# Patient Record
Sex: Female | Born: 1955 | Race: Black or African American | Hispanic: No | Marital: Married | Smoking: Never smoker
Health system: Southern US, Community
[De-identification: ages and names within clinical notes are randomized; demographics above are authoritative.]

## PROBLEM LIST (undated history)

## (undated) DIAGNOSIS — E669 Obesity, unspecified: Secondary | ICD-10-CM

## (undated) DIAGNOSIS — E785 Hyperlipidemia, unspecified: Secondary | ICD-10-CM

## (undated) DIAGNOSIS — G709 Myoneural disorder, unspecified: Secondary | ICD-10-CM

## (undated) DIAGNOSIS — I10 Essential (primary) hypertension: Secondary | ICD-10-CM

## (undated) DIAGNOSIS — E119 Type 2 diabetes mellitus without complications: Secondary | ICD-10-CM

## (undated) HISTORY — DX: Myoneural disorder, unspecified: G70.9

## (undated) HISTORY — DX: Obesity, unspecified: E66.9

## (undated) HISTORY — PX: CHOLECYSTECTOMY: SHX55

---

## 2000-02-04 DIAGNOSIS — E1159 Type 2 diabetes mellitus with other circulatory complications: Secondary | ICD-10-CM | POA: Insufficient documentation

## 2000-02-04 DIAGNOSIS — I152 Hypertension secondary to endocrine disorders: Secondary | ICD-10-CM | POA: Insufficient documentation

## 2005-12-03 DIAGNOSIS — D649 Anemia, unspecified: Secondary | ICD-10-CM | POA: Insufficient documentation

## 2005-12-25 DIAGNOSIS — G4700 Insomnia, unspecified: Secondary | ICD-10-CM | POA: Insufficient documentation

## 2006-01-01 ENCOUNTER — Ambulatory Visit: Payer: Self-pay | Admitting: Family Medicine

## 2006-01-08 ENCOUNTER — Ambulatory Visit: Payer: Self-pay | Admitting: Family Medicine

## 2006-07-22 ENCOUNTER — Ambulatory Visit: Payer: Self-pay | Admitting: Family Medicine

## 2007-08-03 ENCOUNTER — Ambulatory Visit: Payer: Self-pay | Admitting: Family Medicine

## 2008-07-17 ENCOUNTER — Emergency Department: Payer: Self-pay | Admitting: Internal Medicine

## 2010-03-08 DIAGNOSIS — E7800 Pure hypercholesterolemia, unspecified: Secondary | ICD-10-CM | POA: Insufficient documentation

## 2010-03-28 ENCOUNTER — Ambulatory Visit: Payer: Self-pay

## 2010-03-28 LAB — HM MAMMOGRAPHY

## 2010-04-02 ENCOUNTER — Ambulatory Visit: Payer: Self-pay | Admitting: Gastroenterology

## 2010-04-02 LAB — HM COLONOSCOPY

## 2010-04-29 ENCOUNTER — Emergency Department: Payer: Self-pay | Admitting: Emergency Medicine

## 2010-08-17 ENCOUNTER — Ambulatory Visit: Payer: Self-pay

## 2010-08-30 ENCOUNTER — Ambulatory Visit: Payer: Self-pay | Admitting: Anesthesiology

## 2010-08-31 ENCOUNTER — Ambulatory Visit: Payer: Self-pay | Admitting: Unknown Physician Specialty

## 2012-03-17 ENCOUNTER — Emergency Department: Payer: Self-pay | Admitting: Emergency Medicine

## 2012-04-10 ENCOUNTER — Ambulatory Visit: Payer: Self-pay | Admitting: Orthopedic Surgery

## 2012-08-25 HISTORY — PX: REPLACEMENT TOTAL KNEE: SUR1224

## 2012-09-10 ENCOUNTER — Ambulatory Visit: Payer: Self-pay | Admitting: Orthopedic Surgery

## 2012-09-10 LAB — CBC
HCT: 37.9 % (ref 35.0–47.0)
HGB: 12.3 g/dL (ref 12.0–16.0)
MCH: 27.5 pg (ref 26.0–34.0)
MCHC: 32.6 g/dL (ref 32.0–36.0)
MCV: 84 fL (ref 80–100)
Platelet: 250 10*3/uL (ref 150–440)
RBC: 4.49 10*6/uL (ref 3.80–5.20)
RDW: 14.5 % (ref 11.5–14.5)
WBC: 5.2 10*3/uL (ref 3.6–11.0)

## 2012-09-10 LAB — MRSA PCR SCREENING

## 2012-09-10 LAB — BASIC METABOLIC PANEL
Anion Gap: 9 (ref 7–16)
BUN: 14 mg/dL (ref 7–18)
Calcium, Total: 9.6 mg/dL (ref 8.5–10.1)
Chloride: 103 mmol/L (ref 98–107)
Co2: 28 mmol/L (ref 21–32)
Creatinine: 0.89 mg/dL (ref 0.60–1.30)
EGFR (African American): >60
EGFR (Non-African Amer.): >60
Glucose: 94 mg/dL (ref 65–99)
Osmolality: 280 (ref 275–301)
Potassium: 3.8 mmol/L (ref 3.5–5.1)
Sodium: 140 mmol/L (ref 136–145)

## 2012-09-10 LAB — PROTIME-INR
INR: 1
Prothrombin Time: 13.1 secs (ref 11.5–14.7)

## 2012-09-10 LAB — APTT: Activated PTT: 28.4 secs (ref 23.6–35.9)

## 2012-09-10 LAB — SEDIMENTATION RATE: Erythrocyte Sed Rate: 24 mm/hr (ref 0–30)

## 2012-09-22 ENCOUNTER — Inpatient Hospital Stay: Payer: Self-pay | Admitting: Orthopedic Surgery

## 2012-09-23 LAB — HEMOGLOBIN: HGB: 10.4 g/dL — ABNORMAL LOW (ref 12.0–16.0)

## 2012-09-23 LAB — BASIC METABOLIC PANEL
Anion Gap: 9 (ref 7–16)
BUN: 14 mg/dL (ref 7–18)
Calcium, Total: 8.8 mg/dL (ref 8.5–10.1)
Chloride: 102 mmol/L (ref 98–107)
Co2: 27 mmol/L (ref 21–32)
Creatinine: 0.92 mg/dL (ref 0.60–1.30)
EGFR (African American): >60
EGFR (Non-African Amer.): >60
Glucose: 135 mg/dL — ABNORMAL HIGH (ref 65–99)
Osmolality: 278 (ref 275–301)
Potassium: 4.1 mmol/L (ref 3.5–5.1)
Sodium: 138 mmol/L (ref 136–145)

## 2012-09-23 LAB — PLATELET COUNT: Platelet: 218 10*3/uL (ref 150–440)

## 2012-09-24 LAB — HEMOGLOBIN: HGB: 11 g/dL — ABNORMAL LOW (ref 12.0–16.0)

## 2012-09-25 LAB — PATHOLOGY REPORT

## 2012-10-15 ENCOUNTER — Ambulatory Visit: Payer: Self-pay | Admitting: Orthopedic Surgery

## 2013-04-08 LAB — HM PAP SMEAR: HM Pap smear: NEGATIVE

## 2015-03-14 NOTE — Op Note (Signed)
PATIENT NAME:  Julia Robertson, Julia Robertson MR#:  [id number] DATE OF BIRTH:  Apr 11, 1956  DATE OF PROCEDURE:  10/15/2012  PREOPERATIVE DIAGNOSIS: Right knee arthrofibrosis post total knee.   POSTOPERATIVE DIAGNOSIS: Right knee arthrofibrosis post total knee.   PROCEDURE: Manipulation right knee.   ANESTHESIA: General.  SURGEON: Laurene Footman, MD  DESCRIPTION OF PROCEDURE: Patient was brought to the Operating Room and after adequate anesthesia was obtained, appropriate patient identification and timeout procedures were completed. Initial examination revealed range of motion of 10 to 65 degrees. First the leg was placed out into extension and extension was obtained to about 3 degrees. Gentle flexion was carried out and there was palpable popping of adhesions and flexion was brought back to 105 degrees. The knee was stable to exam. Following manipulation the patient was woken up and sent to recovery room in stable condition. There is no blood loss. No complications. No specimen.  ____________________________ Laurene Footman, MD mjm:cms D: 10/16/2012 00:14:30 ET T: 10/16/2012 09:53:36 ET JOB#: [id number]  cc: Laurene Footman, MD, <Dictator>  Laurene Footman MD ELECTRONICALLY SIGNED 10/16/2012 12:26

## 2015-03-14 NOTE — Discharge Summary (Signed)
PATIENT NAME:  Julia Robertson, Julia Robertson MR#:  [id number] DATE OF BIRTH:  Dec 27, 1955  DATE OF ADMISSION:  09/22/2012 DATE OF DISCHARGE:  09/25/2012   ADMITTING DIAGNOSIS: Right knee severe osteoarthritis.   DISCHARGE DIAGNOSIS: Right knee severe osteoarthritis.   PROCEDURE: Right total knee replacement.   SURGEON: Laurene Footman, MD    ASSISTANT: Rachelle Hora, PA-C    ANESTHESIA: General.   COMPLICATIONS: None.   SPECIMEN: Cut ends of bone.   TOURNIQUET TIME: 76 minutes at 300 mmHg.   ESTIMATED BLOOD LOSS: 100 mL.  IMPLANTS: Medacta GMK primary size 2 10 mm standard tibial insert with a right size 3 standard femoral component, a size 2 right fixed cemented tibial tray, and a size 2 GMK primary patellar component.   CONDITION: To recovery room stable.   HISTORY: The patient is a 59 year old female who has had a greater than three year history of significant right knee pain. X-rays of the patient's right knee as well as CT with severe degenerative changes within the right knee. Despite cortisone injections, Synvisc injections, as well as pain medications, the patient has been unable to have relief with her knee pain. Her knee pain limits her activities of daily living and her ability to work and be productive. The patient has pain with rest as well as with activity. Pain averages around 8 out of 10. She has agreed and consented to a right total knee replacement with Dr. Rudene Christians.    PHYSICAL EXAMINATION: GENERAL: Well developed, well nourished female in no apparent distress. Normal affect. Slow gait with mild antalgic component right lower extremity. HEENT: Head is normocephalic, atraumatic. Pupils equal, round, and reactive to light. HEART: Regular rate and rhythm. LUNGS: Clear to auscultation. No wheezing, rales, rhonchi, RIGHT LOWER EXTREMITY: Examination of the right knee shows the patient has 10 to 100 degrees of motion with significant amount of crepitus. The patient has a slight varus  abnormality. The patient is tender over the medial and lateral joint lines. She has no laxity within the right knee. She has no posterior joint effusion. She has no swelling, warmth, or erythema. The patient is neurovascularly intact in the right lower extremity.   HOSPITAL COURSE: The patient was admitted to the hospital on 09/22/2012. She had surgery that same day and was brought to the orthopedic floor from the PAC-U unit in stable condition. The patient on postop day one was monitored via vital signs and lab work. The patient's lab work remained stable. She progressed very well with physical therapy throughout her stay. On postop day three, the patient had a bowel movement and vital signs and lab work were stable and she was ready for discharge to a rehab facility to continue to progress with physical therapy.   CONDITION AT DISCHARGE: Stable.   DISCHARGE INSTRUCTIONS:  1. The patient should schedule a follow-up appointment with [facility] in two weeks.  2. Activity should consist of weightbearing as tolerated.  3. She should wear TED hose, thigh high, bilaterally for the next six weeks. 4. She should use incentive spirometry every one hour and cough and deep breathe every two hours.  5. She should continue using the [facility] Care unit maintaining temperature between 40 and 50 degrees. 6. Physical therapy should be consulted and the patient should be evaluated and treated for difficulty walking.  7. OT should evaluate and treat the patient for muscle weakness and assistance with activities of daily living.  8. She can resume a regular diet.  DISCHARGE MEDICATIONS:  1. Tylenol 500 to 1000 mg oral q.4 hours p.r.n. pain or temp greater than 100.4.  2. Oxycodone 5 to 10 mg oral q.4 hours p.r.n. pain.  3. Zolpidem 5 mg oral at bedtime p.r.n. insomnia.  4. Dulcolax 10 mg rectal daily p.r.n. constipation.  5. Milk of Magnesia 30 mL oral b.i.d. p.r.n. constipation.  6. Enema soapsuds  daily.  7. Alum-max hydroxide with simethicone 400/400/40 mg/5 mL suspension 30 mL oral q.6 hours p.r.n. indigestion or heartburn.  8. Senokot 1 tablet oral b.i.d.  9. HCTZ/triamterene 25/37.5 mg tablet 1 tablet oral q.a.m.  10. Xarelto 10 mg q.a.m. x9 days.   ____________________________ Duanne Guess, PA-C tcg:drc D: 09/25/2012 08:10:00 ET T: 09/25/2012 09:13:03 ET JOB#: [id number]  cc: Duanne Guess, PA-C, <Dictator> Duanne Guess PA ELECTRONICALLY SIGNED 10/01/2012 12:20

## 2015-03-14 NOTE — Op Note (Signed)
PATIENT NAME:  Julia Robertson, Julia Robertson MR#:  [id number] DATE OF BIRTH:  Sep 30, 1956  DATE OF PROCEDURE:  09/22/2012  PREOPERATIVE DIAGNOSIS: Severe right knee osteoarthritis.   POSTOPERATIVE DIAGNOSIS: Severe right knee osteoarthritis.  PROCEDURE: Right total knee replacement.   SURGEON: Laurene Footman, MD   ASSISTANT: Rachelle Hora, PA-C   ANESTHESIA: General.   DESCRIPTION OF PROCEDURE: The patient was brought to the operating room and after spinal anesthesia was unsuccessful general anesthesia was applied. The right leg was prepped and draped in the usual sterile fashion with a tourniquet applied to the upper thigh and Alvarado legholder also being utilized to maintain knee flexion during the procedure. After prepping and draping, appropriate patient identification and time-out procedures were completed, the tourniquet was raised to 300 mmHg. A midline skin incision was made with the knee in flexion followed by a medial parapatellar arthrotomy. Inspection of the knee revealed extensive tricompartmental osteoarthritis with no normal appearing articular cartilage. The ACL was excised along with the anterior horns of the meniscus. The proximal tibia was exposed for the Medacta cutting block. When this was applied, it appeared to be the appropriate position. Pins were placed to hold it in place and the proximal tibia cut was carried out. After removing this bone, the femur was exposed with cartilage removed to allow for placement of the cutting block, the alignment guide for the Medacta custom block set. This was applied, drill holes made, and the distal cut matching the preop template. Cutting block was applied #3 for the femur. Anterior, posterior, and chamfer cuts carried out. The residual meniscus was removed at this point. The tibia size 2 plate was applied. This appeared appropriate. It was placed in the appropriate rotation based on one of the initial pins through the tibial cutting block and the  proximal drill hole made followed by the notch cut with the trials placed at this time. 10 mm standard tibial implant with a 3 standard femur gave excellent stability in flexion, extension, mid flexion and these were subsequently chosen as the final components. Drill holes were made for the femoral trial at this time as well as the notch cut in the trochlea. The patella was cut using the patellar cutting guide and after drill holes were made sized to a size 2. These components were then opened. The knee was thoroughly irrigated with pulsatile lavage and a combination of 0.25% Sensorcaine with epinephrine 30 mL 10 mg of morphine and Toradol along with saline was infiltrated into the posterior capsule and anterior capsule to aid in postop analgesia. At this point the bony surfaces having been thoroughly irrigated and dried, the tibial component was cemented in place first with excess cement removed followed by placement of the polyethylene component. The femoral component was then impacted into place and the knee held in extension while the patellar button was clamped into place with all components being cemented. After excess cement was removed and the knee was again thoroughly irrigated, the patella tracked well with no touch technique and there was excellent stability with full extension with elevating the foot. The tourniquet was let down and hemostasis checked with electrocautery. The arthrotomy was closed using a heavy quill suture followed by 2-0 quill subcutaneously followed by skin staples. Xeroform, 4 x 4, ABD, Webril, and Ace wrap were applied along with a knee immobilizer. The patient tolerated the procedure well.   COMPLICATIONS: None.   SPECIMEN: Cut ends of bone.   TOURNIQUET TIME: 76 minutes at 300 mmHg.  ESTIMATED BLOOD LOSS: 100 mL.  IMPLANTS: Medacta GMK primary size 2 10 mm standard tibial insert with a right size 3 standard femoral component, a size 2 right fixed cemented tibial tray,  and a size 2 GMK primary patellar component.   CONDITION: To recovery room stable.   ____________________________ Laurene Footman, MD mjm:drc D: 09/22/2012 20:46:52 ET T: 09/23/2012 09:13:59 ET JOB#: [id number]  cc: Laurene Footman, MD, <Dictator> Laurene Footman MD ELECTRONICALLY SIGNED 09/23/2012 12:37

## 2015-09-22 ENCOUNTER — Other Ambulatory Visit: Payer: Self-pay | Admitting: Family Medicine

## 2015-09-22 DIAGNOSIS — I10 Essential (primary) hypertension: Secondary | ICD-10-CM

## 2015-09-22 DIAGNOSIS — R739 Hyperglycemia, unspecified: Secondary | ICD-10-CM | POA: Insufficient documentation

## 2015-09-22 NOTE — Telephone Encounter (Signed)
Last OV 03/2013  Thanks,   -Laura  

## 2015-10-10 ENCOUNTER — Ambulatory Visit (INDEPENDENT_AMBULATORY_CARE_PROVIDER_SITE_OTHER): Payer: BLUE CROSS/BLUE SHIELD | Admitting: Family Medicine

## 2015-10-10 ENCOUNTER — Encounter: Payer: Self-pay | Admitting: Family Medicine

## 2015-10-10 VITALS — BP 132/108 | HR 84 | Temp 98.6°F | Resp 16 | Ht 64.0 in | Wt 280.0 lb

## 2015-10-10 DIAGNOSIS — Z1211 Encounter for screening for malignant neoplasm of colon: Secondary | ICD-10-CM | POA: Diagnosis not present

## 2015-10-10 DIAGNOSIS — I10 Essential (primary) hypertension: Secondary | ICD-10-CM | POA: Diagnosis not present

## 2015-10-10 DIAGNOSIS — M25569 Pain in unspecified knee: Secondary | ICD-10-CM | POA: Insufficient documentation

## 2015-10-10 DIAGNOSIS — D649 Anemia, unspecified: Secondary | ICD-10-CM | POA: Diagnosis not present

## 2015-10-10 DIAGNOSIS — G4700 Insomnia, unspecified: Secondary | ICD-10-CM | POA: Diagnosis not present

## 2015-10-10 DIAGNOSIS — E7800 Pure hypercholesterolemia, unspecified: Secondary | ICD-10-CM

## 2015-10-10 DIAGNOSIS — M25561 Pain in right knee: Secondary | ICD-10-CM

## 2015-10-10 DIAGNOSIS — Z1231 Encounter for screening mammogram for malignant neoplasm of breast: Secondary | ICD-10-CM

## 2015-10-10 DIAGNOSIS — R7309 Other abnormal glucose: Secondary | ICD-10-CM

## 2015-10-10 DIAGNOSIS — R739 Hyperglycemia, unspecified: Secondary | ICD-10-CM

## 2015-10-10 DIAGNOSIS — E559 Vitamin D deficiency, unspecified: Secondary | ICD-10-CM | POA: Insufficient documentation

## 2015-10-10 MED ORDER — TRAZODONE HCL 100 MG PO TABS: 100.0000 mg | ORAL_TABLET | Freq: Every day | ORAL | Status: DC

## 2015-10-10 MED ORDER — TRIAMTERENE-HCTZ 37.5-25 MG PO TABS: 1.0000 | ORAL_TABLET | Freq: Every day | ORAL | Status: DC

## 2015-10-10 MED ORDER — TRAMADOL HCL 50 MG PO TABS: 50.0000 mg | ORAL_TABLET | Freq: Four times a day (QID) | ORAL | Status: DC | PRN

## 2015-10-10 NOTE — Progress Notes (Signed)
Subjective:     Patient ID: Julia Robertson, female   DOB: 1956/08/01, 59 y.o.   MRN: [id number]  Chief Complaint  Patient presents with  . Hypertension  . Insomnia  . Joint Pain    Hypertension This is a chronic problem. The current episode started more than 1 year ago. The problem is unchanged. Pertinent negatives include no anxiety, blurred vision, chest pain, headaches, orthopnea, palpitations, peripheral edema, PND or shortness of breath. (Ankle swelling, pt thinks due to standing on her feet all day.) There are no associated agents to hypertension. Risk factors for coronary artery disease include post-menopausal state, obesity and sedentary lifestyle. Past treatments include diuretics. The current treatment provides significant (Highest BP before meds was 240s/130s) improvement. There are no compliance problems (no side effects).  There is no history of angina, kidney disease, CAD/MI, CVA, heart failure, PVD or retinopathy.  Pt was taking triamterene-HCTZ daily as prescribed, which ran out last week. 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 Gets her BP checked every other week, and her BPs are usually 140s/80s.  Insomnia Chronic problem over years. Was taking trazodone, which helped her sleep. She sleeps around 11pm and wakes up around 3am, unable to fall back asleep. Denies drinking coffee, but does drink an occasional soda. Reports mood is great and enjoys life.  Would like this medication refilled.     Joint Pain  Left knee pain, chronic, seen by orthopedics for surgery. S/p total knee replacement R knee in 2013, no complaints on R knee. Bilateral hip pain especially with walking. Pt was taking tramadol, which she also ran out.   Health Maintenance Colonoscopy 2011 - 3 polyps. Recommended colonoscopy in 5 years - due 2016.  Per pt, due for mammogram, Pap smear Received flu shot this season. Up to date on Tdap from from work.   Review of Systems   Constitutional: Negative for fever, activity change, appetite change, fatigue and unexpected weight change.  HENT: Negative.   Eyes: Negative for blurred vision.  Respiratory: Negative for cough and shortness of breath.   Cardiovascular: Negative for chest pain, palpitations, orthopnea and PND.  Gastrointestinal: Negative.   Endocrine: Negative.   Genitourinary: Negative.   Musculoskeletal: Positive for joint swelling (left knee) and arthralgias.  Skin: Negative.   Neurological: Negative for dizziness, light-headedness and headaches.  Hematological: Negative.   Psychiatric/Behavioral: Negative.    Patient Active Problem List   Diagnosis Date Noted  . Avitaminosis D 10/10/2015  . Encounter for screening colonoscopy 10/10/2015  . Insomnia 10/10/2015  . Knee pain 10/10/2015  . Elevated blood sugar 09/22/2015  . Hypercholesterolemia 03/08/2010  . Morbid obesity ([place]) 02/02/2010  . Cannot sleep 12/25/2005  . Absolute anemia 12/03/2005  . BP (high blood pressure) 02/04/2000   Previous Medications   BIOTIN PO    Take by mouth.   MULTIPLE VITAMIN (MULTIVITAMIN) CAPSULE    Take 1 capsule by mouth daily.   No Known Allergies Past Surgical History  Procedure Laterality Date  . 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 Rudene Christians at [facility]  . Cholecystectomy     No family history on file. Social History   Social History  . Marital Status: Married    Spouse Name: N/A  . Number of Children: N/A  . Years of Education: N/A   Occupational History  . 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 Smoking  status: Never Smoker   . Smokeless tobacco: Never Used  . Alcohol Use: No  . Drug Use: No  . Sexual Activity: Not on file   Other Topics Concern  . Not on file   Social History Narrative  Never smoker, no alcohol or illicit drug use. Has positive support system with her family. She goes on yearly cruises with her family, last in the  [place] recently. She has a positive outlook on life, and has positive body image.       Objective:   Physical Exam  Constitutional: She is oriented to person, place, and time. She appears well-developed and well-nourished. No distress.  HENT:  Head: Normocephalic and atraumatic.  Right Ear: External ear normal.  Left Ear: External ear normal.  Nose: Nose normal.  Mouth/Throat: Oropharynx is clear and moist. No oropharyngeal exudate.  Eyes: EOM are normal. Right eye exhibits no discharge. Left eye exhibits no discharge.  Neck: Normal range of motion.  Cardiovascular: Normal rate, regular rhythm and normal heart sounds.  Exam reveals no gallop and no friction rub.   No murmur heard. Pulmonary/Chest: Effort normal and breath sounds normal. No respiratory distress. She has no wheezes.  Abdominal: Soft. Bowel sounds are normal. She exhibits no distension. There is no tenderness.  Musculoskeletal: Normal range of motion. She exhibits no edema.  Lymphadenopathy:    She has no cervical adenopathy.  Neurological: She is alert and oriented to person, place, and time. She displays normal reflexes. No cranial nerve deficit. She exhibits normal muscle tone. Coordination normal.  Skin: Skin is warm and dry. She is not diaphoretic.  Psychiatric: She has a normal mood and affect. Her behavior is normal. Judgment and thought content normal.    BP 132/108 mmHg  Pulse 84  Temp(Src) 98.6 F (37 C) (Oral)  Resp 16  Ht 5\' 4"  (1.626 m)  Wt 280 lb (127.007 kg)  BMI 48.04 kg/m2     Assessment:     Julia Robertson is a 59yo F with HTN, HLD, insomnia who presents for refill of hypertension medications. In the office today, blood pressure 132/108. Is not up to date on health maintenance labs and screening. Will treat as below.       Plan:     1. Essential hypertension Condition is stable. Please continue current medication and  plan of care as noted.  Will check labs.   - Comprehensive metabolic  panel - TSH - triamterene-hydrochlorothiazide (MAXZIDE-25) 37.5-25 MG tablet; Take 1 tablet by mouth daily.  Dispense: 30 tablet; Refill: 5  2. H/o Anemia, unspecified anemia type - CBC  3. H/o Hypercholesterolemia Will check labs.  - Lipid panel, fasting  4. H/o Elevated blood sugar - Hemoglobin A1C  5. Encounter for screening colonoscopy - last colonoscopy 2011, polyps found, recommended repeat colonoscopy 2016 - Ambulatory referral to Gastroenterology  6. Insomnia Condition is stable. Please continue current medication and  plan of care as noted.   - traZODone (DESYREL) 100 MG tablet; Take 1 tablet (100 mg total) by mouth at bedtime.  Dispense: 30 tablet; Refill: 11  7. Right knee pain Will refill pain medication.   - traMADol (ULTRAM) 50 MG tablet; Take 1 tablet (50 mg total) by mouth every 6 (six) hours as needed.  Dispense: 60 tablet; Refill: 3  8. Encounter for screening mammogram for breast cancer - MM Digital Screening; Future  9. Other health maintenance - Schedule physical, Pap smear  Margarita Rana, MD

## 2015-10-24 ENCOUNTER — Telehealth: Payer: Self-pay | Admitting: Gastroenterology

## 2015-10-24 NOTE — Telephone Encounter (Signed)
Gastroenterology Pre-Procedure Review  Request Date: 12-05-2015 Requesting Physician: Dr.   PATIENT REVIEW QUESTIONS: The patient responded to the following health history questions as indicated:    1. Are you having any GI issues? no 2. Do you have a personal history of Polyps? yes ( ) 3. Do you have a family history of Colon Cancer or Polyps? no 4. Diabetes Mellitus? no 5. Joint replacements in the past 12 months?no 6. Major health problems in the past 3 months?no 7. Any artificial heart valves, MVP, or defibrillator?no    MEDICATIONS & ALLERGIES:    Patient reports the following regarding taking any anticoagulation/antiplatelet therapy:   Plavix, Coumadin, Eliquis, Xarelto, Lovenox, Pradaxa, Brilinta, or Effient? no Aspirin? no  Patient confirms/reports the following medications:  Current Outpatient Prescriptions  Medication Sig Dispense Refill   BIOTIN PO Take by mouth.     Multiple Vitamin (MULTIVITAMIN) capsule Take 1 capsule by mouth daily.     traMADol (ULTRAM) 50 MG tablet Take 1 tablet (50 mg total) by mouth every 6 (six) hours as needed. 60 tablet 3   traZODone (DESYREL) 100 MG tablet Take 1 tablet (100 mg total) by mouth at bedtime. 30 tablet 11   triamterene-hydrochlorothiazide (MAXZIDE-25) 37.5-25 MG tablet Take 1 tablet by mouth daily. 30 tablet 5   No current facility-administered medications for this visit.    Patient confirms/reports the following allergies:  No Known Allergies  No orders of the defined types were placed in this encounter.    AUTHORIZATION INFORMATION Primary Insurance: 1D#: Group #:  Secondary Insurance: 1D#: Group #:  SCHEDULE INFORMATION: Date: 12-05-2015 Time: Location:[facility]

## 2015-10-25 ENCOUNTER — Other Ambulatory Visit: Payer: Self-pay

## 2015-11-10 ENCOUNTER — Ambulatory Visit
Admission: RE | Admit: 2015-11-10 | Discharge: 2015-11-10 | Disposition: A | Discharge status: Home / Self Care | Payer: BLUE CROSS/BLUE SHIELD | Source: Ambulatory Visit | Attending: Family Medicine | Admitting: Family Medicine

## 2015-11-10 DIAGNOSIS — Z1231 Encounter for screening mammogram for malignant neoplasm of breast: Secondary | ICD-10-CM | POA: Insufficient documentation

## 2015-11-11 LAB — LIPID PANEL
Chol/HDL Ratio: 4.8 ratio units — ABNORMAL HIGH (ref 0.0–4.4)
Cholesterol, Total: 205 mg/dL — ABNORMAL HIGH (ref 100–199)
HDL: 43 mg/dL (ref >39)
LDL Calculated: 134 mg/dL — ABNORMAL HIGH (ref 0–99)
Triglycerides: 139 mg/dL (ref 0–149)
VLDL Cholesterol Cal: 28 mg/dL (ref 5–40)

## 2015-11-11 LAB — CBC
Hematocrit: 39.2 % (ref 34.0–46.6)
Hemoglobin: 12.8 g/dL (ref 11.1–15.9)
MCH: 26.9 pg (ref 26.6–33.0)
MCHC: 32.7 g/dL (ref 31.5–35.7)
MCV: 82 fL (ref 79–97)
Platelets: 256 10*3/uL (ref 150–379)
RBC: 4.76 x10E6/uL (ref 3.77–5.28)
RDW: 13.4 % (ref 12.3–15.4)
WBC: 4.9 10*3/uL (ref 3.4–10.8)

## 2015-11-11 LAB — HEMOGLOBIN A1C
Est. average glucose Bld gHb Est-mCnc: 171 mg/dL
Hgb A1c MFr Bld: 7.6 % — ABNORMAL HIGH (ref 4.8–5.6)

## 2015-11-11 LAB — COMPREHENSIVE METABOLIC PANEL
ALT: 52 IU/L — ABNORMAL HIGH (ref 0–32)
AST: 43 IU/L — ABNORMAL HIGH (ref 0–40)
Albumin/Globulin Ratio: 1 — ABNORMAL LOW (ref 1.1–2.5)
Albumin: 4.1 g/dL (ref 3.5–5.5)
Alkaline Phosphatase: 78 IU/L (ref 39–117)
BUN/Creatinine Ratio: 11 (ref 9–23)
BUN: 9 mg/dL (ref 6–24)
Bilirubin Total: 0.5 mg/dL (ref 0.0–1.2)
CO2: 27 mmol/L (ref 18–29)
Calcium: 9.8 mg/dL (ref 8.7–10.2)
Chloride: 94 mmol/L — ABNORMAL LOW (ref 96–106)
Creatinine, Ser: 0.79 mg/dL (ref 0.57–1.00)
GFR calc Af Amer: 96 mL/min/{1.73_m2} (ref >59)
GFR calc non Af Amer: 83 mL/min/{1.73_m2} (ref >59)
Globulin, Total: 4 g/dL (ref 1.5–4.5)
Glucose: 140 mg/dL — ABNORMAL HIGH (ref 65–99)
Potassium: 3.4 mmol/L — ABNORMAL LOW (ref 3.5–5.2)
Sodium: 137 mmol/L (ref 134–144)
Total Protein: 8.1 g/dL (ref 6.0–8.5)

## 2015-11-11 LAB — TSH: TSH: 1.68 u[IU]/mL (ref 0.450–4.500)

## 2015-11-13 ENCOUNTER — Other Ambulatory Visit: Payer: Self-pay

## 2015-11-13 DIAGNOSIS — E119 Type 2 diabetes mellitus without complications: Secondary | ICD-10-CM | POA: Insufficient documentation

## 2015-11-13 DIAGNOSIS — R748 Abnormal levels of other serum enzymes: Secondary | ICD-10-CM | POA: Insufficient documentation

## 2015-11-13 DIAGNOSIS — E876 Hypokalemia: Secondary | ICD-10-CM

## 2015-11-13 MED ORDER — METFORMIN HCL 500 MG PO TABS
500.0000 mg | ORAL_TABLET | Freq: Two times a day (BID) | ORAL | Status: DC
Start: 2015-11-13 — End: 2016-04-22

## 2015-11-13 NOTE — Telephone Encounter (Signed)
-----  Message from Margarita Rana, MD sent at 11/11/2015  8:07 AM EST ----- Labs show now has diabetes. Would recommend eat healthy,increase exercise,work on weight loss and start metformin 500 mg bid. Recheck HgbA1c in 3 months.    Also with low potassium and increased liver enzymes. Need to recheck met c in one week to make sure really is low.  Eat more potassium rich foods like spinach, sweet potatoes, avocados and bananas.  Also, liver enzymes up as noted. Has this been issue before? Can be weight related.  May go down on it's own, but if not, does need work up . Thanks.

## 2015-11-13 NOTE — Telephone Encounter (Signed)
Pt advised; RX sent to [facility]  I also printed out a lab sheet for next week, she reports not having trouble with Liver Enzymes before.   Thanks,   -Mickel Baas

## 2015-11-24 ENCOUNTER — Encounter: Payer: Self-pay | Admitting: *Deleted

## 2015-11-28 ENCOUNTER — Ambulatory Visit
Admission: RE | Admit: 2015-11-28 | Discharge: 2015-11-28 | Disposition: A | Discharge status: Home / Self Care | Payer: BLUE CROSS/BLUE SHIELD | Source: Ambulatory Visit | Attending: Gastroenterology | Admitting: Gastroenterology

## 2015-11-28 ENCOUNTER — Ambulatory Visit: Payer: BLUE CROSS/BLUE SHIELD | Admitting: Certified Registered Nurse Anesthetist

## 2015-11-28 ENCOUNTER — Encounter
Admission: RE | Disposition: A | Discharge status: Home / Self Care | Payer: Self-pay | Source: Ambulatory Visit | Attending: Gastroenterology

## 2015-11-28 ENCOUNTER — Encounter: Payer: Self-pay | Admitting: *Deleted

## 2015-11-28 DIAGNOSIS — Z860100 Personal history of colon polyps, unspecified: Secondary | ICD-10-CM | POA: Insufficient documentation

## 2015-11-28 DIAGNOSIS — E119 Type 2 diabetes mellitus without complications: Secondary | ICD-10-CM | POA: Insufficient documentation

## 2015-11-28 DIAGNOSIS — D124 Benign neoplasm of descending colon: Secondary | ICD-10-CM | POA: Diagnosis not present

## 2015-11-28 DIAGNOSIS — Z96651 Presence of right artificial knee joint: Secondary | ICD-10-CM | POA: Insufficient documentation

## 2015-11-28 DIAGNOSIS — Z1211 Encounter for screening for malignant neoplasm of colon: Secondary | ICD-10-CM | POA: Insufficient documentation

## 2015-11-28 DIAGNOSIS — Z7984 Long term (current) use of oral hypoglycemic drugs: Secondary | ICD-10-CM | POA: Insufficient documentation

## 2015-11-28 DIAGNOSIS — I10 Essential (primary) hypertension: Secondary | ICD-10-CM | POA: Insufficient documentation

## 2015-11-28 DIAGNOSIS — K641 Second degree hemorrhoids: Secondary | ICD-10-CM | POA: Insufficient documentation

## 2015-11-28 DIAGNOSIS — Z79899 Other long term (current) drug therapy: Secondary | ICD-10-CM | POA: Insufficient documentation

## 2015-11-28 DIAGNOSIS — D122 Benign neoplasm of ascending colon: Secondary | ICD-10-CM | POA: Insufficient documentation

## 2015-11-28 HISTORY — PX: COLONOSCOPY WITH PROPOFOL: SHX5780

## 2015-11-28 HISTORY — DX: Essential (primary) hypertension: I10

## 2015-11-28 HISTORY — DX: Type 2 diabetes mellitus without complications: E11.9

## 2015-11-28 LAB — GLUCOSE, CAPILLARY: Glucose-Capillary: 146 mg/dL — ABNORMAL HIGH (ref 65–99)

## 2015-11-28 SURGERY — COLONOSCOPY WITH PROPOFOL
Anesthesia: General

## 2015-11-28 MED ORDER — PROPOFOL 500 MG/50ML IV EMUL
INTRAVENOUS | Status: DC | PRN
  Administered 2015-11-28: 140 ug/kg/min via INTRAVENOUS

## 2015-11-28 MED ORDER — LIDOCAINE HCL (CARDIAC) 20 MG/ML IV SOLN
INTRAVENOUS | Status: DC | PRN
  Administered 2015-11-28: 60 mg via INTRAVENOUS

## 2015-11-28 MED ORDER — PROPOFOL 10 MG/ML IV BOLUS
INTRAVENOUS | Status: DC | PRN
  Administered 2015-11-28: 60 mg via INTRAVENOUS

## 2015-11-28 MED ORDER — SODIUM CHLORIDE 0.9 % IV SOLN
INTRAVENOUS | Status: DC
  Administered 2015-11-28: 07:00:00 via INTRAVENOUS

## 2015-11-28 NOTE — Anesthesia Preprocedure Evaluation (Signed)
Anesthesia Evaluation  Patient identified by MRN, date of birth, ID band Patient awake    Reviewed: Allergy & Precautions, H&P , NPO status , Patient's Chart, lab work & pertinent test results  History of Anesthesia Complications Negative for: history of anesthetic complications  Airway Mallampati: III  TM Distance: >3 FB Neck ROM: full    Dental  (+) Poor Dentition, Chipped   Pulmonary neg pulmonary ROS, neg shortness of breath,    Pulmonary exam normal breath sounds clear to auscultation       Cardiovascular Exercise Tolerance: Good hypertension, (-) angina(-) Past MI and (-) DOE Normal cardiovascular exam Rhythm:regular Rate:Normal     Neuro/Psych negative neurological ROS  negative psych ROS   GI/Hepatic negative GI ROS, Neg liver ROS,   Endo/Other  diabetes, Type 2, Oral Hypoglycemic Agents  Renal/GU negative Renal ROS  negative genitourinary   Musculoskeletal   Abdominal   Peds  Hematology negative hematology ROS (+)   Anesthesia Other Findings Past Medical History:   Hypertension                                                 Diabetes mellitus without complication ([place])                Past Surgical History:   REPLACEMENT TOTAL KNEE                          Right 08/2012        Comment:Dr. Rudene Christians at [place]                                              BMI    Body Mass Index   48.03 kg/m 2      Reproductive/Obstetrics negative OB ROS                             Anesthesia Physical Anesthesia Plan  ASA: III  Anesthesia Plan: General   Post-op Pain Management:    Induction:   Airway Management Planned:   Additional Equipment:   Intra-op Plan:   Post-operative Plan:   Informed Consent: I have reviewed the patients History and Physical, chart, labs and discussed the procedure including the risks, benefits and alternatives for the proposed anesthesia  with the patient or authorized representative who has indicated his/her understanding and acceptance.   Dental Advisory Given  Plan Discussed with: Anesthesiologist, CRNA and Surgeon  Anesthesia Plan Comments:         Anesthesia Quick Evaluation

## 2015-11-28 NOTE — Op Note (Signed)
[facility] Patient Name: Julia Robertson Procedure Date: 11/28/2015 8:01 AM MRN: [id number] Account #: [id number] Date of Birth: [birth year] Admit Type: Outpatient Age: 60 Room: [facility] ENDO ROOM 4 Gender: Female Note Status: Finalized Procedure:         Colonoscopy Indications:       High risk colon cancer surveillance: Personal history of                     colonic polyps Providers:         Lucilla Lame, MD Referring MD:      Jerrell Belfast, MD (Referring MD) Medicines:         Propofol per Anesthesia Complications:     No immediate complications. Procedure:         Pre-Anesthesia Assessment:                    - Prior to the procedure, a History and Physical was                     performed, and patient medications and allergies were                     reviewed. The patient's tolerance of previous anesthesia                     was also reviewed. The risks and benefits of the procedure                     and the sedation options and risks were discussed with the                     patient. All questions were answered, and informed consent                     was obtained. Prior Anticoagulants: The patient has taken                     no previous anticoagulant or antiplatelet agents. ASA                     Grade Assessment: II - A patient with mild systemic                     disease. After reviewing the risks and benefits, the                     patient was deemed in satisfactory condition to undergo                     the procedure.                    After obtaining informed consent, the colonoscope was                     passed under direct vision. Throughout the procedure, the                     patient's blood pressure, pulse, and oxygen saturations                     were monitored continuously. The Colonoscope was  introduced through the anus and advanced to the the cecum,                     identified by  appendiceal orifice and ileocecal valve. The                     colonoscopy was performed without difficulty. The patient                     tolerated the procedure well. The quality of the bowel                     preparation was excellent. Findings:      The perianal and digital rectal examinations were normal.      A 6 mm polyp was found in the ascending colon. The polyp was sessile.       The polyp was removed with a cold snare. Resection and retrieval were       complete.      A 3 mm polyp was found in the descending colon. The polyp was sessile.       The polyp was removed with a cold biopsy forceps. Resection and       retrieval were complete.      Non-bleeding internal hemorrhoids were found during retroflexion. The       hemorrhoids were Grade II (internal hemorrhoids that prolapse but reduce       spontaneously). Impression:        - One 6 mm polyp in the ascending colon. Resected and                     retrieved.                    - One 3 mm polyp in the descending colon. Resected and                     retrieved.                    - Non-bleeding internal hemorrhoids. Recommendation:    - Await pathology results.                    - Repeat colonoscopy in 5 years for surveillance. Procedure Code(s): --- Professional ---                    [phone], Colonoscopy, flexible; with removal of tumor(s),                     polyp(s), or other lesion(s) by snare technique                    45380, 73, Colonoscopy, flexible; with biopsy, single or                     multiple Diagnosis Code(s): --- Professional ---                    Z86.010, Personal history of colonic polyps                    D12.2, Benign neoplasm of ascending colon                    D12.4, Benign neoplasm of descending colon CPT copyright 2014 American Medical Association. All rights  reserved. The codes documented in this report are preliminary and upon coder review may  be revised to meet current  compliance requirements. Lucilla Lame, MD 11/28/2015 8:20:30 AM This report has been signed electronically. Number of Addenda: 0 Note Initiated On: 11/28/2015 8:01 AM Scope Withdrawal Time: 0 hours 8 minutes 5 seconds  Total Procedure Duration: 0 hours 10 minutes 58 seconds       [facility]

## 2015-11-28 NOTE — Anesthesia Postprocedure Evaluation (Signed)
Anesthesia Post Note  Patient: Julia Robertson  Procedure(s) Performed: Procedure(s) (LRB): COLONOSCOPY WITH PROPOFOL (N/A)  Patient location during evaluation: Endoscopy Anesthesia Type: General Level of consciousness: awake and alert Pain management: pain level controlled Vital Signs Assessment: post-procedure vital signs reviewed and stable Respiratory status: spontaneous breathing, nonlabored ventilation, respiratory function stable and patient connected to nasal cannula oxygen Cardiovascular status: blood pressure returned to baseline and stable Postop Assessment: no signs of nausea or vomiting Anesthetic complications: no    Last Vitals:  Filed Vitals:   11/28/15 0840 11/28/15 0850  BP: 126/79 133/75  Pulse: 82 80  Temp:    Resp: 15 16    Last Pain: There were no vitals filed for this visit.               Precious Haws Kathleen Likins

## 2015-11-28 NOTE — Transfer of Care (Signed)
Immediate Anesthesia Transfer of Care Note  Patient: Julia Robertson  Procedure(s) Performed: Procedure(s): COLONOSCOPY WITH PROPOFOL (N/A)  Patient Location: PACU  Anesthesia Type:General  Level of Consciousness: awake and alert   Airway & Oxygen Therapy: Patient Spontanous Breathing and Patient connected to face mask oxygen  Post-op Assessment: Report given to RN and Post -op Vital signs reviewed and stable  Post vital signs: Reviewed and stable  Last Vitals:  Filed Vitals:   11/28/15 0708 11/28/15 0820  BP: 134/86 113/64  Pulse: 86 89  Temp: 35.9 C   Resp: 18 12    Complications: No apparent anesthesia complications

## 2015-11-28 NOTE — H&P (Signed)
  [facility]  [address] [place], [place] [address] Phone: [phone] Fax : [phone]  Primary Care Physician:  Margarita Rana, MD Primary Gastroenterologist:  Dr. Allen Norris  Pre-Procedure History & Physical: HPI:  Julia Robertson is a 60 y.o. female is here for an colonoscopy.   Past Medical History  Diagnosis Date  . Hypertension   . Diabetes mellitus without complication [facility])     Past Surgical History  Procedure Laterality Date  . 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 Rudene Christians at [facility]  . Cholecystectomy      Prior to Admission medications   Medication Sig Start Date End Date Taking? Authorizing Provider  Multiple Vitamin (MULTIVITAMIN) capsule Take 1 capsule by mouth daily.   Yes Historical Provider, MD  BIOTIN PO Take by mouth.    Historical Provider, MD  metFORMIN (GLUCOPHAGE) 500 MG tablet Take 1 tablet (500 mg total) by mouth 2 (two) times daily with a meal. 11/13/15   Margarita Rana, MD  traMADol (ULTRAM) 50 MG tablet Take 1 tablet (50 mg total) by mouth every 6 (six) hours as needed. 10/10/15   Margarita Rana, MD  traZODone (DESYREL) 100 MG tablet Take 1 tablet (100 mg total) by mouth at bedtime. 10/10/15   Margarita Rana, MD  triamterene-hydrochlorothiazide (MAXZIDE-25) 37.5-25 MG tablet Take 1 tablet by mouth daily. 10/10/15   Margarita Rana, MD    Allergies as of 10/25/2015  . (No Known Allergies)    Family History  Problem Relation Age of Onset  . Breast cancer Neg Hx     Social History   Social History  . Marital Status: Married    Spouse Name: N/A  . Number of Children: N/A  . Years of Education: N/A   Occupational History  . 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 Smoking status: Never Smoker   . Smokeless tobacco: Never Used  . Alcohol Use: No  . Drug Use: No  . Sexual Activity: Not on file   Other Topics Concern  . Not on file   Social History Narrative     Review of Systems: See HPI, otherwise negative ROS  Physical Exam: BP 134/86 mmHg  Pulse 86  Temp(Src) 96.7 F (35.9 C) (Tympanic)  Resp 18  Ht 5\' 4"  (1.626 m)  Wt 280 lb (127.007 kg)  BMI 48.04 kg/m2  SpO2 98% General:   Alert,  pleasant and cooperative in NAD Head:  Normocephalic and atraumatic. Neck:  Supple; no masses or thyromegaly. Lungs:  Clear throughout to auscultation.    Heart:  Regular rate and rhythm. Abdomen:  Soft, nontender and nondistended. Normal bowel sounds, without guarding, and without rebound.   Neurologic:  Alert and  oriented x4;  grossly normal neurologically.  Impression/Plan: [name] is here for an colonoscopy to be performed for history of polyps  Risks, benefits, limitations, and alternatives regarding  colonoscopy have been reviewed with the patient.  Questions have been answered.  All parties agreeable.   Ollen Bowl, MD  11/28/2015, 7:57 AM

## 2015-11-29 ENCOUNTER — Encounter: Payer: Self-pay | Admitting: Gastroenterology

## 2015-11-29 LAB — SURGICAL PATHOLOGY

## 2015-11-30 ENCOUNTER — Encounter: Payer: Self-pay | Admitting: Gastroenterology

## 2015-12-26 ENCOUNTER — Encounter: Payer: Self-pay | Admitting: Family Medicine

## 2015-12-26 ENCOUNTER — Ambulatory Visit (INDEPENDENT_AMBULATORY_CARE_PROVIDER_SITE_OTHER): Payer: BLUE CROSS/BLUE SHIELD | Admitting: Family Medicine

## 2015-12-26 VITALS — BP 122/72 | HR 82 | Temp 97.2°F | Resp 16 | Ht 64.0 in | Wt 262.0 lb

## 2015-12-26 DIAGNOSIS — Z0000 Encounter for general adult medical examination without abnormal findings: Secondary | ICD-10-CM

## 2015-12-26 DIAGNOSIS — R748 Abnormal levels of other serum enzymes: Secondary | ICD-10-CM

## 2015-12-26 LAB — POCT URINALYSIS DIPSTICK
Bilirubin, UA: NEGATIVE
Blood, UA: NEGATIVE
Glucose, UA: NEGATIVE
Ketones, UA: NEGATIVE
Leukocytes, UA: NEGATIVE
Nitrite, UA: NEGATIVE
Protein, UA: NEGATIVE
Spec Grav, UA: 1.015
Urobilinogen, UA: 0.2
pH, UA: 6.5

## 2015-12-26 NOTE — Progress Notes (Signed)
Patient ID: Julia Robertson, female   DOB: 10-Oct-1956, 61 y.o.   MRN: [id number]       Patient: Julia Robertson, Female    DOB: 1955-11-26, 60 y.o.   MRN: [id number] Visit Date: 12/26/2015  Today's Provider: Margarita Rana, MD   Chief Complaint  Patient presents with  . Annual Exam   Subjective:    Annual physical exam [name] is a 60 y.o. female who presents today for health maintenance and complete physical. She feels well. She reports exercising 5 days a week. She reports she is sleeping well. 03/29/13 CPE 03/29/13 Pap-neg; HPV-neg 11/10/15 Mammo-BI-RADS 1 11/28/15 Colon-polyp, int hemorrhoids recheck 5 yrs 10/23/11 EKG  Lab Results  Component Value Date   WBC 4.9 11/10/2015   HGB 11.0* 09/24/2012   HCT 39.2 11/10/2015   PLT 256 11/10/2015   GLUCOSE 140* 11/10/2015   CHOL 205* 11/10/2015   TRIG 139 11/10/2015   HDL 43 11/10/2015   LDLCALC 134* 11/10/2015   ALT 52* 11/10/2015   AST 43* 11/10/2015   NA 137 11/10/2015   K 3.4* 11/10/2015   CL 94* 11/10/2015   CREATININE 0.79 11/10/2015   BUN 9 11/10/2015   CO2 27 11/10/2015   TSH 1.680 11/10/2015   INR 1.0 09/10/2012   HGBA1C 7.6* 11/10/2015    -----------------------------------------------------------------   Review of Systems  Constitutional: Negative.   HENT: Negative.   Eyes: Negative.   Respiratory: Negative.   Cardiovascular: Negative.   Gastrointestinal: Negative.   Endocrine: Negative.   Genitourinary: Negative.   Musculoskeletal: Negative.   Skin: Negative.   Allergic/Immunologic: Negative.   Neurological: Negative.   Hematological: Negative.   Psychiatric/Behavioral: Negative.     Social History      She  reports that she has never smoked. She has never used smokeless tobacco. She reports that she does not drink alcohol or use illicit drugs.       Social History   Social History  . Marital Status: Married    Spouse Name: N/A  . Number of Children: N/A  . Years of  Education: N/A   Occupational History  . 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 Smoking status: Never Smoker   . Smokeless tobacco: Never Used  . Alcohol Use: No  . Drug Use: No  . Sexual Activity: Not Asked   Other Topics Concern  . None   Social History Narrative    Past Medical History  Diagnosis Date  . Hypertension   . Diabetes mellitus without complication [facility])      Patient Active Problem List   Diagnosis Date Noted  . Benign neoplasm of descending colon   . Personal history of colonic polyps   . Diabetes mellitus type 2, uncomplicated ([place]) 0000000  . Elevated liver enzymes 11/13/2015  . Hypokalemia 11/13/2015  . Avitaminosis D 10/10/2015  . Encounter for screening colonoscopy 10/10/2015  . Insomnia 10/10/2015  . Knee pain 10/10/2015  . Encounter for screening mammogram for breast cancer 10/10/2015  . Elevated blood sugar 09/22/2015  . Hypercholesterolemia 03/08/2010  . Morbid obesity ([place]) 02/02/2010  . Cannot sleep 12/25/2005  . Absolute anemia 12/03/2005  . BP (high blood pressure) 02/04/2000    Past Surgical History  Procedure Laterality Date  . 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 Rudene Christians at [facility]  . Cholecystectomy    . Colonoscopy with propofol N/A 11/28/2015    Procedure:  COLONOSCOPY WITH PROPOFOL;  Surgeon: Lucilla Lame, MD;  Location: [facility] ENDOSCOPY;  Service: Endoscopy;  Laterality: N/A;    Family History        Family Status  Relation Status Death Age  . Mother Deceased   . Father Deceased   . Sister Alive   . Brother Deceased   . Brother Deceased   . Brother Alive   . Brother Alive   . Brother Alive         Her family history includes Cancer in her father and mother; Diabetes in her brother; Hypertension in her brother, brother, brother, brother, and sister; Kidney disease in her brother and brother. There is no history of Breast cancer.    No Known Allergies  Previous  Medications   BIOTIN PO    Take by mouth.   METFORMIN (GLUCOPHAGE) 500 MG TABLET    Take 1 tablet (500 mg total) by mouth 2 (two) times daily with a meal.   MULTIPLE VITAMIN (MULTIVITAMIN) CAPSULE    Take 1 capsule by mouth daily.   TRAMADOL (ULTRAM) 50 MG TABLET    Take 1 tablet (50 mg total) by mouth every 6 (six) hours as needed.   TRAZODONE (DESYREL) 100 MG TABLET    Take 1 tablet (100 mg total) by mouth at bedtime.   TRIAMTERENE-HYDROCHLOROTHIAZIDE (MAXZIDE-25) 37.5-25 MG TABLET    Take 1 tablet by mouth daily.    Patient Care Team: Margarita Rana, MD as PCP - General (Family Medicine)     Objective:   Vitals: BP 122/72 mmHg  Pulse 82  Temp(Src) 97.2 F (36.2 C) (Oral)  Resp 16  Ht 5\' 4"  (1.626 m)  Wt 262 lb (118.842 kg)  BMI 44.95 kg/m2   Physical Exam  Constitutional: She is oriented to person, place, and time. She appears well-developed and well-nourished.  HENT:  Head: Normocephalic and atraumatic.  Right Ear: Tympanic membrane, external ear and ear canal normal.  Left Ear: Tympanic membrane, external ear and ear canal normal.  Nose: Nose normal.  Mouth/Throat: Uvula is midline, oropharynx is clear and moist and mucous membranes are normal.  Eyes: Conjunctivae, EOM and lids are normal. Pupils are equal, round, and reactive to light.  Neck: Trachea normal and normal range of motion. Neck supple. Carotid bruit is not present. No thyroid mass and no thyromegaly present.  Cardiovascular: Normal rate, regular rhythm and normal heart sounds.   Pulmonary/Chest: Effort normal and breath sounds normal.  Abdominal: Soft. Normal appearance and bowel sounds are normal. There is no hepatosplenomegaly. There is no tenderness.  Musculoskeletal: Normal range of motion.  Lymphadenopathy:    She has no cervical adenopathy.    She has no axillary adenopathy.  Neurological: She is alert and oriented to person, place, and time. She has normal strength. No cranial nerve deficit.  Skin:  Skin is warm, dry and intact.  Psychiatric: She has a normal mood and affect. Her speech is normal and behavior is normal. Judgment and thought content normal. Cognition and memory are normal.     Assessment & Plan:     Routine Health Maintenance and Physical Exam  Exercise Activities and Dietary recommendations Goals    None      Immunization History  Administered Date(s) Administered  . DTaP 11/25/2008  . Pneumococcal Polysaccharide-23 11/26/1999  . Td 11/25/1993       1. Annual physical exam Stable. Patient advised to continue eating healthy and exercise daily. - POCT urinalysis dipstick  2. Elevated liver enzymes  F/U pending lab report. - Comprehensive metabolic panel     Patient seen and examined by Dr. Jerrell Belfast, and note scribed by Philbert Riser. Dimas, CMA.   I have reviewed the document for accuracy and completeness and I agree with above. Jerrell Belfast, MD   Margarita Rana, MD      --------------------------------------------------------------------

## 2016-04-22 ENCOUNTER — Other Ambulatory Visit: Payer: Self-pay | Admitting: Family Medicine

## 2016-04-22 DIAGNOSIS — E119 Type 2 diabetes mellitus without complications: Secondary | ICD-10-CM

## 2016-04-23 ENCOUNTER — Ambulatory Visit: Payer: Self-pay | Admitting: Family Medicine

## 2016-05-09 ENCOUNTER — Telehealth: Payer: Self-pay

## 2016-05-09 NOTE — Telephone Encounter (Signed)
Called pt to inquire about [employer] we received. Mail box was full. Renaldo Fiddler, CMA

## 2016-05-17 ENCOUNTER — Ambulatory Visit: Payer: Self-pay | Admitting: Family Medicine

## 2016-05-23 ENCOUNTER — Ambulatory Visit (INDEPENDENT_AMBULATORY_CARE_PROVIDER_SITE_OTHER): Payer: BLUE CROSS/BLUE SHIELD | Admitting: Family Medicine

## 2016-05-23 ENCOUNTER — Encounter: Payer: Self-pay | Admitting: Family Medicine

## 2016-05-23 VITALS — BP 118/74 | HR 64 | Temp 98.5°F | Resp 16 | Wt 252.0 lb

## 2016-05-23 DIAGNOSIS — G4700 Insomnia, unspecified: Secondary | ICD-10-CM | POA: Diagnosis not present

## 2016-05-23 DIAGNOSIS — I10 Essential (primary) hypertension: Secondary | ICD-10-CM | POA: Diagnosis not present

## 2016-05-23 DIAGNOSIS — R202 Paresthesia of skin: Secondary | ICD-10-CM

## 2016-05-23 DIAGNOSIS — M25561 Pain in right knee: Secondary | ICD-10-CM

## 2016-05-23 DIAGNOSIS — E7800 Pure hypercholesterolemia, unspecified: Secondary | ICD-10-CM | POA: Diagnosis not present

## 2016-05-23 DIAGNOSIS — E119 Type 2 diabetes mellitus without complications: Secondary | ICD-10-CM | POA: Diagnosis not present

## 2016-05-23 LAB — POCT GLYCOSYLATED HEMOGLOBIN (HGB A1C): Hemoglobin A1C: 6.7

## 2016-05-23 MED ORDER — TRAMADOL HCL 50 MG PO TABS: 50.0000 mg | ORAL_TABLET | Freq: Four times a day (QID) | ORAL | Status: DC | PRN

## 2016-05-23 MED ORDER — TRAZODONE HCL 100 MG PO TABS: 100.0000 mg | ORAL_TABLET | Freq: Every day | ORAL | Status: DC

## 2016-05-23 MED ORDER — METFORMIN HCL 1000 MG PO TABS: 1000.0000 mg | ORAL_TABLET | Freq: Two times a day (BID) | ORAL | Status: DC

## 2016-05-23 MED ORDER — TRIAMTERENE-HCTZ 37.5-25 MG PO TABS: 1.0000 | ORAL_TABLET | Freq: Every day | ORAL | Status: DC

## 2016-05-23 NOTE — Progress Notes (Signed)
Patient: Julia Robertson Female    DOB: 23-Jul-1956   60 y.o.   MRN: [id number] Visit Date: 05/23/2016  Today's Provider: Margarita Rana, MD   Chief Complaint  Patient presents with  . Hypertension  . Hyperlipidemia  . Diabetes   Subjective:    HPI  Diabetes Mellitus Type II, Follow-up:   Lab Results  Component Value Date   HGBA1C 6.7 05/23/2016   HGBA1C 7.6* 11/10/2015   Last seen for diabetes 6 months ago.  Management since then includes started Metformin 500mg  twice a day. She reports excellent compliance with treatment. She is not having side effects.  Current symptoms include paresthesia of the feet and have been unchanged. Home blood sugar records: Not being checked.   Episodes of hypoglycemia? yes    Most Recent Eye Exam: Over a year. Weight trend: decreasing steadily Prior visit with dietician: no Current diet: in general, a "healthy" diet   Current exercise: walking  ------------------------------------------------------------------------   Hypertension, follow-up:  BP Readings from Last 3 Encounters:  05/23/16 118/74  12/26/15 122/72  11/28/15 133/75    She was last seen for hypertension 6 months ago.  Management since that visit includes None .She reports excellent compliance with treatment. She is not having side effects.  She is exercising. She is adherent to low salt diet.   She is experiencing none.  Patient denies chest pain, irregular heart beat, lower extremity edema and palpitations.   Cardiovascular risk factors include diabetes mellitus, dyslipidemia, hypertension and obesity (BMI >= 30 kg/m2).   ------------------------------------------------------------------------    Lipid/Cholesterol, Follow-up:   Last seen for this 6 months ago.  Management since that visit includes None.  Last Lipid Panel:    Component Value Date/Time   CHOL 205* 11/10/2015 1124   TRIG 139 11/10/2015 1124   HDL 43 11/10/2015 1124   CHOLHDL  4.8* 11/10/2015 1124   LDLCALC 134* 11/10/2015 1124    She is not having side effects.   Wt Readings from Last 3 Encounters:  05/23/16 252 lb (114.306 kg)  12/26/15 262 lb (118.842 kg)  11/28/15 280 lb (127.007 kg)   ------------------------------------------------------------------------    No Known Allergies No outpatient prescriptions have been marked as taking for the 05/23/16 encounter (Office Visit) with Margarita Rana, MD.    Review of Systems  Constitutional: Negative.   Respiratory: Negative.   Cardiovascular: Negative.   Gastrointestinal: Negative.   Musculoskeletal: Positive for arthralgias (Bilateral shoulder pain and right hip pain.). Negative for myalgias, back pain, joint swelling, gait problem, neck pain and neck stiffness.  Neurological: Negative for dizziness, light-headedness and headaches.    Social History  Substance Use Topics  . Smoking status: Never Smoker   . Smokeless tobacco: Never Used  . Alcohol Use: No   Objective:   BP 118/74 mmHg  Pulse 64  Temp(Src) 98.5 F (36.9 C) (Oral)  Resp 16  Wt 252 lb (114.306 kg)  Physical Exam  Constitutional: She is oriented to person, place, and time. She appears well-developed and well-nourished.  Neurological: She is alert and oriented to person, place, and time.  Skin: Skin is warm and dry.  Psychiatric: She has a normal mood and affect. Her behavior is normal. Judgment and thought content normal.   Results for orders placed or performed in visit on 05/23/16  POCT glycosylated hemoglobin (Hb A1C)  Result Value Ref Range   Hemoglobin A1C 6.7       Assessment & Plan:  1. Essential hypertension Stable, continue current medications.  Refills provided.   - triamterene-hydrochlorothiazide (MAXZIDE-25) 37.5-25 MG tablet; Take 1 tablet by mouth daily.  Dispense: 30 tablet; Refill: 5  2. Type 2 diabetes mellitus without complication, without long-term current use of insulin (HCC) A1C improved at  6.7%; but not completely to goal yet.  Will refer to [facility] and also increase Metformin to 1,000 mg twice a day. Will recheck labs, secondary to elevated liver enzymes and paresthesia in her feet.  Recheck in [place] in four months.   - POCT glycosylated hemoglobin (Hb A1C) - Referral to Nutrition and Diabetes Services - Comprehensive metabolic panel - metFORMIN (GLUCOPHAGE) 1000 MG tablet; Take 1 tablet (1,000 mg total) by mouth 2 (two) times daily with a meal.  Dispense: 60 tablet; Refill: 5  3. Hypercholesterolemia Stable; pt will work on lifestyle changes before starting a Statin.  She has already lost some weight.   4. Paresthesia Will check labs as below.   - Vitamin B12 - TSH  5. Right knee pain Knee pain stable; but now with bilateral shoulder pain and right hip pain.  Pt is not ready for orthopedics yet.  She states her insurance does not require a referral so she will call her orthopedic when ready.  Refills on Tramadol provided.   - traMADol (ULTRAM) 50 MG tablet; Take 1 tablet (50 mg total) by mouth every 6 (six) hours as needed.  Dispense: 60 tablet; Refill: 5  6. Insomnia Stable; continue current medication.  Refills provided.   - traZODone (DESYREL) 100 MG tablet; Take 1 tablet (100 mg total) by mouth at bedtime.  Dispense: 30 tablet; Refill: 5  Patient was seen and examined by Jerrell Belfast, MD, and note scribed by Ashley Royalty, CMA.   I have reviewed the document for accuracy and completeness and I agree with above. - Jerrell Belfast, MD     Margarita Rana, MD  [facility]

## 2016-05-27 ENCOUNTER — Other Ambulatory Visit: Payer: Self-pay | Admitting: Physician Assistant

## 2016-09-19 ENCOUNTER — Ambulatory Visit: Payer: BLUE CROSS/BLUE SHIELD | Admitting: Physician Assistant

## 2017-01-14 ENCOUNTER — Ambulatory Visit
Admission: RE | Admit: 2017-01-14 | Discharge: 2017-01-14 | Disposition: A | Discharge status: Home / Self Care | Payer: BLUE CROSS/BLUE SHIELD | Source: Ambulatory Visit | Attending: Physician Assistant | Admitting: Physician Assistant

## 2017-01-14 ENCOUNTER — Ambulatory Visit (INDEPENDENT_AMBULATORY_CARE_PROVIDER_SITE_OTHER): Payer: BLUE CROSS/BLUE SHIELD | Admitting: Physician Assistant

## 2017-01-14 ENCOUNTER — Encounter: Payer: Self-pay | Admitting: Physician Assistant

## 2017-01-14 VITALS — BP 128/74 | HR 96 | Temp 101.1°F | Resp 20 | Ht 64.5 in | Wt 254.0 lb

## 2017-01-14 DIAGNOSIS — R509 Fever, unspecified: Secondary | ICD-10-CM | POA: Insufficient documentation

## 2017-01-14 DIAGNOSIS — R6889 Other general symptoms and signs: Secondary | ICD-10-CM | POA: Insufficient documentation

## 2017-01-14 DIAGNOSIS — R05 Cough: Secondary | ICD-10-CM | POA: Diagnosis not present

## 2017-01-14 DIAGNOSIS — R059 Cough, unspecified: Secondary | ICD-10-CM

## 2017-01-14 LAB — POCT INFLUENZA A/B
Influenza A, POC: NEGATIVE
Influenza B, POC: NEGATIVE

## 2017-01-14 MED ORDER — OSELTAMIVIR PHOSPHATE 75 MG PO CAPS: 75.0000 mg | ORAL_CAPSULE | Freq: Two times a day (BID) | ORAL | 0 refills | Status: AC

## 2017-01-14 MED ORDER — BENZONATATE 100 MG PO CAPS: 100.0000 mg | ORAL_CAPSULE | Freq: Three times a day (TID) | ORAL | Status: DC | PRN

## 2017-01-14 NOTE — Patient Instructions (Signed)

## 2017-01-14 NOTE — Progress Notes (Signed)
Patient: Julia Robertson Female    DOB: 05/16/1956   62 y.o.   MRN: [id number] Visit Date: 01/14/2017  Today's Provider: Trinna Post, PA-C   Chief Complaint  Patient presents with  . Cough   Subjective:    Cough  This is a new problem. The current episode started in the past 7 days. The problem has been gradually worsening. The cough is productive of sputum. Associated symptoms include chills, a fever, headaches, postnasal drip and a sore throat. She has tried OTC cough suppressant and rest for the symptoms. The treatment provided no relief. There is no history of asthma or environmental allergies.   Patient is a 61 y/o woman with history of Type II DM and HTN who presents with cough and above symptoms. She also works in a nursing home.     No Known Allergies   Current Outpatient Prescriptions:  .  BIOTIN PO, Take by mouth., Disp: , Rfl:  .  metFORMIN (GLUCOPHAGE) 1000 MG tablet, Take 1 tablet (1,000 mg total) by mouth 2 (two) times daily with a meal., Disp: 60 tablet, Rfl: 5 .  Multiple Vitamin (MULTIVITAMIN) capsule, Take 1 capsule by mouth daily., Disp: , Rfl:  .  traMADol (ULTRAM) 50 MG tablet, Take 1 tablet (50 mg total) by mouth every 6 (six) hours as needed., Disp: 60 tablet, Rfl: 5 .  traZODone (DESYREL) 100 MG tablet, Take 1 tablet (100 mg total) by mouth at bedtime., Disp: 30 tablet, Rfl: 5 .  triamterene-hydrochlorothiazide (MAXZIDE-25) 37.5-25 MG tablet, Take 1 tablet by mouth daily., Disp: 30 tablet, Rfl: 5 .  benzonatate (TESSALON PERLES) 100 MG capsule, Take 1 capsule (100 mg total) by mouth 3 (three) times daily as needed for cough., Disp: 21 capsule, Rfl: 00  Review of Systems  Constitutional: Positive for chills and fever.  HENT: Positive for postnasal drip and sore throat.   Respiratory: Positive for cough.   Allergic/Immunologic: Negative for environmental allergies.  Neurological: Positive for headaches.    Social History  Substance Use  Topics  . Smoking status: Never Smoker  . Smokeless tobacco: Never Used  . Alcohol use No   Objective:   BP 128/74 (BP Location: Left Arm, Patient Position: Sitting, Cuff Size: Large)   Pulse 96   Temp (!) 101.1 F (38.4 C)   Resp 20   Ht 5' 4.5" (1.638 m)   Wt 254 lb (115.2 kg)   SpO2 96%   BMI 42.93 kg/m   Physical Exam  Constitutional: She appears well-developed and well-nourished. She appears ill.  HENT:  Right Ear: Tympanic membrane normal.  Left Ear: Tympanic membrane normal.  Mouth/Throat: Posterior oropharyngeal edema and posterior oropharyngeal erythema present. No oropharyngeal exudate.  Neck: Neck supple.  Cardiovascular: Normal rate and regular rhythm.   Pulmonary/Chest: Effort normal and breath sounds normal. No respiratory distress. She has no wheezes. She has no rales.  Lymphadenopathy:    She has no cervical adenopathy.  Neurological: She is alert.  Skin: Skin is warm and dry.  Psychiatric: She has a normal mood and affect.  Vitals reviewed.       Assessment & Plan:     1. Flu-like symptoms  Rapid flu in office negative. Patient febrile in office today, so will get CXR 2/2 length of symptoms and productive cough. CXR was clear, will send in Tamiflu.  - DG Chest 2 View; Future - POCT Influenza A/B  2. Cough  - benzonatate (TESSALON PERLES) 100  MG capsule; Take 1 capsule (100 mg total) by mouth 3 (three) times daily as needed for cough.  Dispense: 21 capsule; Refill: 00   Patient Instructions  Influenza, Adult Influenza, more commonly known as "the flu," is a viral infection that primarily affects the respiratory tract. The respiratory tract includes organs that help you breathe, such as the lungs, nose, and throat. The flu causes many common cold symptoms, as well as a high fever and body aches. The flu spreads easily from person to person (is contagious). Getting a flu shot (influenza vaccination) every year is the best way to prevent  influenza. What are the causes? Influenza is caused by a virus. You can catch the virus by:  Breathing in droplets from an infected person's cough or sneeze.  Touching something that was recently contaminated with the virus and then touching your mouth, nose, or eyes. What increases the risk? The following factors may make you more likely to get the flu:  Not cleaning your hands frequently with soap and water or alcohol-based hand sanitizer.  Having close contact with many people during cold and flu season.  Touching your mouth, eyes, or nose without washing or sanitizing your hands first.  Not drinking enough fluids or not eating a healthy diet.  Not getting enough sleep or exercise.  Being under a high amount of stress.  Not getting a yearly (annual) flu shot. You may be at a higher risk of complications from the flu, such as a severe lung infection (pneumonia), if you:  Are over the age of 64.  Are pregnant.  Have a weakened disease-fighting system (immune system). You may have a weakened immune system if you:  Have HIV or AIDS.  Are undergoing chemotherapy.  Aretaking medicines that reduce the activity of (suppress) the immune system.  Have a long-term (chronic) illness, such as heart disease, kidney disease, diabetes, or lung disease.  Have a liver disorder.  Are obese.  Have anemia. What are the signs or symptoms? Symptoms of this condition typically last 4-10 days and may include:  Fever.  Chills.  Headache, body aches, or muscle aches.  Sore throat.  Cough.  Runny or congested nose.  Chest discomfort and cough.  Poor appetite.  Weakness or tiredness (fatigue).  Dizziness.  Nausea or vomiting. How is this diagnosed? This condition may be diagnosed based on your medical history and a physical exam. Your health care provider may do a nose or throat swab test to confirm the diagnosis. How is this treated? If influenza is detected early, you  can be treated with antiviral medicine that can reduce the length of your illness and the severity of your symptoms. This medicine may be given by mouth (orally) or through an IV tube that is inserted in one of your veins. The goal of treatment is to relieve symptoms by taking care of yourself at home. This may include taking over-the-counter medicines, drinking plenty of fluids, and adding humidity to the air in your home. In some cases, influenza goes away on its own. Severe influenza or complications from influenza may be treated in a hospital. Follow these instructions at home:  Take over-the-counter and prescription medicines only as told by your health care provider.  Use a cool mist humidifier to add humidity to the air in your home. This can make breathing easier.  Rest as needed.  Drink enough fluid to keep your urine clear or pale yellow.  Cover your mouth and nose when you cough or  sneeze.  Wash your hands with soap and water often, especially after you cough or sneeze. If soap and water are not available, use hand sanitizer.  Stay home from work or school as told by your health care provider. Unless you are visiting your health care provider, try to avoid leaving home until your fever has been gone for 24 hours without the use of medicine.  Keep all follow-up visits as told by your health care provider. This is important. How is this prevented?  Getting an annual flu shot is the best way to avoid getting the flu. You may get the flu shot in late summer, fall, or winter. Ask your health care provider when you should get your flu shot.  Wash your hands often or use hand sanitizer often.  Avoid contact with people who are sick during cold and flu season.  Eat a healthy diet, drink plenty of fluids, get enough sleep, and exercise regularly. Contact a health care provider if:  You develop new symptoms.  You have:  Chest pain.  Diarrhea.  A fever.  Your cough gets  worse.  You produce more mucus.  You feel nauseous or you vomit. Get help right away if:  You develop shortness of breath or difficulty breathing.  Your skin or nails turn a bluish color.  You have severe pain or stiffness in your neck.  You develop a sudden headache or sudden pain in your face or ear.  You cannot stop vomiting. This information is not intended to replace advice given to you by your health care provider. Make sure you discuss any questions you have with your health care provider. Document Released: 11/08/2000 Document Revised: 04/18/2016 Document Reviewed: 09/05/2015 Elsevier Interactive Patient Education  2017 [employer].   Return if symptoms worsen or fail to improve.  The entirety of the information documented in the History of Present Illness, Review of Systems and Physical Exam were personally obtained by me. Portions of this information were initially documented by Ernst Bowler, CMA and reviewed by me for thoroughness and accuracy.         Trinna Post, PA-C  [facility]

## 2017-01-15 ENCOUNTER — Telehealth: Payer: Self-pay

## 2017-01-15 NOTE — Telephone Encounter (Signed)
Patient has not been seen since 04/2016 with Dr. Venia Minks. Has multiple chronic medical conditions. From chart review, has been out of her medications around 3-4 months and needs updating on A1C and labwork. Will not refill this as I have not assessed her for these issues, she needs another appointment to assess this.

## 2017-01-15 NOTE — Telephone Encounter (Signed)
Pt advised.  Apt made for 01/21/2017 at 3:30   Thanks,   -Mickel Baas

## 2017-01-15 NOTE — Telephone Encounter (Signed)
Please review. Thanks!  

## 2017-01-15 NOTE — Telephone Encounter (Signed)
-----   Message from Trinna Post, [place] sent at 01/14/2017  5:01 PM EST ----- CXR normal. Sent in Tamiflu. Talked with patient about checking pharmacy.

## 2017-01-15 NOTE — Telephone Encounter (Signed)
Patient advised and while on the phone states she was supposed to have all her medicine refilled too.   Thanks ED

## 2017-01-21 ENCOUNTER — Encounter: Payer: Self-pay | Admitting: Physician Assistant

## 2017-01-21 ENCOUNTER — Ambulatory Visit (INDEPENDENT_AMBULATORY_CARE_PROVIDER_SITE_OTHER): Payer: BLUE CROSS/BLUE SHIELD | Admitting: Physician Assistant

## 2017-01-21 VITALS — BP 126/78 | HR 72 | Temp 98.1°F | Resp 16 | Wt 256.0 lb

## 2017-01-21 DIAGNOSIS — E119 Type 2 diabetes mellitus without complications: Secondary | ICD-10-CM

## 2017-01-21 DIAGNOSIS — I10 Essential (primary) hypertension: Secondary | ICD-10-CM | POA: Diagnosis not present

## 2017-01-21 DIAGNOSIS — R059 Cough, unspecified: Secondary | ICD-10-CM

## 2017-01-21 DIAGNOSIS — G8929 Other chronic pain: Secondary | ICD-10-CM | POA: Diagnosis not present

## 2017-01-21 DIAGNOSIS — R05 Cough: Secondary | ICD-10-CM | POA: Diagnosis not present

## 2017-01-21 DIAGNOSIS — E7800 Pure hypercholesterolemia, unspecified: Secondary | ICD-10-CM

## 2017-01-21 DIAGNOSIS — G4700 Insomnia, unspecified: Secondary | ICD-10-CM

## 2017-01-21 DIAGNOSIS — M25561 Pain in right knee: Secondary | ICD-10-CM

## 2017-01-21 LAB — POCT UA - MICROALBUMIN: Microalbumin Ur, POC: 50 mg/L

## 2017-01-21 LAB — POCT GLYCOSYLATED HEMOGLOBIN (HGB A1C): Hemoglobin A1C: 6.9

## 2017-01-21 MED ORDER — ATORVASTATIN CALCIUM 10 MG PO TABS: 10.0000 mg | ORAL_TABLET | Freq: Every day | ORAL | 0 refills | Status: DC

## 2017-01-21 MED ORDER — TRAMADOL HCL 50 MG PO TABS: 50.0000 mg | ORAL_TABLET | Freq: Four times a day (QID) | ORAL | 5 refills | Status: DC | PRN

## 2017-01-21 MED ORDER — TRIAMTERENE-HCTZ 37.5-25 MG PO TABS: 1.0000 | ORAL_TABLET | Freq: Every day | ORAL | 5 refills | Status: DC

## 2017-01-21 MED ORDER — GLIPIZIDE ER 5 MG PO TB24: 5.0000 mg | ORAL_TABLET | Freq: Every day | ORAL | 0 refills | Status: DC

## 2017-01-21 MED ORDER — METFORMIN HCL 1000 MG PO TABS: 1000.0000 mg | ORAL_TABLET | Freq: Two times a day (BID) | ORAL | 5 refills | Status: DC

## 2017-01-21 MED ORDER — BENZONATATE 100 MG PO CAPS: 100.0000 mg | ORAL_CAPSULE | Freq: Three times a day (TID) | ORAL | Status: AC | PRN

## 2017-01-21 MED ORDER — TRAZODONE HCL 100 MG PO TABS: 100.0000 mg | ORAL_TABLET | Freq: Every day | ORAL | 5 refills | Status: DC

## 2017-01-21 NOTE — Progress Notes (Signed)
Patient: Julia Robertson Female    DOB: 1955/12/07   61 y.o.   MRN: [id number] Visit Date: 01/22/2017  Today's Provider: Trinna Post, PA-C   Chief Complaint  Patient presents with  . Hypertension  . Diabetes  . Hyperlipidemia   Subjective:    HPI   Diabetes Mellitus Type II, Follow-up:   Lab Results  Component Value Date   HGBA1C 6.9 01/21/2017   HGBA1C 6.7 05/23/2016   HGBA1C 7.6 (H) 11/10/2015   Last seen for diabetes 8 months ago.  Management since then includes Metformin 1000 mg BID. She reports excellent compliance with treatment. She is not having side effects.  Current symptoms include none and have been stable. Home blood sugar records: trend: Pt reports she has not checked her Blood Sugar recently.   Episodes of hypoglycemia? no   Most Recent Eye Exam: UTD, 10/2016 Weight trend: stable Current diet: in general, a "healthy" diet   Current exercise: Caring for grandchildren  ------------------------------------------------------------------------   Hypertension, follow-up:  BP Readings from Last 3 Encounters:  01/21/17 126/78  01/14/17 128/74  05/23/16 118/74    She was last seen for hypertension 8 months ago.  Management since that visit includes None.She reports excellent compliance with treatment. She is not having side effects.  She is exercising. She is adherent to low salt diet.   Outside blood pressures are 120's/70-80's. She is experiencing none.  Patient denies chest pain, fatigue, near-syncope, palpitations and syncope.   Cardiovascular risk factors include diabetes mellitus, dyslipidemia and hypertension.  ------------------------------------------------------------------------    Lipid/Cholesterol, Follow-up:   Last seen for this 8 months ago.  Management since that visit includes None.  Last Lipid Panel:    Component Value Date/Time   CHOL 205 (H) 11/10/2015 1124   TRIG 139 11/10/2015 1124   HDL 43 11/10/2015  1124   CHOLHDL 4.8 (H) 11/10/2015 1124   LDLCALC 134 (H) 11/10/2015 1124    She reports excellent compliance with treatment. She is not having side effects.   Wt Readings from Last 3 Encounters:  01/21/17 256 lb (116.1 kg)  01/14/17 254 lb (115.2 kg)  05/23/16 252 lb (114.3 kg)   Also needs refills on tramadol and trazodone for arthritic pain and insomnia. Takes both as needed and does well, no side effects. ------------------------------------------------------------------------  No Known Allergies   Current Outpatient Prescriptions:  .  BIOTIN PO, Take by mouth., Disp: , Rfl:  .  metFORMIN (GLUCOPHAGE) 1000 MG tablet, Take 1 tablet (1,000 mg total) by mouth 2 (two) times daily with a meal., Disp: 60 tablet, Rfl: 5 .  Multiple Vitamin (MULTIVITAMIN) capsule, Take 1 capsule by mouth daily., Disp: , Rfl:  .  traMADol (ULTRAM) 50 MG tablet, Take 1 tablet (50 mg total) by mouth every 6 (six) hours as needed., Disp: 60 tablet, Rfl: 5 .  traZODone (DESYREL) 100 MG tablet, Take 1 tablet (100 mg total) by mouth at bedtime., Disp: 30 tablet, Rfl: 5 .  triamterene-hydrochlorothiazide (MAXZIDE-25) 37.5-25 MG tablet, Take 1 tablet by mouth daily., Disp: 30 tablet, Rfl: 5 .  benzonatate (TESSALON PERLES) 100 MG capsule, Take 1 capsule (100 mg total) by mouth 3 (three) times daily as needed for cough., Disp: 21 capsule, Rfl: 00  Review of Systems  Constitutional: Negative.   Respiratory: Positive for cough and wheezing. Negative for apnea, choking, chest tightness, shortness of breath and stridor.   Cardiovascular: Negative.   Gastrointestinal: Negative.   Endocrine: Negative for cold  intolerance, heat intolerance, polydipsia, polyphagia and polyuria.  Musculoskeletal: Negative.   Neurological: Negative for dizziness, light-headedness and headaches.    Social History  Substance Use Topics  . Smoking status: Never Smoker  . Smokeless tobacco: Never Used  . Alcohol use No   Objective:    BP 126/78 (BP Location: Left Arm, Patient Position: Sitting, Cuff Size: Large)   Pulse 72   Temp 98.1 F (36.7 C) (Oral)   Resp 16   Wt 256 lb (116.1 kg)   BMI 43.26 kg/m   Physical Exam  Constitutional: She appears well-developed and well-nourished.  Cardiovascular: Normal rate, regular rhythm, normal heart sounds and intact distal pulses.   Pulmonary/Chest: Effort normal and breath sounds normal.  Abdominal: Soft. Bowel sounds are normal.  Neurological: She is alert.  Skin: Skin is warm and dry.  Psychiatric: She has a normal mood and affect. Her behavior is normal.       Diabetic Foot Exam - Simple   Simple Foot Form Diabetic Foot exam was performed with the following findings:  Yes 01/21/2017 11:40 AM  Visual Inspection Sensation Testing Intact to touch and monofilament testing bilaterally:  Yes Pulse Check Posterior Tibialis and Dorsalis pulse intact bilaterally:  Yes Comments         Assessment & Plan:     1. Hypercholesterolemia  Will check liver functions and lipid panel before initiating statin.  - Lipid panel  2. Type 2 diabetes mellitus without complication, without long-term current use of insulin (HCC)  A1C up today 6.9% from 6.7%. Still technically at goal. Encouraged Diet and exercise which patient says she will work on. Urine microalbumin slightly up today as well. Will not add diabetes medication today 2/2 potential hypoglycemia.   - Comprehensive metabolic panel - Lipid panel - POCT UA - Microalbumin - POCT glycosylated hemoglobin (Hb A1C) - metFORMIN (GLUCOPHAGE) 1000 MG tablet; Take 1 tablet (1,000 mg total) by mouth 2 (two) times daily with a meal.  Dispense: 60 tablet; Refill: 5  3. Essential hypertension  Normotensive today, labs as below.  - Comprehensive metabolic panel - Lipid panel - triamterene-hydrochlorothiazide (MAXZIDE-25) 37.5-25 MG tablet; Take 1 tablet by mouth daily.  Dispense: 30 tablet; Refill: 5  4. Cough  Patient  with viral illness last week, refilled today. - benzonatate (TESSALON PERLES) 100 MG capsule; Take 1 capsule (100 mg total) by mouth 3 (three) times daily as needed for cough.  Dispense: 21 capsule; Refill: 00  5. Chronic pain of right knee  Dx pulled for refill.   - traMADol (ULTRAM) 50 MG tablet; Take 1 tablet (50 mg total) by mouth every 6 (six) hours as needed.  Dispense: 60 tablet; Refill: 5  6. Insomnia, unspecified type  Dx pulled for refill.   - traZODone (DESYREL) 100 MG tablet; Take 1 tablet (100 mg total) by mouth at bedtime.  Dispense: 30 tablet; Refill: 5  The entirety of the information documented in the History of Present Illness, Review of Systems and Physical Exam were personally obtained by me. Portions of this information were initially documented by Ashley Royalty, CMA and reviewed by me for thoroughness and accuracy.   Return in about 6 weeks (around 03/04/2017) for diabetes, cholesterol meds.        Trinna Post, PA-C  [facility]

## 2017-01-21 NOTE — Patient Instructions (Signed)
Diabetes Mellitus and Exercise Exercising regularly is important for your overall health, especially when you have diabetes (diabetes mellitus). Exercising is not only about losing weight. It has many health benefits, such as increasing muscle strength and bone density and reducing body fat and stress. This leads to improved fitness, flexibility, and endurance, all of which result in better overall health. Exercise has additional benefits for people with diabetes, including:  Reducing appetite.  Helping to lower and control blood glucose.  Lowering blood pressure.  Helping to control amounts of fatty substances (lipids) in the blood, such as cholesterol and triglycerides.  Helping the body to respond better to insulin (improving insulin sensitivity).  Reducing how much insulin the body needs.  Decreasing the risk for heart disease by:  Lowering cholesterol and triglyceride levels.  Increasing the levels of good cholesterol.  Lowering blood glucose levels. What is my activity plan? Your health care provider or certified diabetes educator can help you make a plan for the type and frequency of exercise (activity plan) that works for you. Make sure that you:  Do at least 150 minutes of moderate-intensity or vigorous-intensity exercise each week. This could be brisk walking, biking, or water aerobics.  Do stretching and strength exercises, such as yoga or weightlifting, at least 2 times a week.  Spread out your activity over at least 3 days of the week.  Get some form of physical activity every day.  Do not go more than 2 days in a row without some kind of physical activity.  Avoid being inactive for more than 90 minutes at a time. Take frequent breaks to walk or stretch.  Choose a type of exercise or activity that you enjoy, and set realistic goals.  Start slowly, and gradually increase the intensity of your exercise over time. What do I need to know about managing my  diabetes?  Check your blood glucose before and after exercising.  If your blood glucose is higher than 240 mg/dL (13.3 mmol/L) before you exercise, check your urine for ketones. If you have ketones in your urine, do not exercise until your blood glucose returns to normal.  Know the symptoms of low blood glucose (hypoglycemia) and how to treat it. Your risk for hypoglycemia increases during and after exercise. Common symptoms of hypoglycemia can include:  Hunger.  Anxiety.  Sweating and feeling clammy.  Confusion.  Dizziness or feeling light-headed.  Increased heart rate or palpitations.  Blurry vision.  Tingling or numbness around the mouth, lips, or tongue.  Tremors or shakes.  Irritability.  Keep a rapid-acting carbohydrate snack available before, during, and after exercise to help prevent or treat hypoglycemia.  Avoid injecting insulin into areas of the body that are going to be exercised. For example, avoid injecting insulin into:  The arms, when playing tennis.  The legs, when jogging.  Keep records of your exercise habits. Doing this can help you and your health care provider adjust your diabetes management plan as needed. Write down:  Food that you eat before and after you exercise.  Blood glucose levels before and after you exercise.  The type and amount of exercise you have done.  When your insulin is expected to peak, if you use insulin. Avoid exercising at times when your insulin is peaking.  When you start a new exercise or activity, work with your health care provider to make sure the activity is safe for you, and to adjust your insulin, medicines, or food intake as needed.  Drink plenty   of water while you exercise to prevent dehydration or heat stroke. Drink enough fluid to keep your urine clear or pale yellow. This information is not intended to replace advice given to you by your health care provider. Make sure you discuss any questions you have with  your health care provider. Document Released: 02/01/2004 Document Revised: 05/31/2016 Document Reviewed: 04/22/2016 Elsevier Interactive Patient Education  2017 [employer]  

## 2017-03-04 ENCOUNTER — Ambulatory Visit: Payer: BLUE CROSS/BLUE SHIELD | Admitting: Physician Assistant

## 2017-04-07 ENCOUNTER — Other Ambulatory Visit: Payer: Self-pay | Admitting: Physician Assistant

## 2017-04-07 DIAGNOSIS — M25561 Pain in right knee: Principal | ICD-10-CM

## 2017-04-07 DIAGNOSIS — G8929 Other chronic pain: Secondary | ICD-10-CM

## 2017-04-07 NOTE — Telephone Encounter (Signed)
[place] [facility] faxed a request for the following medication. Thanks CC   traMADol (ULTRAM) 50 MG tablet  Take one tablet by mouth every 6 hours as needed.

## 2017-04-07 NOTE — Telephone Encounter (Signed)
Patient seen by Fabio Bering since Dr. Jerilynn Mages left

## 2017-04-08 NOTE — Telephone Encounter (Signed)
Last ov 01/21/17 Last filled 01/21/17

## 2017-04-10 MED ORDER — TRAMADOL HCL 50 MG PO TABS: 50.0000 mg | ORAL_TABLET | Freq: Four times a day (QID) | ORAL | 0 refills | Status: DC | PRN

## 2017-04-10 NOTE — Telephone Encounter (Signed)
Patient advised as below.  

## 2017-04-10 NOTE — Telephone Encounter (Signed)
No refills. Needs to get her labs and come into office for visit, she cancelled her last one.

## 2017-04-16 ENCOUNTER — Other Ambulatory Visit: Payer: Self-pay | Admitting: Family Medicine

## 2017-04-16 DIAGNOSIS — G8929 Other chronic pain: Secondary | ICD-10-CM

## 2017-04-16 DIAGNOSIS — M25561 Pain in right knee: Principal | ICD-10-CM

## 2017-04-16 NOTE — Telephone Encounter (Signed)
[place] pharmacy faxed a request on the following medication. Thanks CC  traMADol (ULTRAM) 50 MG tablet  Take one tablet by mouth every 6 hours as needed.

## 2017-04-22 IMAGING — MG MM DIGITAL SCREENING BILAT W/ CAD
7 series · 7 of 7 positions shown · non-contrast
Comparison: Previous exam(s).

CLINICAL DATA: Screening.

EXAM:
DIGITAL SCREENING BILATERAL MAMMOGRAM WITH CAD

[R CV]
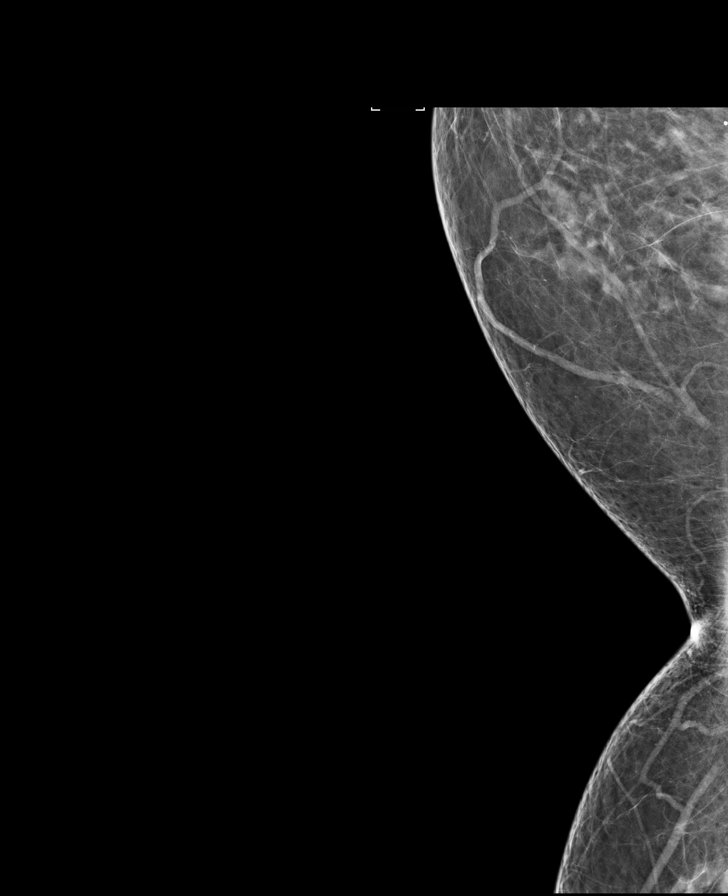

[L MLO]
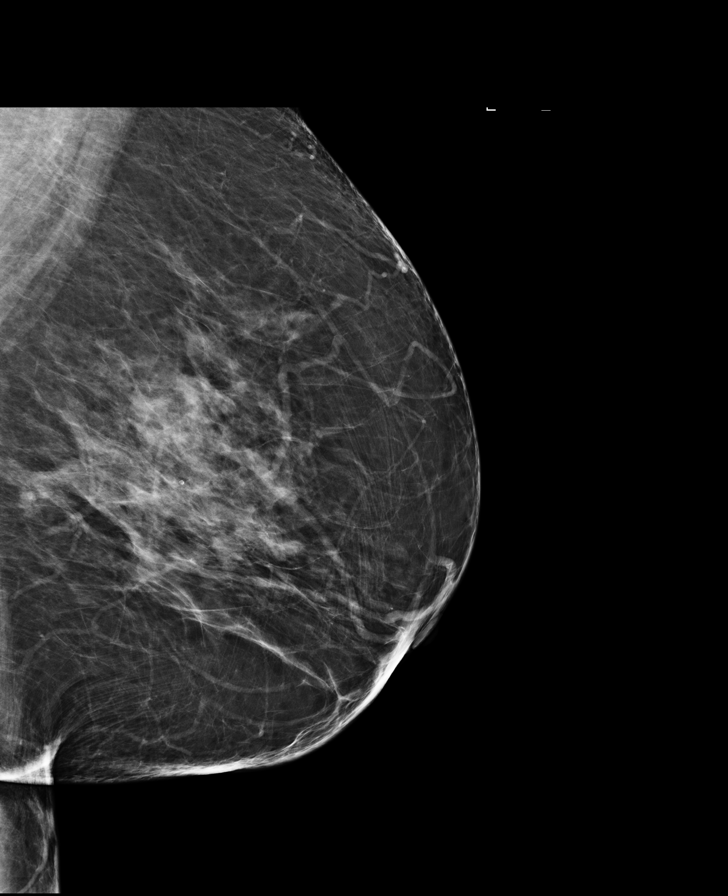

[R MLO (1 of 2)]
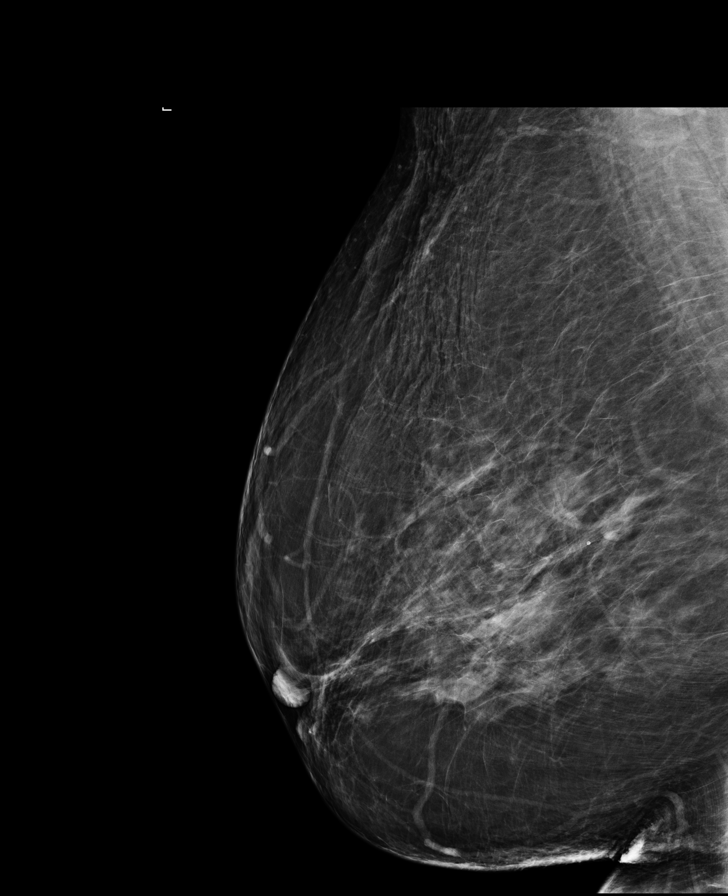

[L CV]
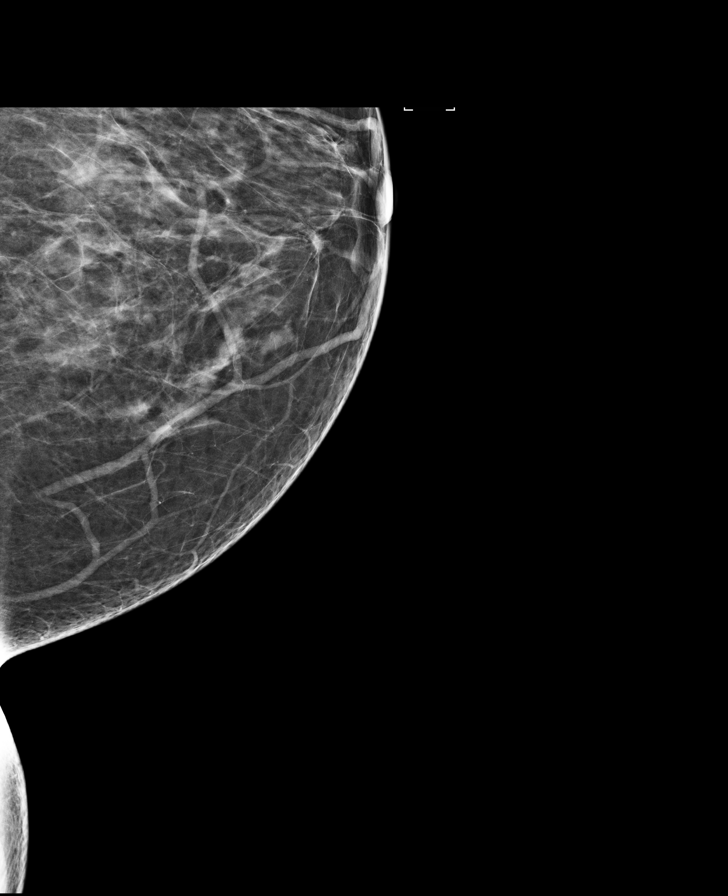

[L CC]
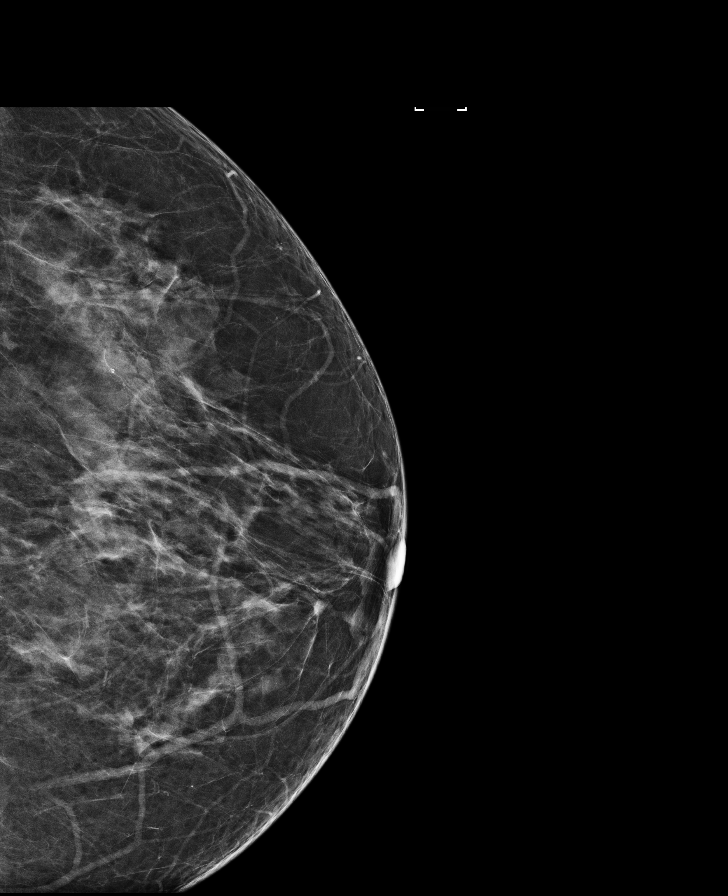

[R MLO (2 of 2)]
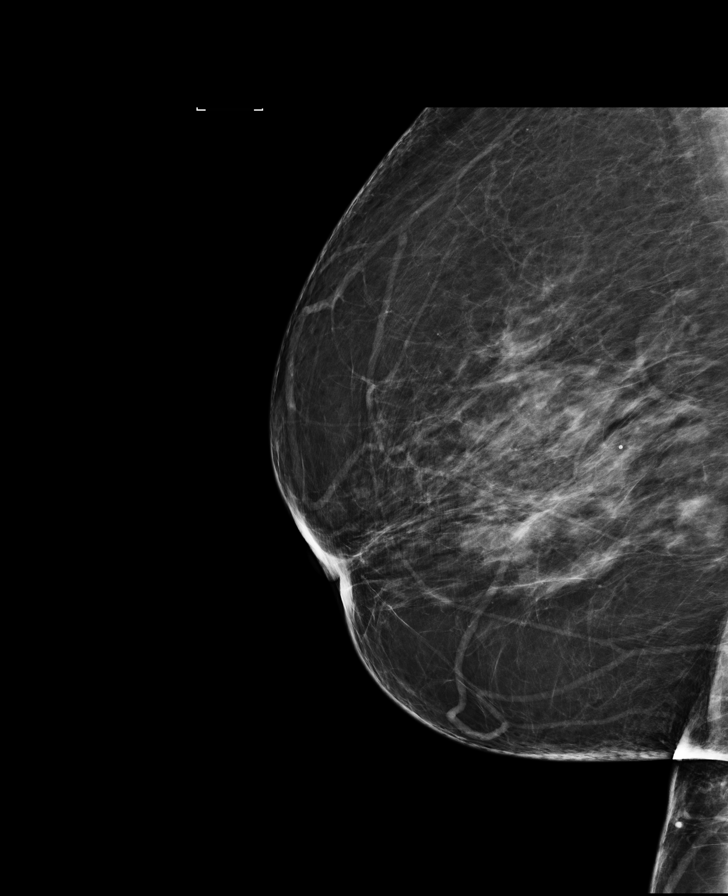

[R CC]
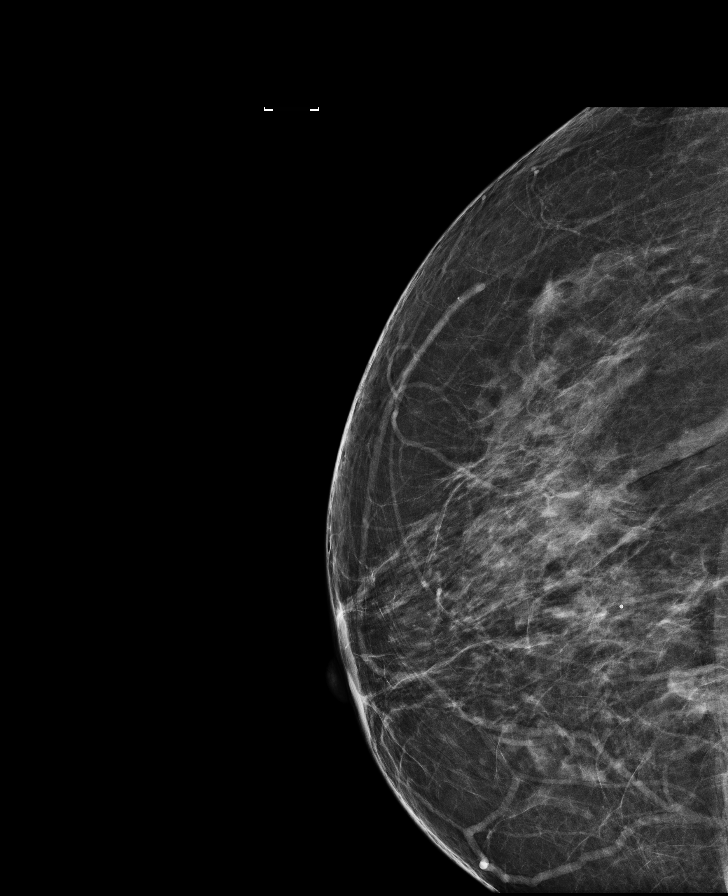

[7 of 7 positions shown; findings below may reference images not displayed]

ACR Breast Density Category c: The breast tissue is heterogeneously
dense, which may obscure small masses.
FINDINGS: There are no findings suspicious for malignancy. Images were
processed with CAD.
IMPRESSION: No mammographic evidence of malignancy. A result letter of this
screening mammogram will be mailed directly to the patient.

RECOMMENDATION:
Screening mammogram in one year. (Code:[id number])

BI-RADS CATEGORY  1: Negative.

## 2017-09-29 ENCOUNTER — Other Ambulatory Visit: Payer: Self-pay | Admitting: Physician Assistant

## 2017-09-29 DIAGNOSIS — I10 Essential (primary) hypertension: Secondary | ICD-10-CM

## 2017-09-30 NOTE — Telephone Encounter (Signed)
Needs OV for HTN/DM before more refills.

## 2017-11-23 ENCOUNTER — Other Ambulatory Visit: Payer: Self-pay | Admitting: Physician Assistant

## 2017-11-23 DIAGNOSIS — I10 Essential (primary) hypertension: Secondary | ICD-10-CM

## 2017-11-28 ENCOUNTER — Telehealth: Payer: Self-pay

## 2017-11-28 NOTE — Telephone Encounter (Signed)
No

## 2017-11-28 NOTE — Telephone Encounter (Signed)
Pt informed

## 2017-11-28 NOTE — Telephone Encounter (Signed)
Patient made an appointment to see you and follow up on 12/05/17, she does not have enough Maxzide to last. Can she get a refill until then at [facility], RMA

## 2017-12-05 ENCOUNTER — Ambulatory Visit: Payer: BLUE CROSS/BLUE SHIELD | Admitting: Physician Assistant

## 2017-12-05 ENCOUNTER — Encounter: Payer: Self-pay | Admitting: Physician Assistant

## 2017-12-05 VITALS — BP 150/86 | HR 84 | Temp 97.9°F | Resp 16 | Ht 65.0 in | Wt 266.0 lb

## 2017-12-05 DIAGNOSIS — E7800 Pure hypercholesterolemia, unspecified: Secondary | ICD-10-CM

## 2017-12-05 DIAGNOSIS — R809 Proteinuria, unspecified: Secondary | ICD-10-CM

## 2017-12-05 DIAGNOSIS — E1129 Type 2 diabetes mellitus with other diabetic kidney complication: Secondary | ICD-10-CM

## 2017-12-05 DIAGNOSIS — E1159 Type 2 diabetes mellitus with other circulatory complications: Secondary | ICD-10-CM

## 2017-12-05 DIAGNOSIS — E119 Type 2 diabetes mellitus without complications: Secondary | ICD-10-CM | POA: Diagnosis not present

## 2017-12-05 DIAGNOSIS — I10 Essential (primary) hypertension: Secondary | ICD-10-CM | POA: Diagnosis not present

## 2017-12-05 DIAGNOSIS — I152 Hypertension secondary to endocrine disorders: Secondary | ICD-10-CM

## 2017-12-05 LAB — POCT UA - MICROALBUMIN: Microalbumin Ur, POC: 50 mg/L

## 2017-12-05 LAB — POCT GLYCOSYLATED HEMOGLOBIN (HGB A1C): Hemoglobin A1C: 8.5

## 2017-12-05 NOTE — Progress Notes (Signed)
Patient: Julia Robertson Female    DOB: [birth year]   62 y.o.   MRN: [id number] Visit Date: 12/05/2017  Today's Provider: Trinna Post, PA-C   Chief Complaint  Patient presents with  . Hypertension  . Hyperlipidemia  . Diabetes   Subjective:    HPI   Julia Robertson is a 62 y/o woman presenting for follow up of HTN, DM, HLD. She was last seen 12/2016. She was supposed to come back in 6 weeks and did not. She has been out of her BP medications due to lack of follow up.   Diabetes Mellitus Type II, Follow-up:   Lab Results  Component Value Date   HGBA1C 6.9 01/21/2017   HGBA1C 6.7 05/23/2016   HGBA1C 7.6 (H) 11/10/2015   Last seen for diabetes 11 months ago.  Management since then includes none. She reports excellent compliance with treatment. She is not having side effects.  Current symptoms include none and have been stable. Home blood sugar records: Pt is not checking blood sugars at home.  Episodes of hypoglycemia? no   Current Insulin Regimen: None Most Recent Eye Exam: Pt needs referral for eye exam. Weight trend: stable Prior visit with dietician: no Current diet: Says she eats whatever she wants. Current exercise: none  ------------------------------------------------------------------------   Hypertension, follow-up:  BP Readings from Last 3 Encounters:  12/05/17 (!) 150/86  01/21/17 126/78  01/14/17 128/74    She was last seen for hypertension 11 months ago.  Management since that visit includes.She reports poor compliance with treatment. She did not follow up as specified and her Rx ran out. She is not having side effects.  She is not exercising. She is adherent to low salt diet.   Outside blood pressures are Pt reports blood pressure is running a little high. She is experiencing none.  Patient denies chest pain, exertional chest pressure/discomfort, irregular heart beat, lower extremity edema and paroxysmal nocturnal dyspnea.     Cardiovascular risk factors include diabetes mellitus, dyslipidemia, hypertension and obesity (BMI >= 30 kg/m2).  Use of agents associated with hypertension: none.   ------------------------------------------------------------------------    Lipid/Cholesterol, Follow-up:   Last seen for this 11 months ago.  Management since that visit includes None.  Last Lipid Panel:    Component Value Date/Time   CHOL 205 (H) 11/10/2015 1124   TRIG 139 11/10/2015 1124   HDL 43 11/10/2015 1124   CHOLHDL 4.8 (H) 11/10/2015 1124   LDLCALC 134 (H) 11/10/2015 1124    She reports excellent compliance with treatment. She is not having side effects.   Wt Readings from Last 3 Encounters:  12/05/17 266 lb (120.7 kg)  01/21/17 256 lb (116.1 kg)  01/14/17 254 lb (115.2 kg)    ------------------------------------------------------------------------      No Known Allergies   Current Outpatient Medications:  .  BIOTIN PO, Take by mouth., Disp: , Rfl:  .  metFORMIN (GLUCOPHAGE) 1000 MG tablet, Take 1 tablet (1,000 mg total) by mouth 2 (two) times daily with a meal., Disp: 60 tablet, Rfl: 5 .  Multiple Vitamin (MULTIVITAMIN) capsule, Take 1 capsule by mouth daily., Disp: , Rfl:  .  traMADol (ULTRAM) 50 MG tablet, Take 1 tablet (50 mg total) by mouth every 6 (six) hours as needed., Disp: 60 tablet, Rfl: 0 .  traZODone (DESYREL) 100 MG tablet, Take 1 tablet (100 mg total) by mouth at bedtime., Disp: 30 tablet, Rfl: 5 .  triamterene-hydrochlorothiazide (MAXZIDE-25) 37.5-25 MG tablet, TAKE  1 TABLET BY MOUTH ONCE DAILY, Disp: 30 tablet, Rfl: 0  Review of Systems  Constitutional: Positive for appetite change (Pt reports not having much of an appetite recently). Negative for activity change, chills, diaphoresis, fatigue, fever and unexpected weight change.  Respiratory: Negative.   Cardiovascular: Negative.   Gastrointestinal: Positive for diarrhea (Chronic). Negative for abdominal distention,  abdominal pain, anal bleeding, blood in stool, constipation, nausea, rectal pain and vomiting.  Musculoskeletal: Positive for arthralgias (History of arthritis.). Negative for back pain, gait problem, joint swelling, myalgias, neck pain and neck stiffness.  Neurological: Positive for headaches. Negative for dizziness and light-headedness.    Social History   Tobacco Use  . Smoking status: Never Smoker  . Smokeless tobacco: Never Used  Substance Use Topics  . Alcohol use: No   Objective:   BP (!) 150/86 (BP Location: Right Arm, Patient Position: Sitting, Cuff Size: Large)   Pulse 84   Temp 97.9 F (36.6 C) (Oral)   Resp 16   Ht 5\' 5"  (1.651 m)   Wt 266 lb (120.7 kg)   BMI 44.26 kg/m  Vitals:   12/05/17 0905  BP: (!) 150/86  Pulse: 84  Resp: 16  Temp: 97.9 F (36.6 C)  TempSrc: Oral  Weight: 266 lb (120.7 kg)  Height: 5\' 5"  (1.651 m)     Physical Exam  Constitutional: She is oriented to person, place, and time. She appears well-developed and well-nourished.  Cardiovascular: Normal rate and regular rhythm.  Pulmonary/Chest: Effort normal and breath sounds normal.  Abdominal: Soft. Bowel sounds are normal. She exhibits no distension.  Protuberant abdomen due to obesity, difficult to palpate any organomegaly.   Neurological: She is alert and oriented to person, place, and time.  Skin: Skin is warm and dry.  Psychiatric: She has a normal mood and affect. Her behavior is normal.    Diabetic Foot Exam - Simple   Simple Foot Form Diabetic Foot exam was performed with the following findings:  Yes 12/05/2017  9:31 AM  Visual Inspection See comments:  Yes Sensation Testing Intact to touch and monofilament testing bilaterally:  Yes Pulse Check Posterior Tibialis and Dorsalis pulse intact bilaterally:  Yes Comments Right great toe with onychomycosis. Fungal infection on sole of right foot. No visible ulcerations or lesions on feet bilaterally.          Assessment &  Plan:     1. Type 2 diabetes mellitus with microalbuminuria, without long-term current use of insulin (HCC)   Her diabetes has worsened, increasing from 6.9% A1c to 8.5% today. She continues to exhibit microalbuminuria and she is not on ACEi. Her cholesterol is elevated and she requires a statin. I will need her labwork before I can initiate medications, but I plan to start Januvia and Lisinopril. She will need to return in three months so we can initiate statin. Have been explicit that medications are no substitute for lifestyle changes including a diabetic diet and increasing exercise to 30 min daily. She should not continue to "eat what she wants." Have referred for eye exam as well.  Labwork has returned, and she can tolerate Lisinopril and Januvia. Will also need statin at future visit.  - Ambulatory referral to Ophthalmology - Comprehensive Metabolic Panel (CMET) - Lipid Profile - POCT HgB A1C - POCT UA - Microalbumin - sitaGLIPtin (JANUVIA) 100 MG tablet; Take 1 tablet (100 mg total) by mouth daily.  Dispense: 30 tablet; Refill: 2  2. Hypercholesterolemia  - Comprehensive Metabolic Panel (CMET) -  Lipid Profile  3. Hypertension associated with diabetes ([place])  - Comprehensive Metabolic Panel (CMET) - Lipid Profile - lisinopril (PRINIVIL,ZESTRIL) 10 MG tablet; Take 1 tablet (10 mg total) by mouth daily.  Dispense: 90 tablet; Refill: 3   The entirety of the information documented in the History of Present Illness, Review of Systems and Physical Exam were personally obtained by me. Portions of this information were initially documented by Ashley Royalty, CMA and reviewed by me for thoroughness and accuracy.   Return in about 3 months (around 03/05/2018) for DM, HTN, HLD.        Trinna Post, PA-C  [facility]

## 2017-12-05 NOTE — Patient Instructions (Signed)

## 2017-12-06 LAB — COMPREHENSIVE METABOLIC PANEL
ALT: 46 IU/L — ABNORMAL HIGH (ref 0–32)
AST: 51 IU/L — ABNORMAL HIGH (ref 0–40)
Albumin/Globulin Ratio: 1 — ABNORMAL LOW (ref 1.2–2.2)
Albumin: 3.9 g/dL (ref 3.5–5.5)
Alkaline Phosphatase: 71 IU/L (ref 39–117)
BUN/Creatinine Ratio: 11 (ref 9–23)
BUN: 8 mg/dL (ref 6–24)
Bilirubin Total: 0.4 mg/dL (ref 0.0–1.2)
CO2: 27 mmol/L (ref 20–29)
Calcium: 9.2 mg/dL (ref 8.7–10.2)
Chloride: 100 mmol/L (ref 96–106)
Creatinine, Ser: 0.76 mg/dL (ref 0.57–1.00)
GFR calc Af Amer: 99 mL/min/{1.73_m2} (ref >59)
GFR calc non Af Amer: 86 mL/min/{1.73_m2} (ref >59)
Globulin, Total: 3.8 g/dL (ref 1.5–4.5)
Glucose: 162 mg/dL — ABNORMAL HIGH (ref 65–99)
Potassium: 3.7 mmol/L (ref 3.5–5.2)
Sodium: 141 mmol/L (ref 134–144)
Total Protein: 7.7 g/dL (ref 6.0–8.5)

## 2017-12-06 LAB — LIPID PANEL
Chol/HDL Ratio: 4.8 ratio — ABNORMAL HIGH (ref 0.0–4.4)
Cholesterol, Total: 210 mg/dL — ABNORMAL HIGH (ref 100–199)
HDL: 44 mg/dL (ref >39)
LDL Calculated: 138 mg/dL — ABNORMAL HIGH (ref 0–99)
Triglycerides: 140 mg/dL (ref 0–149)
VLDL Cholesterol Cal: 28 mg/dL (ref 5–40)

## 2017-12-09 ENCOUNTER — Telehealth: Payer: Self-pay

## 2017-12-09 DIAGNOSIS — M25561 Pain in right knee: Principal | ICD-10-CM

## 2017-12-09 DIAGNOSIS — G8929 Other chronic pain: Secondary | ICD-10-CM

## 2017-12-09 MED ORDER — SITAGLIPTIN PHOSPHATE 100 MG PO TABS: 100.0000 mg | ORAL_TABLET | Freq: Every day | ORAL | 2 refills | Status: DC

## 2017-12-09 MED ORDER — LISINOPRIL 10 MG PO TABS: 10.0000 mg | ORAL_TABLET | Freq: Every day | ORAL | 3 refills | Status: DC

## 2017-12-09 MED ORDER — TRAMADOL HCL 50 MG PO TABS: 50.0000 mg | ORAL_TABLET | Freq: Three times a day (TID) | ORAL | 0 refills | Status: DC | PRN

## 2017-12-09 NOTE — Telephone Encounter (Signed)
-----   Message from Trinna Post, [place] sent at 12/09/2017  9:11 AM EST ----- Renal function can handle Lisinopril for blood pressure and also Januvia for diabetes. I will send in for 10 mg Lisinopril daily and 100 mg Januvia daily to be taken along with her metformin. Her cholesterol remains elevated and she will need statin as well. Additionally, her liver enzymes are elevated and will need some further labwork, imaging at follow up.

## 2017-12-09 NOTE — Telephone Encounter (Signed)
Pt advised.  She agreed to started the medications including a statin.  She would also like to have a refill  tramadol for her arthritis. Pharmacy: [address]   Thanks,   -Mickel Baas

## 2017-12-09 NOTE — Telephone Encounter (Signed)
I was going to start only the Lisinopril and the Januvia and have her start a statin at the follow up visit, as with three new medications it may be difficult to distinguish side effects. We did not discuss her arthritis, but I will send in 15 tablets of tramadol until her next visit since I know this is a chronic problem. Also, her next visit is with Tawanna Sat, who is listed as her provider but who has never seen her. It is fine if this is whom she wishes to see, but there should be only one provider managing her chronic illnesses. At that appointment, Tawanna Sat may reassess and decide on appropriateness of her treatments and continue prescriptions.

## 2017-12-09 NOTE — Telephone Encounter (Signed)
Tried calling; pt's voicemail box is full.   Thanks,   -Dajae Kizer  

## 2017-12-10 NOTE — Telephone Encounter (Signed)
LMTCB  12/10/2017  Thanks,   -Mickel Baas

## 2018-01-06 DIAGNOSIS — H25013 Cortical age-related cataract, bilateral: Secondary | ICD-10-CM | POA: Diagnosis not present

## 2018-01-06 LAB — HM DIABETES EYE EXAM

## 2018-01-09 ENCOUNTER — Encounter: Payer: Self-pay | Admitting: Physician Assistant

## 2018-03-05 ENCOUNTER — Ambulatory Visit: Payer: BLUE CROSS/BLUE SHIELD | Admitting: Physician Assistant

## 2018-06-03 ENCOUNTER — Ambulatory Visit
Admission: RE | Admit: 2018-06-03 | Discharge: 2018-06-03 | Disposition: A | Discharge status: Home / Self Care | Payer: BLUE CROSS/BLUE SHIELD | Source: Ambulatory Visit | Attending: Anesthesiology | Admitting: Anesthesiology

## 2018-06-03 ENCOUNTER — Encounter: Payer: Self-pay | Admitting: Physician Assistant

## 2018-06-03 ENCOUNTER — Ambulatory Visit: Payer: BLUE CROSS/BLUE SHIELD | Admitting: Physician Assistant

## 2018-06-03 ENCOUNTER — Ambulatory Visit
Admission: RE | Admit: 2018-06-03 | Discharge: 2018-06-03 | Disposition: A | Discharge status: Home / Self Care | Payer: BLUE CROSS/BLUE SHIELD | Source: Ambulatory Visit | Attending: Physician Assistant | Admitting: Physician Assistant

## 2018-06-03 VITALS — BP 136/70 | HR 84 | Temp 98.4°F | Resp 16 | Wt 264.0 lb

## 2018-06-03 DIAGNOSIS — E119 Type 2 diabetes mellitus without complications: Secondary | ICD-10-CM

## 2018-06-03 DIAGNOSIS — M79641 Pain in right hand: Secondary | ICD-10-CM | POA: Diagnosis not present

## 2018-06-03 DIAGNOSIS — M25571 Pain in right ankle and joints of right foot: Secondary | ICD-10-CM | POA: Diagnosis not present

## 2018-06-03 DIAGNOSIS — M19071 Primary osteoarthritis, right ankle and foot: Secondary | ICD-10-CM | POA: Diagnosis not present

## 2018-06-03 DIAGNOSIS — M79671 Pain in right foot: Secondary | ICD-10-CM | POA: Diagnosis not present

## 2018-06-03 MED ORDER — SEMAGLUTIDE(0.25 OR 0.5MG/DOS) 2 MG/1.5ML ~~LOC~~ SOPN: 0.5000 mg | PEN_INJECTOR | SUBCUTANEOUS | 3 refills | Status: AC

## 2018-06-03 MED ORDER — SEMAGLUTIDE(0.25 OR 0.5MG/DOS) 2 MG/1.5ML ~~LOC~~ SOPN: 0.2500 mg | PEN_INJECTOR | SUBCUTANEOUS | 0 refills | Status: DC

## 2018-06-03 NOTE — Patient Instructions (Signed)

## 2018-06-03 NOTE — Progress Notes (Signed)
Patient: Julia Robertson Female    DOB: 11-10-1956   62 y.o.   MRN: [id number] Visit Date: 06/04/2018  Today's Provider: Trinna Post, PA-C   Chief Complaint  Patient presents with  . Diabetes  . Hypertension  . Hyperlipidemia  . Ankle Pain    Right ankle   Subjective:   Not taking Januvia and hasn't in three months. Says it made her hair fall out. She is taking Metformin. A1c today is 8.1% down from 8.6% last time. She is taking Lisinopril for HTN.   Also reports right ankle pain and intermittent swelling. Tender to touch on medial malleolus. No injury. Denies history of gout.  Also reports right wrist and hand pain. Says she hit it at work and a large lump popped up that was nor present before. No loss of motion, strength, or feeling.  Diabetes  She presents for her follow-up diabetic visit. She has type 2 diabetes mellitus. Her disease course has been worsening. There are no hypoglycemic associated symptoms. Pertinent negatives for diabetes include no blurred vision, no chest pain, no fatigue, no foot paresthesias, no foot ulcerations, no polydipsia, no polyphagia, no polyuria, no visual change, no weakness and no weight loss. There are no hypoglycemic complications. Current diabetic treatment includes oral agent (dual therapy). She is compliant with treatment all of the time. She is following a diabetic diet. She rarely participates in exercise. Her home blood glucose trend is increasing steadily. An ACE inhibitor/angiotensin II receptor blocker is being taken. She does not see a podiatrist.Eye exam is current.  Hypertension  This is a chronic problem. The problem is unchanged. The problem is controlled. Associated symptoms include malaise/fatigue and peripheral edema. Pertinent negatives include no blurred vision, chest pain or shortness of breath. There are no associated agents to hypertension. Past treatments include ACE inhibitors. There are no compliance problems.     Hyperlipidemia  This is a chronic problem. Recent lipid tests were reviewed and are normal. Pertinent negatives include no chest pain, focal sensory loss, focal weakness, leg pain, myalgias or shortness of breath. There are no compliance problems.   Ankle Pain   There was no injury mechanism. The pain is present in the right ankle. The quality of the pain is described as stabbing. The pain has been worsening since onset. Pertinent negatives include no inability to bear weight, loss of motion, loss of sensation, muscle weakness, numbness or tingling. She reports no foreign bodies present. The symptoms are aggravated by palpation and movement. She has tried acetaminophen for the symptoms. The treatment provided mild relief.       No Known Allergies   Current Outpatient Medications:  .  BIOTIN PO, Take by mouth., Disp: , Rfl:  .  lisinopril (PRINIVIL,ZESTRIL) 10 MG tablet, Take 1 tablet (10 mg total) by mouth daily., Disp: 90 tablet, Rfl: 3 .  metFORMIN (GLUCOPHAGE) 1000 MG tablet, Take 1 tablet (1,000 mg total) by mouth 2 (two) times daily with a meal., Disp: 60 tablet, Rfl: 5 .  Multiple Vitamin (MULTIVITAMIN) capsule, Take 1 capsule by mouth daily., Disp: , Rfl:  .  Semaglutide (OZEMPIC) 0.25 or 0.5 MG/DOSE SOPN, Inject 0.5 mg into the skin once a week., Disp: 1.5 mL, Rfl: 3 .  traMADol (ULTRAM) 50 MG tablet, Take 1 tablet (50 mg total) by mouth every 8 (eight) hours as needed for severe pain., Disp: 15 tablet, Rfl: 0 .  traZODone (DESYREL) 100 MG tablet, Take 1 tablet (100  mg total) by mouth at bedtime., Disp: 30 tablet, Rfl: 5  Review of Systems  Constitutional: Positive for malaise/fatigue. Negative for fatigue and weight loss.  Eyes: Negative for blurred vision.  Respiratory: Negative for shortness of breath.   Cardiovascular: Negative for chest pain.  Endocrine: Negative for polydipsia, polyphagia and polyuria.  Musculoskeletal: Negative for myalgias.  Neurological: Negative for  tingling, focal weakness, weakness and numbness.    Social History   Tobacco Use  . Smoking status: Never Smoker  . Smokeless tobacco: Never Used  Substance Use Topics  . Alcohol use: No   Objective:   BP 136/70 (BP Location: Right Arm, Patient Position: Sitting, Cuff Size: Large)   Pulse 84   Temp 98.4 F (36.9 C) (Oral)   Resp 16   Wt 264 lb (119.7 kg)   BMI 43.93 kg/m  Vitals:   06/03/18 0903  BP: 136/70  Pulse: 84  Resp: 16  Temp: 98.4 F (36.9 C)  TempSrc: Oral  Weight: 264 lb (119.7 kg)     Physical Exam  Constitutional: She is oriented to person, place, and time. She appears well-developed and well-nourished.  Cardiovascular: Normal rate and regular rhythm.  Pulmonary/Chest: Effort normal and breath sounds normal.  Musculoskeletal: Normal range of motion. She exhibits tenderness and deformity. She exhibits no edema.       Right ankle: She exhibits no deformity. Tenderness. Medial malleolus tenderness found. No lateral malleolus tenderness found.  On the dorsum of her right hand there are hard, discrete nodule   Neurological: She is alert and oriented to person, place, and time.  Skin: Skin is warm and dry.  Psychiatric: She has a normal mood and affect. Her behavior is normal.        Assessment & Plan:     1. Type 2 diabetes mellitus without complication, without long-term current use of insulin (HCC)  A1c today is 8.1% down from last A1c. Patient has self discontinued Januvia and is now only taking metformin. Diabetes is poorly controlled. Will start Ozempic today. She denies history of thyroid cancer, MEN, sensitivity to the medication. I have instructed her about the use of this medication and done the first dose with her in office. She has been instructed to inject 0.25 mg subq once weekly and I have given her a sample pen. The prescription I have written will start out at the increased dose of 0.5 mg subQ weekly. Counseled on side effects. She will come  back in roughly 1 month to review compliance with this medication and to perform CPE + PAP.   - POCT HgB A1C - DG Foot Complete Right; Future - DG Ankle Complete Right; Future - Semaglutide (OZEMPIC) 0.25 or 0.5 MG/DOSE SOPN; Inject 0.5 mg into the skin once a week.  Dispense: 1.5 mL; Refill: 3  2. Acute right ankle pain   3. Right hand pain  - DG Hand Complete Right; Future - DG Wrist Complete Right; Future  I have spent 40 minutes with this patient, >50% of which was spent on counseling, education, and coordination of care.  Return in about 1 month (around 07/04/2018) for DM/CPE.  The entirety of the information documented in the History of Present Illness, Review of Systems and Physical Exam were personally obtained by me. Portions of this information were initially documented by Ashley Royalty, CMA and reviewed by me for thoroughness and accuracy.        Trinna Post, PA-C  [facility]

## 2018-06-04 ENCOUNTER — Telehealth: Payer: Self-pay

## 2018-06-04 LAB — POCT GLYCOSYLATED HEMOGLOBIN (HGB A1C): Hemoglobin A1C: 8.1 % — AB (ref 4.0–5.6)

## 2018-06-04 NOTE — Telephone Encounter (Signed)
-----   Message from Trinna Post, [place] sent at 06/04/2018  8:41 AM EDT ----- Foot and ankle xrays are normal. Unsure of what is causing her foot swelling, have her take a picture when it happens and not wear any tight shoes.

## 2018-06-04 NOTE — Telephone Encounter (Signed)
-----   Message from Trinna Post, [place] sent at 06/04/2018  8:42 AM EDT ----- Hand and wrist xray show no abnormality. Suspect the spot she was referring to is a ganglion cyst. I can refer her to orthopedics if she wishes it removed or we can discuss further in clinic when she comes back.

## 2018-06-04 NOTE — Telephone Encounter (Signed)
Pt advised.  She would like to wait on the orthopedic for now.   Thanks,   -Mickel Baas

## 2018-06-05 ENCOUNTER — Other Ambulatory Visit: Payer: Self-pay | Admitting: Physician Assistant

## 2018-06-05 DIAGNOSIS — I10 Essential (primary) hypertension: Secondary | ICD-10-CM

## 2018-06-05 DIAGNOSIS — E119 Type 2 diabetes mellitus without complications: Secondary | ICD-10-CM

## 2018-06-05 DIAGNOSIS — M25561 Pain in right knee: Secondary | ICD-10-CM

## 2018-06-05 DIAGNOSIS — E1159 Type 2 diabetes mellitus with other circulatory complications: Secondary | ICD-10-CM

## 2018-06-05 DIAGNOSIS — G8929 Other chronic pain: Secondary | ICD-10-CM

## 2018-06-05 DIAGNOSIS — I152 Hypertension secondary to endocrine disorders: Secondary | ICD-10-CM

## 2018-06-05 DIAGNOSIS — G4700 Insomnia, unspecified: Secondary | ICD-10-CM

## 2018-06-05 MED ORDER — METFORMIN HCL 1000 MG PO TABS: 1000.0000 mg | ORAL_TABLET | Freq: Two times a day (BID) | ORAL | 5 refills | Status: DC

## 2018-06-05 MED ORDER — TRAZODONE HCL 100 MG PO TABS: 100.0000 mg | ORAL_TABLET | Freq: Every day | ORAL | 5 refills | Status: DC

## 2018-06-05 MED ORDER — TRAMADOL HCL 50 MG PO TABS: 50.0000 mg | ORAL_TABLET | Freq: Three times a day (TID) | ORAL | 0 refills | Status: DC | PRN

## 2018-06-05 MED ORDER — LISINOPRIL 10 MG PO TABS: 10.0000 mg | ORAL_TABLET | Freq: Every day | ORAL | 3 refills | Status: DC

## 2018-06-05 NOTE — Telephone Encounter (Signed)
Pt was in the office on Wednesday and ask for refills on her medications.  She states as of today the pharmacy has not received anything  She uses [facility]  BC# [phone]  Thanks Con Memos

## 2018-07-24 ENCOUNTER — Encounter: Payer: BLUE CROSS/BLUE SHIELD | Admitting: Physician Assistant

## 2018-09-16 ENCOUNTER — Telehealth: Payer: Self-pay | Admitting: Physician Assistant

## 2018-09-30 ENCOUNTER — Other Ambulatory Visit (HOSPITAL_COMMUNITY)
Admission: RE | Admit: 2018-09-30 | Discharge: 2018-09-30 | Disposition: A | Discharge status: Home / Self Care | Payer: BLUE CROSS/BLUE SHIELD | Source: Ambulatory Visit | Attending: Physician Assistant | Admitting: Physician Assistant

## 2018-09-30 ENCOUNTER — Ambulatory Visit (INDEPENDENT_AMBULATORY_CARE_PROVIDER_SITE_OTHER): Payer: BLUE CROSS/BLUE SHIELD | Admitting: Physician Assistant

## 2018-09-30 ENCOUNTER — Encounter: Payer: Self-pay | Admitting: Physician Assistant

## 2018-09-30 VITALS — BP 158/88 | HR 80 | Temp 98.2°F | Resp 16 | Ht 64.5 in | Wt 255.4 lb

## 2018-09-30 DIAGNOSIS — G6289 Other specified polyneuropathies: Secondary | ICD-10-CM | POA: Diagnosis not present

## 2018-09-30 DIAGNOSIS — Z114 Encounter for screening for human immunodeficiency virus [HIV]: Secondary | ICD-10-CM

## 2018-09-30 DIAGNOSIS — E1159 Type 2 diabetes mellitus with other circulatory complications: Secondary | ICD-10-CM

## 2018-09-30 DIAGNOSIS — E7800 Pure hypercholesterolemia, unspecified: Secondary | ICD-10-CM | POA: Diagnosis not present

## 2018-09-30 DIAGNOSIS — I152 Hypertension secondary to endocrine disorders: Secondary | ICD-10-CM

## 2018-09-30 DIAGNOSIS — E119 Type 2 diabetes mellitus without complications: Secondary | ICD-10-CM | POA: Diagnosis not present

## 2018-09-30 DIAGNOSIS — Z0000 Encounter for general adult medical examination without abnormal findings: Secondary | ICD-10-CM | POA: Diagnosis not present

## 2018-09-30 DIAGNOSIS — I10 Essential (primary) hypertension: Secondary | ICD-10-CM

## 2018-09-30 DIAGNOSIS — Z124 Encounter for screening for malignant neoplasm of cervix: Secondary | ICD-10-CM | POA: Diagnosis not present

## 2018-09-30 DIAGNOSIS — Z1239 Encounter for other screening for malignant neoplasm of breast: Secondary | ICD-10-CM | POA: Diagnosis not present

## 2018-09-30 DIAGNOSIS — Z23 Encounter for immunization: Secondary | ICD-10-CM | POA: Diagnosis not present

## 2018-09-30 DIAGNOSIS — Z1159 Encounter for screening for other viral diseases: Secondary | ICD-10-CM

## 2018-09-30 MED ORDER — LISINOPRIL 20 MG PO TABS: 20.0000 mg | ORAL_TABLET | Freq: Every day | ORAL | 0 refills | Status: DC

## 2018-09-30 MED ORDER — GABAPENTIN 300 MG PO CAPS: 300.0000 mg | ORAL_CAPSULE | Freq: Every day | ORAL | 0 refills | Status: DC

## 2018-09-30 NOTE — Patient Instructions (Signed)
Please start ozempic 0.25 mg once weekly and continue for one month. Then start ozempic 0.5 mg weekly for one month onward.

## 2018-09-30 NOTE — Progress Notes (Signed)
Patient: Julia Robertson, Female    DO: [birth year], 62 y.o.   MRM: [id number] Visit Date: 09/30/2018  Today's Provider: Trinna Post, PA-C   Chief Complaint  Patient presents with  . Annual Exam   Subjective:    Annual physical exam Julia Robertson is a 62 y.o. female who presents today for health maintenance and complete physical. She feels well. She reports exercising none. She reports she is sleeping well.  Mammo: due PAP: due Tetanus: due Colonoscopy: 2017 tubular adenoma and hyperplastic polyp, repeat 5 years.  HTN: Currently taking Lisinopril 10 mg daily. Says she is taking it every day. Denies chest pain and headache.  BP Readings from Last 3 Encounters:  09/30/18 (!) 158/88  06/03/18 136/70  12/05/17 (!) 150/86   DM II: Reports her fasting sugars: 154 - 200. Reports she is taking metformin 1000 mg BID. She was taking ozempic 0.25 mg subQ x 1 month without issue but says she went on vacation, did not take it with her and then "did not get back into the routine." She has not been taking it for > 1 month. Reports tingling, burning and numbness in her feet.   HLD: Cholesterol elevated. Currently not on statin.      Component Value Date/Time   CHOL 206 (H) 09/30/2018 0912   TRIG 168 (H) 09/30/2018 0912   HDL 41 09/30/2018 0912   CHOLHDL 4.8 (H) 12/05/2017 0936   LDLCALC 131 (H) 09/30/2018 0912      Patient C/O tingling, burning and numbness on feet. -----------------------------------------------------------------   Review of Systems  Constitutional: Negative.   HENT: Negative.   Eyes: Negative.   Respiratory: Negative.   Cardiovascular: Negative.   Gastrointestinal: Negative.   Endocrine: Negative.   Genitourinary: Negative.   Musculoskeletal: Positive for arthralgias.  Skin: Negative.   Allergic/Immunologic: Negative.   Hematological: Negative.   Psychiatric/Behavioral: Negative.     Social History      She  reports that she  has never smoked. She has never used smokeless tobacco. She reports that she does not drink alcohol or use drugs.       Social History   Socioeconomic History  . Marital status: Married    Spouse name: Not on file  . Number of children: Not on file  . Years of education: Not on file  . Highest education level: Not on file  Occupational History  . 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 Financial resource strain: Not on file  . Food insecurity:    Worry: Not on file    Inability: Not on file  . Transportation needs:    Medical: Not on file    Non-medical: Not on file  Tobacco Use  . Smoking status: Never Smoker  . Smokeless tobacco: Never Used  Substance and Sexual Activity  . Alcohol use: No  . Drug use: No  . Sexual activity: Not on file  Lifestyle  . Physical activity:    Days per week: Not on file    Minutes per session: Not on file  . Stress: Not on file  Relationships  . Social connections:    Talks on phone: Not on file    Gets together: Not on file    Attends religious service: Not on file    Active member of club or organization: Not on file    Attends meetings of clubs or  organizations: Not on file    Relationship status: Not on file  Other Topics Concern  . Not on file  Social History Narrative  . Not on file    Past Medical History:  Diagnosis Date  . Diabetes mellitus without complication ([place])   . Hypertension      Patient Active Problem List   Diagnosis Date Noted  . Benign neoplasm of descending colon   . Personal history of colonic polyps   . Diabetes mellitus type 2, uncomplicated ([place]) 35/36/1443  . Elevated liver enzymes 11/13/2015  . Hypokalemia 11/13/2015  . Avitaminosis D 10/10/2015  . Insomnia 10/10/2015  . Knee pain 10/10/2015  . Hypercholesterolemia 03/08/2010  . Morbid obesity ([place]) 02/02/2010  . Cannot sleep 12/25/2005  . Absolute anemia 12/03/2005  . Hypertension  associated with diabetes ([place]) 02/04/2000    Past Surgical History:  Procedure Laterality Date  . CHOLECYSTECTOMY    . COLONOSCOPY WITH PROPOFOL N/A 11/28/2015   Procedure: COLONOSCOPY WITH PROPOFOL;  Surgeon: Lucilla Lame, MD;  Location: [facility] ENDOSCOPY;  Service: Endoscopy;  Laterality: N/A;  . 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 Rudene [facility] at [facility]    Family History        Family Status  Relation Name Status  . Mother  Deceased  . Father  Deceased  . Sister ##Sister1 Alive  . Brother ##Brother1 Deceased  . Brother ##Brother2 Deceased  . Brother ##Brother3 Alive  . Brother ##Brother4 Alive  . Brother ##Brother5 Alive        Her family history includes Cancer in her father and mother; Diabetes in her brother; Hypertension in her brother, brother, brother, brother, and sister; Kidney disease in her brother and brother. There is no history of Breast cancer.      No Known Allergies   Current Outpatient Medications:  .  BIOTIN PO, Take by mouth., Disp: , Rfl:  .  lisinopril (PRINIVIL,ZESTRIL) 10 MG tablet, Take 1 tablet (10 mg total) by mouth daily., Disp: 90 tablet, Rfl: 3 .  metFORMIN (GLUCOPHAGE) 1000 MG tablet, Take 1 tablet (1,000 mg total) by mouth 2 (two) times daily with a meal., Disp: 60 tablet, Rfl: 5 .  Multiple Vitamin (MULTIVITAMIN) capsule, Take 1 capsule by mouth daily., Disp: , Rfl:  .  traMADol (ULTRAM) 50 MG tablet, Take 1 tablet (50 mg total) by mouth every 8 (eight) hours as needed for severe pain., Disp: 15 tablet, Rfl: 0 .  traZODone (DESYREL) 100 MG tablet, Take 1 tablet (100 mg total) by mouth at bedtime., Disp: 30 tablet, Rfl: 5   Patient Care Team: Mar Daring, PA-C as PCP - General (Family Medicine)      Objective:   Vitals: BP (!) 158/88 (BP Location: Left Arm, Patient Position: Sitting, Cuff Size: Large)   Pulse 80   Temp 98.2 F (36.8 C) (Oral)   Resp 16   Ht 5' 4.5" (1.638 m)   Wt 255 lb 6.4 oz (115.8 kg)   SpO2 99%   BMI 43.16 kg/m     Vitals:   09/30/18 0832  BP: (!) 158/88  Pulse: 80  Resp: 16  Temp: 98.2 F (36.8 C)  TempSrc: Oral  SpO2: 99%  Weight: 255 lb 6.4 oz (115.8 kg)  Height: 5' 4.5" (1.638 m)     Physical Exam  Constitutional: She is oriented to person, place, and time. She appears well-developed and well-nourished.  Cardiovascular: Normal rate and regular rhythm.  Pulmonary/Chest: Effort normal and breath sounds normal. Right  breast exhibits no inverted nipple, no mass, no nipple discharge, no skin change and no tenderness. Left breast exhibits no inverted nipple, no mass, no nipple discharge, no skin change and no tenderness. No breast swelling, tenderness, discharge or bleeding. Breasts are symmetrical.  Abdominal: Soft. Bowel sounds are normal.  Genitourinary: Vagina normal and uterus normal. No labial fusion. There is no rash, tenderness, lesion or injury on the right labia. There is no rash, tenderness, lesion or injury on the left labia. Cervix exhibits no motion tenderness, no discharge and no friability. Right adnexum displays no mass, no tenderness and no fullness. Left adnexum displays no mass, no tenderness and no fullness.  Neurological: She is alert and oriented to person, place, and time.  Skin: Skin is warm and dry.  Psychiatric: She has a normal mood and affect. Her behavior is normal.     Depression Screen PHQ 2/9 Scores 09/30/2018 06/04/2018  PHQ - 2 Score 0 0  PHQ- 9 Score 0 5      Assessment & Plan:     Routine Health Maintenance and Physical Exam  Exercise Activities and Dietary recommendations Goals   None     Immunization History  Administered Date(s) Administered  . DTaP 11/25/2008  . Pneumococcal Polysaccharide-23 11/26/1999  . Td 11/25/1993    Health Maintenance  Topic Date Due  . Hepatitis C Screening  [birth year]  . HIV Screening  10/29/1973  . TETANUS/TDAP  11/26/2003  . MAMMOGRAM  11/09/2017  . PAP SMEAR  04/08/2018  . INFLUENZA VACCINE   06/25/2018  . FOOT EXAM  12/05/2018  . HEMOGLOBIN A1C  12/05/2018  . OPHTHALMOLOGY EXAM  01/06/2019  . COLONOSCOPY  11/27/2020  . PNEUMOCOCCAL POLYSACCHARIDE VACCINE AGE 39-64 HIGH RISK  Completed     Discussed health benefits of physical activity, and encouraged her to engage in regular exercise appropriate for her age and condition.    1. Annual physical exam  - CBC with Differential/Platelet - TSH  2. Cervical cancer screening  - Cytology - PAP  3. Screening for breast cancer  Counseled about self scheduling. Card provided.  - MM 3D SCREEN BREAST BILATERAL; Future  4. Screening for HIV (human immunodeficiency virus)  - HIV Antibody (routine testing w rflx)  5. Need for hepatitis C screening test  - Hepatitis C antibody  6. Type 2 diabetes mellitus without complication, without long-term current use of insulin (HCC)  A1c is up to 10.8% from 8.1% last check. Patient is extremely noncompliant with follow up and medications. Frequently cancels appointments and fails to follow up anywhere from several months to one year. Will stop taking medications without informing provider. Have informed her that ozempic can remain unrefrigerated and she should take it with her on all trips. Restart Ozempic at 0.25 mg weekly and then increase to 0.5 mg weekly.   - Comprehensive metabolic panel - Hemoglobin A1c - Lipid Panel With LDL/HDL Ratio - Amb ref to Medical Nutrition Therapy-MNT  7. Hypertension associated with diabetes ([place])  Uncontrolled, increase Lisinopril from 10 mg QD to 20 mg QD.   - lisinopril (PRINIVIL,ZESTRIL) 20 MG tablet; Take 1 tablet (20 mg total) by mouth daily.  Dispense: 90 tablet; Refill: 0  8. Hypercholesterolemia  Uncontrolled, persistently elevated transaminases. Refer to GI prior to starting statin.   9. Other polyneuropathy  Likely secondary to her uncontrolled diabetes. Counseled this will only get worse if sugars remain uncontrolled.    gabapentin (NEURONTIN) 300 MG capsule; Take 1 capsule (300 mg total)  by mouth at bedtime.  Dispense: 90 capsule; Refill: 0  10. Need for influenza vaccination  - Flu Vaccine QUAD 36+ mos IM  Return in about 3 months (around 12/31/2018) for DM HTN HLD .  The entirety of the information documented in the History of Present Illness, Review of Systems and Physical Exam were personally obtained by me. Portions of this information were initially documented by Lynford Humphrey, CMA and reviewed by me for thoroughness and accuracy.   --------------------------------------------------------------------    Trinna Post, PA-C  [facility]

## 2018-10-01 LAB — CBC WITH DIFFERENTIAL/PLATELET
Basophils Absolute: 0 10*3/uL (ref 0.0–0.2)
Basos: 1 %
EOS (ABSOLUTE): 0.1 10*3/uL (ref 0.0–0.4)
Eos: 2 %
Hematocrit: 40.8 % (ref 34.0–46.6)
Hemoglobin: 13.2 g/dL (ref 11.1–15.9)
Immature Grans (Abs): 0 10*3/uL (ref 0.0–0.1)
Immature Granulocytes: 0 %
Lymphocytes Absolute: 1.9 10*3/uL (ref 0.7–3.1)
Lymphs: 38 %
MCH: 26.9 pg (ref 26.6–33.0)
MCHC: 32.4 g/dL (ref 31.5–35.7)
MCV: 83 fL (ref 79–97)
Monocytes Absolute: 0.5 10*3/uL (ref 0.1–0.9)
Monocytes: 10 %
Neutrophils Absolute: 2.4 10*3/uL (ref 1.4–7.0)
Neutrophils: 49 %
Platelets: 237 10*3/uL (ref 150–450)
RBC: 4.9 x10E6/uL (ref 3.77–5.28)
RDW: 12.6 % (ref 12.3–15.4)
WBC: 4.9 10*3/uL (ref 3.4–10.8)

## 2018-10-01 LAB — COMPREHENSIVE METABOLIC PANEL
ALT: 56 IU/L — ABNORMAL HIGH (ref 0–32)
AST: 69 IU/L — ABNORMAL HIGH (ref 0–40)
Albumin/Globulin Ratio: 1.1 — ABNORMAL LOW (ref 1.2–2.2)
Albumin: 4.1 g/dL (ref 3.5–5.5)
Alkaline Phosphatase: 79 IU/L (ref 39–117)
BUN/Creatinine Ratio: 17 (ref 9–23)
BUN: 12 mg/dL (ref 6–24)
Bilirubin Total: 0.5 mg/dL (ref 0.0–1.2)
CO2: 22 mmol/L (ref 20–29)
Calcium: 9.4 mg/dL (ref 8.7–10.2)
Chloride: 98 mmol/L (ref 96–106)
Creatinine, Ser: 0.71 mg/dL (ref 0.57–1.00)
GFR calc Af Amer: 108 mL/min/{1.73_m2} (ref >59)
GFR calc non Af Amer: 94 mL/min/{1.73_m2} (ref >59)
Globulin, Total: 3.6 g/dL (ref 1.5–4.5)
Glucose: 289 mg/dL — ABNORMAL HIGH (ref 65–99)
Potassium: 3.7 mmol/L (ref 3.5–5.2)
Sodium: 136 mmol/L (ref 134–144)
Total Protein: 7.7 g/dL (ref 6.0–8.5)

## 2018-10-01 LAB — TSH: TSH: 1.16 u[IU]/mL (ref 0.450–4.500)

## 2018-10-01 LAB — LIPID PANEL WITH LDL/HDL RATIO
Cholesterol, Total: 206 mg/dL — ABNORMAL HIGH (ref 100–199)
HDL: 41 mg/dL (ref >39)
LDL Calculated: 131 mg/dL — ABNORMAL HIGH (ref 0–99)
LDl/HDL Ratio: 3.2 ratio (ref 0.0–3.2)
Triglycerides: 168 mg/dL — ABNORMAL HIGH (ref 0–149)
VLDL Cholesterol Cal: 34 mg/dL (ref 5–40)

## 2018-10-01 LAB — HEPATITIS C ANTIBODY: Hep C Virus Ab: <0.1 s/co ratio (ref 0.0–0.9)

## 2018-10-01 LAB — HEMOGLOBIN A1C
Est. average glucose Bld gHb Est-mCnc: 263 mg/dL
Hgb A1c MFr Bld: 10.8 % — ABNORMAL HIGH (ref 4.8–5.6)

## 2018-10-01 LAB — HIV ANTIBODY (ROUTINE TESTING W REFLEX): HIV Screen 4th Generation wRfx: NONREACTIVE

## 2018-10-02 ENCOUNTER — Telehealth: Payer: Self-pay

## 2018-10-02 DIAGNOSIS — R748 Abnormal levels of other serum enzymes: Secondary | ICD-10-CM

## 2018-10-02 LAB — CYTOLOGY - PAP
Diagnosis: NEGATIVE
HPV: NOT DETECTED

## 2018-10-02 NOTE — Telephone Encounter (Signed)
-----   Message from Trinna Post, [place] sent at 10/01/2018  3:50 PM EST ----- CBC normal. CMET shows elevated sugar and elevated liver enzymes. TSH normal, Hep C negative. A1C is 10.8% and is much worse than last time. She needs to take her Ozempic 0.25 mg once weekly and then increase to 0.5 mg once weekly after one month. Cholesterol not controlled. Needs to be on a cholesterol medication, but she has persistently elevated liver enzymes that need to be worked up. Recommend seeing GI.

## 2018-10-02 NOTE — Telephone Encounter (Signed)
Patient advised as below. Patient verbalizes understanding and is in agreement with treatment plan.  

## 2018-10-02 NOTE — Telephone Encounter (Signed)
[email or web address] 

## 2018-10-05 ENCOUNTER — Telehealth: Payer: Self-pay

## 2018-10-05 NOTE — Telephone Encounter (Signed)
[email or web address] 

## 2018-10-05 NOTE — Telephone Encounter (Signed)
-----   Message from Trinna Post, [place] sent at 10/05/2018  3:29 PM EST ----- PAP normal, HPV negative, repeat 5 years.

## 2018-10-06 NOTE — Telephone Encounter (Signed)
[email or web address]

## 2018-10-07 NOTE — Telephone Encounter (Signed)
[email or web address]

## 2018-10-12 ENCOUNTER — Encounter: Payer: Self-pay | Admitting: *Deleted

## 2018-10-29 NOTE — Progress Notes (Signed)
Patient: Julia Robertson Female    DOB: Mar 09, 1956   62 y.o.   MRN: [id number] Visit Date: 11/04/2018  Today's Provider: Trinna Post, PA-C   Chief Complaint  Patient presents with  . Diabetes  . Hyperlipidemia  . Hypertension   Subjective:    HPI  Diabetes Mellitus Type II, Follow-up:   Lab Results  Component Value Date   HGBA1C 11.1 (A) 10/30/2018   HGBA1C 10.8 (H) 09/30/2018   HGBA1C 8.1 (A) 06/04/2018   Last seen for diabetes 1 months ago.  Management since then includes: Restart Ozempic at 0.25 mg weekly and then increase to 0.5 mg weekly. She reports she is doing this without fail. She is asking if this makes her hair fall out. She has stopped jardiance previously on her own because she attributed hair loss to that medication.  She reports good compliance with treatment. She is having side effects. Patient states she is having hair loss. Current symptoms include none and have been stable. Home blood sugar records: patient states she does not remember the numbers.  Episodes of hypoglycemia? no   Current Insulin Regimen: none Most Recent Eye Exam:  Weight trend: stable Prior visit with dietician: no Current diet: poor. She says she "eats whatever I want." Current exercise: none  ------------------------------------------------------------------------   Hypertension, follow-up:  BP Readings from Last 3 Encounters:  11/02/18 (!) 162/96  10/30/18 (!) 184/112  09/30/18 (!) 158/88    She was last seen for hypertension 1 months ago.  BP at that visit was 184/112. Management since that visit includes none.She reports good compliance with treatment. She is having side effects. Patient states she is having hair loss. She is not exercising. She is not adherent to low salt diet.   Outside blood pressures are n/a. She is experiencing none.  Patient denies chest pain, chest pressure/discomfort, exertional chest pressure/discomfort, fatigue,  irregular heart beat, lower extremity edema, palpitations and tachypnea.   Cardiovascular risk factors include diabetes mellitus and hypertension.  Use of agents associated with hypertension: She is using sudafed for cold symptoms.  ------------------------------------------------------------------------    Lipid/Cholesterol, Follow-up:   Last seen for this 1 months ago.  Management since that visit includes none.  Last Lipid Panel:    Component Value Date/Time   CHOL 206 (H) 09/30/2018 0912   TRIG 168 (H) 09/30/2018 0912   HDL 41 09/30/2018 0912   CHOLHDL 4.8 (H) 12/05/2017 0936   LDLCALC 131 (H) 09/30/2018 0912    She reports good compliance with treatment.  She is currently on no medication for lipids. She has had unexplained elevated liver enzymes. Has been referred to GI previously but GI clinic was unable to make contact and thus letter was sent on 10/12/2018. She reports she just picked up the letter today from her mailbox.   Wt Readings from Last 3 Encounters:  11/02/18 251 lb 12.8 oz (114.2 kg)  10/30/18 255 lb 3.2 oz (115.8 kg)  09/30/18 255 lb 6.4 oz (115.8 kg)    ------------------------------------------------------------------------     No Known Allergies   Current Outpatient Medications:  .  BIOTIN PO, Take 1 tablet by mouth daily. , Disp: , Rfl:  .  gabapentin (NEURONTIN) 300 MG capsule, Take 1 capsule (300 mg total) by mouth at bedtime., Disp: 90 capsule, Rfl: 0 .  lisinopril (PRINIVIL,ZESTRIL) 20 MG tablet, Take 1 tablet (20 mg total) by mouth daily., Disp: 90 tablet, Rfl: 0 .  metFORMIN (GLUCOPHAGE) 1000 MG  tablet, Take 1 tablet (1,000 mg total) by mouth 2 (two) times daily with a meal., Disp: 60 tablet, Rfl: 5 .  Multiple Vitamin (MULTIVITAMIN) capsule, Take 1 capsule by mouth daily., Disp: , Rfl:  .  traMADol (ULTRAM) 50 MG tablet, Take 1 tablet (50 mg total) by mouth every 8 (eight) hours as needed for severe pain., Disp: 15 tablet, Rfl: 0 .   traZODone (DESYREL) 100 MG tablet, Take 1 tablet (100 mg total) by mouth at bedtime., Disp: 30 tablet, Rfl: 5 .  amLODipine (NORVASC) 5 MG tablet, Take 1 tablet (5 mg total) by mouth daily., Disp: 90 tablet, Rfl: 0 .  Semaglutide, 1 MG/DOSE, (OZEMPIC, 1 MG/DOSE,) 2 MG/1.5ML SOPN, Inject 1 mg into the skin once a week., Disp: 6 pen, Rfl: 0  Review of Systems  Constitutional: Negative.   HENT: Negative.   Respiratory: Negative.   Cardiovascular: Negative.   Neurological: Negative.     Social History   Tobacco Use  . Smoking status: Never Smoker  . Smokeless tobacco: Never Used  Substance Use Topics  . Alcohol use: No   Objective:   BP (!) 184/112 (BP Location: Left Arm, Patient Position: Sitting, Cuff Size: Normal)   Pulse 82   Temp 98.4 F (36.9 C) (Oral)   Wt 255 lb 3.2 oz (115.8 kg)   SpO2 99%   BMI 43.13 kg/m  Vitals:   10/30/18 0844  BP: (!) 184/112  Pulse: 82  Temp: 98.4 F (36.9 C)  TempSrc: Oral  SpO2: 99%  Weight: 255 lb 3.2 oz (115.8 kg)     Physical Exam  Constitutional: She is oriented to person, place, and time. She appears well-developed and well-nourished.  Cardiovascular: Normal rate and regular rhythm.  Pulmonary/Chest: Effort normal and breath sounds normal.  Neurological: She is alert and oriented to person, place, and time.  Skin: Skin is warm and dry.  Psychiatric: She has a normal mood and affect. Her behavior is normal.        Assessment & Plan:     1. Type 2 diabetes mellitus without complication, without long-term current use of insulin (HCC)  Uncontrolled, A1c worsening. Will increase ozempic. She is currently on maximum metformin therapy. She stopped jardiance because she reported it caused hair loss but she continues to report this today and I think it is unrelated to her diabetes medications and I have told her as much. May need to start insulin at follow up.   Have discussed her goals for the future with her. She reports she wants  to live a long life and enjoy her grandchildren. Have had frank discussion that her health status is not compatible with this goal She says she will attend the nutrition follow up.   - POCT glycosylated hemoglobin (Hb A1C) - Ambulatory referral to diabetic education - Semaglutide, 1 MG/DOSE, (OZEMPIC, 1 MG/DOSE,) 2 MG/1.5ML SOPN; Inject 1 mg into the skin once a week.  Dispense: 6 pen; Refill: 0  2. Hypertension associated with diabetes ([place])  Uncontrolled today. She reports she knows which decongestant to use and that it is corcidin. Says it does not work for her and that is why she is using sudafed. Do think her BP is uncontrolled even without sudafed, will add amlodipine as below and follow up 1-2 weeks.  - amLODipine (NORVASC) 5 MG tablet; Take 1 tablet (5 mg total) by mouth daily.  Dispense: 90 tablet; Refill: 0  3. Hypercholesterolemia  Elevated liver enzymes, do not want to start  statin. She reports she will follow up with GI.  4. Morbid obesity ([place])  She has lost five Robertson since last visit. Continue to counsel on appropriate dietary interventions.  Return in about 1 week (around 11/06/2018) for HTN.  The entirety of the information documented in the History of Present Illness, Review of Systems and Physical Exam were personally obtained by me. Portions of this information were initially documented by Lyndel Pleasure, CMA and reviewed by me for thoroughness and accuracy.             Trinna Post, PA-C  [facility]

## 2018-10-30 ENCOUNTER — Ambulatory Visit: Payer: BLUE CROSS/BLUE SHIELD | Admitting: Physician Assistant

## 2018-10-30 ENCOUNTER — Encounter: Payer: Self-pay | Admitting: Physician Assistant

## 2018-10-30 ENCOUNTER — Ambulatory Visit: Payer: Self-pay | Admitting: Physician Assistant

## 2018-10-30 VITALS — BP 184/112 | HR 82 | Temp 98.4°F | Wt 255.2 lb

## 2018-10-30 DIAGNOSIS — I152 Hypertension secondary to endocrine disorders: Secondary | ICD-10-CM

## 2018-10-30 DIAGNOSIS — E1159 Type 2 diabetes mellitus with other circulatory complications: Secondary | ICD-10-CM

## 2018-10-30 DIAGNOSIS — E119 Type 2 diabetes mellitus without complications: Secondary | ICD-10-CM | POA: Diagnosis not present

## 2018-10-30 DIAGNOSIS — E7800 Pure hypercholesterolemia, unspecified: Secondary | ICD-10-CM

## 2018-10-30 DIAGNOSIS — I10 Essential (primary) hypertension: Secondary | ICD-10-CM

## 2018-10-30 LAB — POCT GLYCOSYLATED HEMOGLOBIN (HGB A1C)
Est. average glucose Bld gHb Est-mCnc: 272
Hemoglobin A1C: 11.1 % — AB (ref 4.0–5.6)

## 2018-10-30 MED ORDER — SEMAGLUTIDE (1 MG/DOSE) 2 MG/1.5ML ~~LOC~~ SOPN: 1.0000 mg | PEN_INJECTOR | SUBCUTANEOUS | 0 refills | Status: DC

## 2018-10-30 MED ORDER — AMLODIPINE BESYLATE 5 MG PO TABS: 5.0000 mg | ORAL_TABLET | Freq: Every day | ORAL | 0 refills | Status: DC

## 2018-11-02 ENCOUNTER — Encounter: Payer: BLUE CROSS/BLUE SHIELD | Attending: Physician Assistant | Admitting: Dietician

## 2018-11-02 ENCOUNTER — Encounter: Payer: Self-pay | Admitting: Dietician

## 2018-11-02 VITALS — BP 162/96 | Ht 64.0 in | Wt 251.8 lb

## 2018-11-02 DIAGNOSIS — E119 Type 2 diabetes mellitus without complications: Secondary | ICD-10-CM | POA: Diagnosis not present

## 2018-11-02 DIAGNOSIS — E1140 Type 2 diabetes mellitus with diabetic neuropathy, unspecified: Secondary | ICD-10-CM

## 2018-11-02 NOTE — Patient Instructions (Addendum)
  Check blood sugars 2 x day before breakfast and 2 hrs after supper every day and record  Bring blood sugar records to the next appointment/class  Call your doctor for a prescription for:  1. Meter strips (type):  Contour Next test strips  checking  2 times per day  2. Lancets (type): Microlet lancets  checking  2   times per day  Exercise:   Do elliptical or water exercises 15-20 min 2 days/wk  Eat 3 meals day,   2  snacks a day in afternoon and at bedtime  Space meals 4-6 hours apart  Eat 2-3 carbohydrate servings/meal + protein  Eat 1 carbohydrate serving/snack + protein  Avoid sugar sweetened drinks (soda, tea, coffee, sports drinks, juices)  Drink plenty of water  Make a dentist appointment  Get a Sharps container  Return for appointment/classes on:  11-30-18

## 2018-11-02 NOTE — Progress Notes (Signed)
Diabetes Self-Management Education  Visit Type: First/Initial  Appt. Start Time: 1530 Appt. End Time: 1645  11/02/2018  [name], identified by name and date of birth, is a 62 y.o. female with a diagnosis of Diabetes: Type 2.   ASSESSMENT  Blood pressure (!) 162/96, height 5\' 4"  (1.626 m), weight 251 lb 12.8 oz (114.2 kg). Body mass index is 43.22 kg/m.  Diabetes Self-Management Education - 11/02/18 1752      Visit Information   Visit Type  First/Initial      Initial Visit   Diabetes Type  Type 2      Health Coping   How would you rate your overall health?  Fair      Psychosocial Assessment   Patient Belief/Attitude about Diabetes  Motivated to manage diabetes    Self-care barriers  None    Self-management support  Family;Doctor's office    Other persons present  Patient    Patient Concerns  Medication;Healthy Lifestyle;Weight Control;Glycemic Control   prevent complications   Special Needs  None    Preferred Learning Style  Visual;Auditory    Learning Readiness  Ready    What is the last grade level you completed in school?  some college      Pre-Education Assessment   Patient understands the diabetes disease and treatment process.  Needs Review    Patient understands incorporating nutritional management into lifestyle.  Needs Review    Patient undertands incorporating physical activity into lifestyle.  Needs Instruction    Patient understands using medications safely.  Needs Review    Patient understands monitoring blood glucose, interpreting and using results  Needs Instruction    Patient understands prevention, detection, and treatment of acute complications.  Needs Instruction    Patient understands prevention, detection, and treatment of chronic complications.  Needs Review    Patient understands how to develop strategies to address psychosocial issues.  Needs Instruction    Patient understands how to develop strategies to promote health/change  behavior.  Needs Instruction      Complications   Last HgB A1C per patient/outside source  11.1 %   10-30-18   How often do you check your blood sugar?  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  Yes   04-2018   Have you had a dental exam in the past 12 months?  Yes   11-2017   Are you checking your feet?  Yes    How many days per week are you checking your feet?  7      Dietary Intake   Breakfast  eats breakfast at 9:30a (cold flakes and 2% milk)    Snack (morning)  none    Lunch  eats sandwich at 12p    Snack (afternoon)  none    Dinner  eats supper at 6p-6:30p=eats meat and 2 vegetables    Snack (evening)  none    Beverage(s)  drinks water 8+x/day and milk 1x/day      Exercise   Exercise Type  ADL's      Patient Education   Previous Diabetes Education  No    Disease state   Definition of diabetes, type 1 and 2, and the diagnosis of diabetes;Explored patient's options for treatment of their diabetes    Nutrition management   Role of diet in  the treatment of diabetes and the relationship between the three main macronutrients and blood glucose level;Food label reading, portion sizes and measuring food.;Carbohydrate counting    Physical activity and exercise   Role of exercise on diabetes management, blood pressure control and cardiac health.;Helped patient identify appropriate exercises in relation to his/her diabetes, diabetes complications and other health issue.    Medications  Reviewed patients medication for diabetes, action, purpose, timing of dose and side effects.    Monitoring  Taught/evaluated SMBG meter.;Purpose and frequency of SMBG.;Taught/discussed recording of test results and interpretation of SMBG.;Identified appropriate SMBG and/or A1C goals.;Yearly dilated eye exam    Chronic complications  Relationship between chronic complications and blood  glucose control;Dental care;Nephropathy, what it is, prevention of, the use of ACE, ARB's and early detection of through urine microalbumia.;Retinopathy and reason for yearly dilated eye exams;Reviewed with patient heart disease, higher risk of, and prevention;Lipid levels, blood glucose control and heart disease    Personal strategies to promote health  Lifestyle issues that need to be addressed for better diabetes care;Helped patient develop diabetes management plan for (enter comment)      Outcomes   Expected Outcomes  Demonstrated interest in learning. Expect positive outcomes       Individualized Plan for Diabetes Self-Management Training:   Learning Objective:  Patient will have a greater understanding of diabetes self-management. Patient education plan is to attend individual and/or group sessions per assessed needs and concerns.   Plan:   Patient Instructions   Check blood sugars 2 x day before breakfast and 2 hrs after supper every day and record  Bring blood sugar records to the next appointment/class  Call your doctor for a prescription for:  1. Meter strips (type):  Contour Next test strips  checking  2 times per day  2. Lancets (type): Microlet lancets  checking  2   times per day  Exercise:   Do elliptical or water exercises 15-20 min 2 days/wk  Eat 3 meals day,   2  snacks a day in afternoon and at bedtime  Space meals 4-6 hours apart  Eat 2-3 carbohydrate servings/meal + protein  Eat 1 carbohydrate serving/snack + protein  Avoid sugar sweetened drinks (soda, tea, coffee, sports drinks, juices)  Drink plenty of water  Make a dentist appointment  Get a Sharps container  Return for appointment/classes on:  11-30-18   Expected Outcomes:  Demonstrated interest in learning. Expect positive outcomes  Education material provided: General meal planning guidelines, Food Group  Handout, Contour Next meter  If problems or questions, patient to contact team via:  [phone]  Future DSME appointment:  11-30-18

## 2018-11-04 NOTE — Patient Instructions (Signed)

## 2018-11-05 ENCOUNTER — Ambulatory Visit
Admission: RE | Admit: 2018-11-05 | Discharge: 2018-11-05 | Disposition: A | Discharge status: Home / Self Care | Payer: BLUE CROSS/BLUE SHIELD | Source: Ambulatory Visit | Attending: Physician Assistant | Admitting: Physician Assistant

## 2018-11-05 DIAGNOSIS — Z1239 Encounter for other screening for malignant neoplasm of breast: Secondary | ICD-10-CM

## 2018-11-05 DIAGNOSIS — Z1231 Encounter for screening mammogram for malignant neoplasm of breast: Secondary | ICD-10-CM | POA: Diagnosis not present

## 2018-11-06 ENCOUNTER — Ambulatory Visit: Payer: BLUE CROSS/BLUE SHIELD | Admitting: Physician Assistant

## 2018-11-06 ENCOUNTER — Ambulatory Visit: Payer: Self-pay

## 2018-11-06 ENCOUNTER — Encounter: Payer: Self-pay | Admitting: Physician Assistant

## 2018-11-06 VITALS — BP 143/83 | HR 79 | Temp 98.3°F | Resp 16 | Wt 250.0 lb

## 2018-11-06 DIAGNOSIS — R748 Abnormal levels of other serum enzymes: Secondary | ICD-10-CM

## 2018-11-06 DIAGNOSIS — I152 Hypertension secondary to endocrine disorders: Secondary | ICD-10-CM

## 2018-11-06 DIAGNOSIS — E1159 Type 2 diabetes mellitus with other circulatory complications: Secondary | ICD-10-CM | POA: Diagnosis not present

## 2018-11-06 DIAGNOSIS — E7800 Pure hypercholesterolemia, unspecified: Secondary | ICD-10-CM

## 2018-11-06 DIAGNOSIS — I10 Essential (primary) hypertension: Secondary | ICD-10-CM

## 2018-11-06 DIAGNOSIS — E119 Type 2 diabetes mellitus without complications: Secondary | ICD-10-CM

## 2018-11-06 MED ORDER — AMLODIPINE BESYLATE 10 MG PO TABS: 10.0000 mg | ORAL_TABLET | Freq: Every day | ORAL | 0 refills | Status: DC

## 2018-11-06 NOTE — Patient Instructions (Signed)

## 2018-11-06 NOTE — Progress Notes (Signed)
Patient: Julia Robertson Female    DOB: 1955/12/03   62 y.o.   MRN: [id number] Visit Date: 11/06/2018  Today's Provider: Trinna Post, PA-C   Chief Complaint  Patient presents with  . Follow-up    HTN   Subjective:     HPI  Hypertension, follow-up:  BP Readings from Last 3 Encounters:  11/06/18 (!) 143/83  11/02/18 (!) 162/96  10/30/18 (!) 184/112    She was last seen for hypertension 1 week ago. BP at that visit was 162/96 Management since that visit includes Amlodipine 5 mg added to Lisinopril 20 mg daily. She was taking Sudafed and has since stopped.  She reports excellent compliance with treatment. She is not having side effects.  She is not exercising. She is adherent to low salt diet.   Outside blood pressures are 116/98-.180/104 She is experiencing ankle swelling. Patient denies chest pain, chest pressure/discomfort, exertional chest pressure/discomfort, fatigue, irregular heart beat, near-syncope and palpitations.   Cardiovascular risk factors include diabetes mellitus, hypertension and obesity (BMI >= 30 kg/m2).     Weight trend: stable Wt Readings from Last 3 Encounters:  11/06/18 250 lb (113.4 kg)  11/02/18 251 lb 12.8 oz (114.2 kg)  10/30/18 255 lb 3.2 oz (115.8 kg)    Current diet: in general, a "healthy" diet    Diabetes Mellitus: Continues with metformin and ozempic. She has just attended nutrition class - learned what foods she should eat and at what times. Learned that she should not be eating starches.  Mammogram: Underwent mammography yesterday, this was normal.   Elevated Liver Enzymes: Persist. She does not use alcohol or tylenol.  ------------------------------------------------------------------------  No Known Allergies   Current Outpatient Medications:  .  amLODipine (NORVASC) 5 MG tablet, Take 1 tablet (5 mg total) by mouth daily., Disp: 90 tablet, Rfl: 0 .  BIOTIN PO, Take 1 tablet by mouth daily. , Disp: , Rfl:    .  gabapentin (NEURONTIN) 300 MG capsule, Take 1 capsule (300 mg total) by mouth at bedtime., Disp: 90 capsule, Rfl: 0 .  lisinopril (PRINIVIL,ZESTRIL) 20 MG tablet, Take 1 tablet (20 mg total) by mouth daily., Disp: 90 tablet, Rfl: 0 .  metFORMIN (GLUCOPHAGE) 1000 MG tablet, Take 1 tablet (1,000 mg total) by mouth 2 (two) times daily with a meal., Disp: 60 tablet, Rfl: 5 .  Multiple Vitamin (MULTIVITAMIN) capsule, Take 1 capsule by mouth daily., Disp: , Rfl:  .  Semaglutide, 1 MG/DOSE, (OZEMPIC, 1 MG/DOSE,) 2 MG/1.5ML SOPN, Inject 1 mg into the skin once a week., Disp: 6 pen, Rfl: 0 .  traMADol (ULTRAM) 50 MG tablet, Take 1 tablet (50 mg total) by mouth every 8 (eight) hours as needed for severe pain., Disp: 15 tablet, Rfl: 0 .  traZODone (DESYREL) 100 MG tablet, Take 1 tablet (100 mg total) by mouth at bedtime., Disp: 30 tablet, Rfl: 5  Review of Systems  Social History   Tobacco Use  . Smoking status: Never Smoker  . Smokeless tobacco: Never Used  Substance Use Topics  . Alcohol use: No      Objective:   BP (!) 143/83 (BP Location: Left Arm, Patient Position: Sitting, Cuff Size: Large)   Pulse 79   Temp 98.3 F (36.8 C) (Oral)   Resp 16   Wt 250 lb (113.4 kg)   BMI 42.91 kg/m  Vitals:   11/06/18 0815  BP: (!) 143/83  Pulse: 79  Resp: 16  Temp: 98.3  F (36.8 C)  TempSrc: Oral  Weight: 250 lb (113.4 kg)     Physical Exam Constitutional:      Appearance: Normal appearance. She is obese.  Cardiovascular:     Rate and Rhythm: Normal rate and regular rhythm.     Heart sounds: Normal heart sounds. No murmur.  Pulmonary:     Effort: Pulmonary effort is normal.     Breath sounds: Normal breath sounds.  Neurological:     Mental Status: She is alert and oriented to person, place, and time. Mental status is at baseline.  Psychiatric:        Mood and Affect: Mood normal.        Behavior: Behavior normal.         Assessment & Plan    1. Type 2 diabetes mellitus  without complication, without long-term current use of insulin ([place])  Has attended nutrition classes and reports learning a good deal about how to eat for diabetes. Will refer her to chronic care management for more in depth training on managing her diabetes as last A1c was 11%.   - Ambulatory referral to Chronic Care Management Services - amLODipine (NORVASC) 10 MG tablet; Take 1 tablet (10 mg total) by mouth daily.  Dispense: 90 tablet; Refill: 0  2. Hypertension associated with diabetes ([place]) Improved but not controlled, will increase amlodipine as below.  - amLODipine (NORVASC) 10 MG tablet; Take 1 tablet (10 mg total) by mouth daily.  Dispense: 90 tablet; Refill: 0  3. Elevated liver enzymes  Will check as below, suspect fatty liver. Would really like to start her on a cholesterol medication pending workup.  - Comprehensive Metabolic Panel (CMET) - Hepatitis B Surface AntiBODY - Hepatitis B Surface AntiGEN - Hepatitis B Core Antibody, total - Hepatitis C antibody - Fe+TIBC+Fer  Return in about 1 month (around 12/07/2018) for HTN, DM, HLD .     The entirety of the information documented in the History of Present Illness, Review of Systems and Physical Exam were personally obtained by me. Portions of this information were initially documented by Lyndel Pleasure, CMA and reviewed by me for thoroughness and accuracy.       Trinna Post, PA-C  [facility]

## 2018-11-06 NOTE — Chronic Care Management (AMB) (Signed)
  Care Management Note    Ms Matisha Termine. Sharece Fleischhacker is a 62 y.o. year old female referred to Chronic Care Management by Carles Collet for chronic case management specifically for DM education related to patients most recent A1C 11.1. Chronic conditions include HTN, DM2, Hyperchoesterolemia, and morbid obesity . Last office visit with Trinna Post, PA-C was 11/06/18.   Was unable to reach patient via telephone today for introduction of services provided by the CCM Team. I have left HIPAA compliant voicemail asking patient to return my call. (unsuccessful outreach #1).  Plan: Will follow-up within 3-5  business days via telephone.   Tiaira Arambula E. Rollene Rotunda, RN, BSN Nurse Care Coordinator [facility] Care Management [phone]

## 2018-11-07 LAB — COMPREHENSIVE METABOLIC PANEL
ALT: 50 IU/L — ABNORMAL HIGH (ref 0–32)
AST: 49 IU/L — ABNORMAL HIGH (ref 0–40)
Albumin/Globulin Ratio: 1.3 (ref 1.2–2.2)
Albumin: 4.2 g/dL (ref 3.6–4.8)
Alkaline Phosphatase: 78 IU/L (ref 39–117)
BUN/Creatinine Ratio: 11 — ABNORMAL LOW (ref 12–28)
BUN: 8 mg/dL (ref 8–27)
Bilirubin Total: 0.5 mg/dL (ref 0.0–1.2)
CO2: 22 mmol/L (ref 20–29)
Calcium: 9.4 mg/dL (ref 8.7–10.3)
Chloride: 102 mmol/L (ref 96–106)
Creatinine, Ser: 0.7 mg/dL (ref 0.57–1.00)
GFR calc Af Amer: 109 mL/min/{1.73_m2} (ref >59)
GFR calc non Af Amer: 94 mL/min/{1.73_m2} (ref >59)
Globulin, Total: 3.3 g/dL (ref 1.5–4.5)
Glucose: 155 mg/dL — ABNORMAL HIGH (ref 65–99)
Potassium: 3.8 mmol/L (ref 3.5–5.2)
Sodium: 138 mmol/L (ref 134–144)
Total Protein: 7.5 g/dL (ref 6.0–8.5)

## 2018-11-07 LAB — IRON,TIBC AND FERRITIN PANEL
Ferritin: 408 ng/mL — ABNORMAL HIGH (ref 15–150)
Iron Saturation: 29 % (ref 15–55)
Iron: 89 ug/dL (ref 27–159)
Total Iron Binding Capacity: 306 ug/dL (ref 250–450)
UIBC: 217 ug/dL (ref 131–425)

## 2018-11-07 LAB — HEPATITIS B SURFACE ANTIBODY,QUALITATIVE: Hep B Surface Ab, Qual: NONREACTIVE

## 2018-11-07 LAB — HEPATITIS B SURFACE ANTIGEN: Hepatitis B Surface Ag: NEGATIVE

## 2018-11-07 LAB — HEPATITIS B CORE ANTIBODY, TOTAL: Hep B Core Total Ab: NEGATIVE

## 2018-11-07 LAB — HEPATITIS C ANTIBODY: Hep C Virus Ab: <0.1 s/co ratio (ref 0.0–0.9)

## 2018-11-11 ENCOUNTER — Ambulatory Visit: Payer: Self-pay

## 2018-11-11 DIAGNOSIS — I10 Essential (primary) hypertension: Secondary | ICD-10-CM

## 2018-11-11 DIAGNOSIS — E1159 Type 2 diabetes mellitus with other circulatory complications: Secondary | ICD-10-CM

## 2018-11-11 DIAGNOSIS — I152 Hypertension secondary to endocrine disorders: Secondary | ICD-10-CM

## 2018-11-11 DIAGNOSIS — E119 Type 2 diabetes mellitus without complications: Secondary | ICD-10-CM

## 2018-11-11 NOTE — Chronic Care Management (AMB) (Signed)
   Care Management   Note  11/11/2018 Name: Leoma Folds MRN: [id number] DOB: 15-Jan-1956  Ms. Anvika Gashi Janeese Mcgloin is a 62 y.o. year old female who sees Fenton Malling PA-C for primary care. Carles Collet asked the CCM team to consult the patient for assistance with chronic disease management related to DM and patients most recent A1C of 11.1. Referral was placed 11/06/18. Telephone outreach to patient today to introduce CCM services.   [name] agreed that CCM services would be helpful to them.   Ms. Shailah Gibbins was given information about Care Management services today including:  1. Case Management services includes personalized support from designated clinical staff supervised by her physician, including individualized plan of care and coordination with other care providers 2. 24/7 contact phone numbers for assistance for urgent and routine care needs. 3. The patient may stop case management services at any time by phone call to the office staff.  Patient agreed to services and verbal consent obtained. Patient was provided with CCM RN CM and Clinic Pharmacist contact information.  Plan: CCM Team will meet with [name] for her initial face to face visit on 11/23/18 at 3:30  Danity Schmelzer E. Rollene Rotunda, RN, BSN Nurse Care Coordinator [facility] Care Management [phone]

## 2018-11-12 ENCOUNTER — Ambulatory Visit (INDEPENDENT_AMBULATORY_CARE_PROVIDER_SITE_OTHER): Payer: BLUE CROSS/BLUE SHIELD | Admitting: Physician Assistant

## 2018-11-12 ENCOUNTER — Encounter: Payer: Self-pay | Admitting: Physician Assistant

## 2018-11-12 VITALS — BP 167/85 | HR 66 | Temp 97.7°F | Resp 16 | Wt 247.6 lb

## 2018-11-12 DIAGNOSIS — T148XXA Other injury of unspecified body region, initial encounter: Secondary | ICD-10-CM | POA: Diagnosis not present

## 2018-11-12 MED ORDER — CYCLOBENZAPRINE HCL 10 MG PO TABS: 10.0000 mg | ORAL_TABLET | Freq: Two times a day (BID) | ORAL | 0 refills | Status: DC | PRN

## 2018-11-12 MED ORDER — MELOXICAM 7.5 MG PO TABS: 7.5000 mg | ORAL_TABLET | Freq: Every day | ORAL | 0 refills | Status: DC

## 2018-11-12 NOTE — Progress Notes (Signed)
Patient: Julia Robertson Female    DOB: Aug 28, 1956   62 y.o.   MRN: [id number] Visit Date: 11/13/2018  Today's Provider: Trinna Post, PA-C   Chief Complaint  Patient presents with  . Neck Pain   Subjective:    Neck Pain   This is a new problem. The current episode started in the past 7 days (started Tuesday evening). The problem has been gradually worsening. The pain is associated with nothing. The pain is present in the left side. The quality of the pain is described as aching. Exacerbated by: with movement. The pain is same all the time. Stiffness is present all day. Pertinent negatives include no chest pain, headaches, numbness, pain with swallowing, tingling, trouble swallowing or visual change. Treatments tried: Tramadol. The treatment provided no relief.   BP Readings from Last 3 Encounters:  11/12/18 (!) 167/85  11/06/18 (!) 143/83  11/02/18 (!) 162/96   Wt Readings from Last 3 Encounters:  11/12/18 247 lb 9.6 oz (112.3 kg)  11/06/18 250 lb (113.4 kg)  11/02/18 251 lb 12.8 oz (114.2 kg)      No Known Allergies   Current Outpatient Medications:  .  amLODipine (NORVASC) 10 MG tablet, Take 1 tablet (10 mg total) by mouth daily., Disp: 90 tablet, Rfl: 0 .  BIOTIN PO, Take 1 tablet by mouth daily. , Disp: , Rfl:  .  gabapentin (NEURONTIN) 300 MG capsule, Take 1 capsule (300 mg total) by mouth at bedtime., Disp: 90 capsule, Rfl: 0 .  lisinopril (PRINIVIL,ZESTRIL) 20 MG tablet, Take 1 tablet (20 mg total) by mouth daily., Disp: 90 tablet, Rfl: 0 .  metFORMIN (GLUCOPHAGE) 1000 MG tablet, Take 1 tablet (1,000 mg total) by mouth 2 (two) times daily with a meal., Disp: 60 tablet, Rfl: 5 .  Multiple Vitamin (MULTIVITAMIN) capsule, Take 1 capsule by mouth daily., Disp: , Rfl:  .  Semaglutide, 1 MG/DOSE, (OZEMPIC, 1 MG/DOSE,) 2 MG/1.5ML SOPN, Inject 1 mg into the skin once a week., Disp: 6 pen, Rfl: 0 .  traMADol (ULTRAM) 50 MG tablet, Take 1 tablet (50 mg total)  by mouth every 8 (eight) hours as needed for severe pain., Disp: 15 tablet, Rfl: 0 .  traZODone (DESYREL) 100 MG tablet, Take 1 tablet (100 mg total) by mouth at bedtime., Disp: 30 tablet, Rfl: 5 .  cyclobenzaprine (FLEXERIL) 10 MG tablet, Take 1 tablet (10 mg total) by mouth 2 (two) times daily as needed for muscle spasms., Disp: 30 tablet, Rfl: 0 .  meloxicam (MOBIC) 7.5 MG tablet, Take 1 tablet (7.5 mg total) by mouth daily., Disp: 30 tablet, Rfl: 0  Review of Systems  Constitutional: Negative.   HENT: Negative for trouble swallowing.   Cardiovascular: Negative.  Negative for chest pain.  Musculoskeletal: Positive for neck pain.  Neurological: Negative.  Negative for tingling, numbness and headaches.    Social History   Tobacco Use  . Smoking status: Never Smoker  . Smokeless tobacco: Never Used  Substance Use Topics  . Alcohol use: No      Objective:   BP (!) 167/85 (BP Location: Right Arm, Patient Position: Sitting, Cuff Size: Large)   Pulse 66   Temp 97.7 F (36.5 C) (Oral)   Resp 16   Wt 247 lb 9.6 oz (112.3 kg)   BMI 42.50 kg/m  Vitals:   11/12/18 0954  BP: (!) 167/85  Pulse: 66  Resp: 16  Temp: 97.7 F (36.5 C)  TempSrc:  Oral  Weight: 247 lb 9.6 oz (112.3 kg)     Physical Exam Constitutional:      Appearance: Normal appearance. She is obese.  Neck:     Musculoskeletal: Decreased range of motion. Muscular tenderness present. No neck rigidity.   Musculoskeletal:        General: No swelling, deformity or signs of injury.  Skin:    General: Skin is warm and dry.  Neurological:     Mental Status: She is oriented to person, place, and time. Mental status is at baseline.  Psychiatric:        Mood and Affect: Mood normal.        Behavior: Behavior normal.         Assessment & Plan    1. Muscle strain  Counseled she may use muscle relaxer and anti-inflammatories as below. She should not use tramadol for this issue.   - cyclobenzaprine (FLEXERIL) 10  MG tablet; Take 1 tablet (10 mg total) by mouth 2 (two) times daily as needed for muscle spasms.  Dispense: 30 tablet; Refill: 0 - meloxicam (MOBIC) 7.5 MG tablet; Take 1 tablet (7.5 mg total) by mouth daily.  Dispense: 30 tablet; Refill: 0  Return if symptoms worsen or fail to improve.  The entirety of the information documented in the History of Present Illness, Review of Systems and Physical Exam were personally obtained by me. Portions of this information were initially documented by Hurman Horn, CMA and reviewed by me for thoroughness and accuracy.         Trinna Post, PA-C  [facility]

## 2018-11-12 NOTE — Patient Instructions (Signed)
Muscle Strain A muscle strain is an injury that happens when a muscle is stretched longer than normal. This can happen during a fall, sports, or lifting. This can tear some muscle fibers. Usually, recovery from muscle strain takes 1-2 weeks. Complete healing normally takes 5-6 weeks. This condition is first treated with PRICE therapy. This involves:  Protecting your muscle from being injured again.  Resting your injured muscle.  Icing your injured muscle.  Applying pressure (compression) to your injured muscle. This may be done with a splint or elastic bandage.  Raising (elevating) your injured muscle. Your doctor may also recommend medicine for pain. Follow these instructions at home: If you have a splint:  Wear the splint as told by your doctor. Take it off only as told by your doctor.  Loosen the splint if your fingers or toes tingle, get numb, or turn cold and blue.  Keep the splint clean.  If the splint is not waterproof: ? Do not let it get wet. ? Cover it with a watertight covering when you take a bath or a shower. Managing pain, stiffness, and swelling   If directed, put ice on your injured area. ? If you have a removable splint, take it off as told by your doctor. ? Put ice in a plastic bag. ? Place a towel between your skin and the bag. ? Leave the ice on for 20 minutes, 2-3 times a day.  Move your fingers or toes often. This helps to avoid stiffness and lessen swelling.  Raise your injured area above the level of your heart while you are sitting or lying down.  Wear an elastic bandage as told by your doctor. Make sure it is not too tight. General instructions  Take over-the-counter and prescription medicines only as told by your doctor.  Limit your activity. Rest your injured muscle as told by your doctor. Your doctor may say that gentle movements are okay.  If physical therapy was prescribed, do exercises as told by your doctor.  Do not put pressure on any  part of the splint until it is fully hardened. This may take many hours.  Do not use any products that contain nicotine or tobacco, such as cigarettes and e-cigarettes. These can delay bone healing. If you need help quitting, ask your doctor.  Warm up before you exercise. This helps to prevent more muscle strains.  Ask your doctor when it is safe to drive if you have a splint.  Keep all follow-up visits as told by your doctor. This is important. Contact a doctor if:  You have more pain or swelling in your injured area. Get help right away if:  You have any of these problems in your injured area: ? You have numbness. ? You have tingling. ? You lose a lot of strength. Summary  A muscle strain is an injury that happens when a muscle is stretched longer than normal.  This condition is first treated with PRICE therapy. This includes protecting, resting, icing, adding pressure, and raising your injury.  Limit your activity. Rest your injured muscle as told by your doctor. Your doctor may say that gentle movements are okay.  Warm up before you exercise. This helps to prevent more muscle strains. This information is not intended to replace advice given to you by your health care provider. Make sure you discuss any questions you have with your health care provider. Document Released: 08/20/2008 Document Revised: 12/18/2016 Document Reviewed: 12/18/2016 Elsevier Interactive Patient Education  2019 [employer]  

## 2018-11-23 ENCOUNTER — Ambulatory Visit: Payer: BLUE CROSS/BLUE SHIELD

## 2018-11-23 ENCOUNTER — Other Ambulatory Visit: Payer: Self-pay | Admitting: Physician Assistant

## 2018-11-23 DIAGNOSIS — E119 Type 2 diabetes mellitus without complications: Secondary | ICD-10-CM

## 2018-11-23 DIAGNOSIS — E1159 Type 2 diabetes mellitus with other circulatory complications: Secondary | ICD-10-CM

## 2018-11-23 DIAGNOSIS — I152 Hypertension secondary to endocrine disorders: Secondary | ICD-10-CM

## 2018-11-23 DIAGNOSIS — I10 Essential (primary) hypertension: Secondary | ICD-10-CM

## 2018-11-23 MED ORDER — GLUCOSE BLOOD VI STRP: ORAL_STRIP | 12 refills | Status: DC

## 2018-11-23 NOTE — Chronic Care Management (AMB) (Signed)
Care Management   Initial Visit Note  11/23/2018 Name: Julia Robertson MRN: [id number] DOB: 03/01/1956  Referred by: Mar Daring, PA-C Reason for referral : No chief complaint on file.   Subjective: "I really need to learn how to use my glucometer" "I was doing good with my diabetes until I just decided to eat whatever I wanted to when I went back to work"  Objective:  Lab Results  Component Value Date   HGBA1C 11.1 (A) 10/30/2018   BP Readings from Last 3 Encounters:  11/12/18 (!) 167/85  11/06/18 (!) 143/83  11/02/18 (!) 162/96    Assessment: [name] is a 62 y.o.year old female who sees Carles Collet PA-Cfor primary care. Carles Collet asked the CCM team to consult the patient for assistance with chronic disease management related toDM and patients most recent A1C of 11.1. Referral was placed12/13/19. Today [name] met with the CCM Team to establish health goals related to her DM.  Goals Addressed            This Visit's Progress   . "I need to learn how to use my glucose meter" (pt-stated)       [name] was diagnosed with diabetes approximately 3 years ago. She attended diabetes education at [facility] and did well keeping her A1C down below 7 until last year when she decided to eat what she "wanted to" and stop monitoring her blood sugars. It was discussed today that [name] works at a [employer] facility and checks her blood sugars at work intermittantly however none of her readings are fasting. She recently purchased a new meter from [place] but she has been unsure of how to use. There were only three readings noted today the first mid December.   Nurse Case Manager Clinical Goal(s): Over the next 14 days, patient will verbalize independence with checking blood glucose levels with her meter  Interventions: Demonstrated use of patients personal glucometer and assessed patient's ability to return demonstrate.  Reviewed importance of daily monitoring (morning fasting and 2 hours after her evening meal). Provided patient with daily CBG log for recording numbers.  Patient Self Care Activities:  1. Independently checks blood glucose levels and records 2. Monitor daily intake of carbohydrates      . "I want to do better with my diabetes" (pt-stated)       [name] was diagnosed with diabetes approximately 3 years ago. She attended diabetes education at [facility] and did well keeping her A1C down below 7 until last year when she decided to eat what she "wanted to" and stop monitoring her blood sugars. It was discussed today that [name] works at a [employer] facility and checks her blood sugars at work intermittantly however none of her readings are fasting. She recently purchased a new meter from [place] but she has been unsure of how to use. There were only three readings noted today the first mid December.   She has recently been referred to [facility] diabetes education classes and has had her first intake assessment/education and was asked to check her blood glucose levels and record. She has not began that process as of date but with the education and instruction she has received today on the use of her meter she is able to begin tracking her numbers. She is almost out of strips but states she is able to pick up more. She will begin her weekly DM classes on 11/30/17.  Ms.  Julia Robertson understands and is able to verbalize foods that will increase her glucose levels and importance of protein at each meal. She is able to verbalize symptoms of hypoglycemia and understands how to treat. She carries appropriate snacks at all times. She admits she has the knowledge to care for her self and lower her A1C, it is just a matter of "doing it". She was educated on secondary complications of uncontrolled DM (stroke, MI, neuropathy, renal disease, poor healing wounds, and poor eyesight) during today's  visit.  Nurse Case Manager Clinical Goal(s): Over the next 14 days, patient will verbalize better understanding of self care activities related to DM including adherence to ADA diet, glucose monitoring, and medication adherance  Interventions:  -Counseled patient on low blood sugar action steps -provided patient with [facility] calender and reviewed where to record glucose levels -provided patient with basic DM education in collaboration with Clinic Pharmacist   Patient Self Care Activities:  1. Check your blood sugar using your meter twice a day: once when you first wake up and once 2 hours after last meal of the day  Please see past updates related to this goal by clicking on the "Past Updates" button in the selected goal    Pharmacist Clinical Goal(s): Over the next 14 days, patient will be adherent to all diabetes medications as evidenced by >80% PDC.   Interventions:  -Counsel patient on the importance of adherence -Provide Ozempic coupon to lower copay  Patient Self Care Activities: ex. Takes medications (independent), administers insulin (in progress), checks cbg daily (independent) 2. Take all medications as prescribed (independent) 3. Activate Ozempic coupon and present to pharmacy  Please see past updates related to this goal by clicking on the "Past Updates" button in the selected goal        Plan: CCM Team will follow up with [name] in 14 days. Will continue discussion of HTN and effects of sodium of BP  [name] was given information about Care Management services today including:  1. Case Management services include personalized support from designated clinical staff supervised by a physician, including individualized plan of care and coordination with other care providers 2. 24/7 contact phone numbers for assistance for urgent and routine care needs. 3. The patient may stop CCM services at any time (effective at the end of the month) by phone call to the office  staff.  Patient agreed to services and verbal consent obtained.  Adayah Arocho E. Rollene Rotunda, RN, BSN Nurse Care Coordinator [facility] Care Management [phone]

## 2018-11-23 NOTE — Patient Instructions (Addendum)
1. Activate Ozempic savings card and present card to pharmacy.  2. Check and record you blood sugar first thing in the morning before you eat and two hours after your last meal 3. Please review the written information provided to you today. 4. The CCM team will follow up with you in 14 days  Please call a member of the CCM (Chronic Care Management) Team with any questions or case management needs:   Vanetta Mulders, BSN Nurse Care Coordinator  [phone]  Ruben Reason, PharmD  Clinical Pharmacist  [phone]    Diet Recommendations for Diabetes  Carbohydrate includes starch, sugar, and fiber.  Of these, only sugar and starch raise blood glucose.  (Fiber is found in fruits, vegetables [especially skin, seeds, and stalks] and whole grains.)   Starchy (carb) foods: Bread, rice, pasta, potatoes, corn, cereal, grits, crackers, bagels, muffins, all baked goods.  (Fruit, milk, and yogurt also have carbohydrate, but most of these foods will not spike your blood sugar as most starchy foods will.)  A few fruits do cause high blood sugars; use small portions of bananas (limit to 1/2 at a time), grapes, watermelon, oranges, and most tropical fruits.   Protein foods: Meat, fish, poultry, eggs, dairy foods, and beans such as pinto and kidney beans (beans also provide carbohydrate).   Examples of good snacks: peanut butter crackers, cheese and crackers, apples and peanut butter, cheese and grapes, Greek yogurt, berries and [employer] of good meals: eggs and spinach omelet (breakfast), tuna fish and crackers (lunch), grilled chicken and 2 colorful vegetables (supper)  1. Eat at least REAL 3 meals and 1-2 snacks per day. Never go more than 4-5 hours while awake without eating. Eat breakfast within the first hour of getting up.   2. Limit starchy foods to TWO per meal and ONE per snack. ONE portion of a starchy  food is equal to the following:   - ONE slice of bread (or its equivalent,  such as half of a hamburger bun).   - 1/2 cup of a "scoopable" starchy food such as potatoes or rice.   - 15 grams of Total Carbohydrate as shown on food label.   - Every 4 ounces of a sweet drink (including fruit juice) 3. Include at every meal: a protein food, a carb food, and vegetables and/or fruit.   - Obtain twice the volume of veg's as protein or carbohydrate foods for both lunch and dinner.   - Fresh or frozen veg's are best.   - Keep frozen veg's on hand for a quick vegetable serving.       Goals Addressed            This Visit's Progress   . "I need to learn how to use my glucose meter" (pt-stated)       Nurse Case Manager Clinical Goal(s): Over the next 14 days, patient will verbalize independence with checking blood glucose levels with her meter  Interventions: Demonstrated use of patients personal glucometer and assessed patient's ability to return demonstrate  Patient Self Care Activities:  1. Independently checks blood glucose levels and records 2. Monitor daily intake of carbohydrates      . "I want to do better with my diabetes" (pt-stated)       Nurse Case Manager Clinical Goal(s): Over the next 14 days, patient will verbalize better understanding of self care activities related to DM including adherence to ADA diet, glucose monitoring, and medication adherance  Interventions:  -  Counseled patient on low blood sugar action steps -provided patient with [facility] calender and reviewed where to record glucose levels -provided patient with basic DM education in collaboration with Clinic Pharmacist   Patient Self Care Activities:  1. Check your blood sugar using your meter twice a day: once when you first wake up and once 2 hours after last meal of the day  Please see past updates related to this goal by clicking on the "Past Updates" button in the selected goal    Pharmacist Clinical Goal(s): Over the next 14 days, patient will be adherent to all diabetes medications  as evidenced by >80% PDC.   Interventions:  -Counsel patient on the importance of adherence -Provide Ozempic coupon to lower copay  Patient Self Care Activities: ex. Takes medications (independent), administers insulin (in progress), checks cbg daily (independent) 2. Take all medications as prescribed (independent) 3. Activate Ozempic coupon and present to pharmacy  Please see past updates related to this goal by clicking on the "Past Updates" button in the selected goal

## 2018-11-24 NOTE — Chronic Care Management (AMB) (Signed)
Care Management   Note  11/24/2018 Name: Julia Robertson MRN: [id number] DOB: 13-Feb-1956  Subjective:   Does the patient  feel that his/her medications are working for him/her?  "mostly"  Has the patient been experiencing any side effects to the medications prescribed?  no  Does the patient measure his/her own blood glucose at home?  no   Does the patient measure his/her own blood pressure at home? no   Does the patient have any problems obtaining medications due to transportation or finances?   no  Understanding of regimen: good  Understanding of indications: good Potential of compliance: fair  Objective: Lab Results  Component Value Date   CREATININE 0.70 11/06/2018   CREATININE 0.71 09/30/2018   CREATININE 0.76 12/05/2017    Lab Results  Component Value Date   HGBA1C 11.1 (A) 10/30/2018    Lipid Panel     Component Value Date/Time   CHOL 206 (H) 09/30/2018 0912   TRIG 168 (H) 09/30/2018 0912   HDL 41 09/30/2018 0912   CHOLHDL 4.8 (H) 12/05/2017 0936   LDLCALC 131 (H) 09/30/2018 0912    BP Readings from Last 3 Encounters:  11/12/18 (!) 167/85  11/06/18 (!) 143/83  11/02/18 (!) 162/96    No Known Allergies  Medications Reviewed Today    Reviewed by Doristine Devoid, CMA (Certified Medical Assistant) on 11/12/18 at [place] List Status: <None>  Medication Order Taking? Sig Documenting Provider Last Dose Status Informant  amLODipine (NORVASC) 10 MG tablet [id number] Yes Take 1 tablet (10 mg total) by mouth daily. Trinna Post, PA-C Taking Active   BIOTIN PO [id number] Yes Take 1 tablet by mouth daily.  [provider] Taking Active   gabapentin (NEURONTIN) 300 MG capsule [id number] Yes Take 1 capsule (300 mg total) by mouth at bedtime. Trinna Post, PA-C Taking Active   lisinopril (PRINIVIL,ZESTRIL) 20 MG tablet [id number] Yes Take 1 tablet (20 mg total) by mouth daily. Trinna Post, PA-C Taking Active   metFORMIN (GLUCOPHAGE)  1000 MG tablet [id number] Yes Take 1 tablet (1,000 mg total) by mouth 2 (two) times daily with a meal. Terrilee Croak, Adriana M, PA-C Taking Active   Multiple Vitamin (MULTIVITAMIN) capsule [id number] Yes Take 1 capsule by mouth daily. [provider] Taking Active   Semaglutide, 1 MG/DOSE, (OZEMPIC, 1 MG/DOSE,) 2 MG/1.5ML SOPN [id number] Yes Inject 1 mg into the skin once a week. Trinna Post, PA-C Taking Active   traMADol Veatrice Bourbon) 50 MG tablet [id number] Yes Take 1 tablet (50 mg total) by mouth every 8 (eight) hours as needed for severe pain. Trinna Post, PA-C Taking Active   traZODone (DESYREL) 100 MG tablet [id number] Yes Take 1 tablet (100 mg total) by mouth at bedtime. Trinna Post, PA-C Taking Active            Assessment:   Total Number of meds: 9  (polypharmacy > 10 meds)  Indications for all medications: [x]  Yes       []  No  Adherence Review  []  Excellent (no doses missed/week)     []  Good (no more than 1 dose missed/week)     [x]  Partial (2-3 doses missed/week)     []  Poor (>3 doses missed/week)  Intervention  YES NO  Explanation  Needs additional therapy      Medication requires monitoring []  []    Preventative therapy   [x]  []  Patient is indicated for a statin based on T2DM and lipid levels; however, continues  to have elevated LFTs of unknown origin.   Possible untreated condition   [x]  []  Patient is indicated for a statin based on T2DM and lipid levels; however, continues to have elevated LFTs of unknown origin.  Synergistic therapy   []  []     Adherence      Cannot afford medication []  []     Cannot self-administer medication appropriately [x]  []  Was unsure of how to use glucometer   Does not understand directions []  []     Prefers not to take [x]  []  Refill history and chart review reflect frequent missed doses   Product unavailable []  []     Forgets to take []  []     Other pertinent pharmacist  counseling   Counseled patient on adherence, general DM  education, and provided Ozempic coupon   Goals Addressed            This Visit's Progress   . "I need to learn how to use my glucose meter" (pt-stated)       Nurse Case Manager Clinical Goal(s): Over the next 14 days, patient will verbalize independence with checking blood glucose levels with her meter  Interventions: Demonstrated use of patients personal glucometer and assessed patient's ability to return demonstrate. Reviewed importance of daily monitoring (morning fasting and 2 hours after her evening meal). Provided patient with daily CBG log for recording numbers.     Patient Self Care Activities:  1. Independently checks blood glucose levels and records 2. Monitor daily intake of carbohydrates      . "I want to do better with my diabetes" (pt-stated)       Nurse Case Manager Clinical Goal(s): Over the next 14 days, patient will verbalize better understanding of self care activities related to DM including adherence to ADA diet, glucose monitoring, and medication adherance  Interventions:  -Counseled patient on low blood sugar action steps -provided patient with [facility] calendar and reviewed where to record glucose levels -provided patient with basic DM education in collaboration with Clinic Pharmacist   Patient Self Care Activities:  1. Check your blood sugar using your meter twice a day: once when you first wake up and once 2 hours after last meal of the day  Please see past updates related to this goal by clicking on the "Past Updates" button in the selected goal    Pharmacist Clinical Goal(s): Over the next 14 days, patient will be adherent to all diabetes medications as evidenced by >80% PDC.   Interventions:  -Counsel patient on the importance of adherence -Provide Ozempic coupon to lower copay  Patient Self Care Activities:  2. Take all medications as prescribed (independent) 3. Activate Ozempic coupon and present to pharmacy  Please see past updates related to  this goal by clicking on the "Past Updates" button in the selected goal          Time:  Time spent counseling patient: 65 minutes Additional time spent on charting: 10 minutes  Plan: Recommendations discussed with provider - consider statin once LFTs normalize - BP above goal. Patient is recording BP at home to assess need for additional therapy  Recommendations discussed with patient - Ozempic coupon to lower copay - adherence strategies - DM education - avoid APAP, alcohol, herbal medications   Ruben Reason, PharmD Clinical Pharmacist [place] [phone]

## 2018-11-30 ENCOUNTER — Encounter: Payer: BLUE CROSS/BLUE SHIELD | Attending: Physician Assistant | Admitting: Dietician

## 2018-11-30 ENCOUNTER — Encounter: Payer: Self-pay | Admitting: Dietician

## 2018-11-30 VITALS — Ht 64.0 in | Wt 248.0 lb

## 2018-11-30 DIAGNOSIS — E119 Type 2 diabetes mellitus without complications: Secondary | ICD-10-CM | POA: Insufficient documentation

## 2018-11-30 DIAGNOSIS — E1140 Type 2 diabetes mellitus with diabetic neuropathy, unspecified: Secondary | ICD-10-CM

## 2018-11-30 NOTE — Progress Notes (Signed)

## 2018-12-07 ENCOUNTER — Ambulatory Visit: Payer: BLUE CROSS/BLUE SHIELD | Admitting: Physician Assistant

## 2018-12-07 ENCOUNTER — Encounter: Payer: Self-pay | Admitting: Physician Assistant

## 2018-12-07 ENCOUNTER — Encounter: Payer: Self-pay | Admitting: Dietician

## 2018-12-07 ENCOUNTER — Encounter: Payer: BLUE CROSS/BLUE SHIELD | Admitting: Dietician

## 2018-12-07 VITALS — Wt 242.3 lb

## 2018-12-07 VITALS — BP 144/83 | HR 96 | Temp 98.2°F | Resp 16 | Wt 242.8 lb

## 2018-12-07 DIAGNOSIS — I152 Hypertension secondary to endocrine disorders: Secondary | ICD-10-CM

## 2018-12-07 DIAGNOSIS — E119 Type 2 diabetes mellitus without complications: Secondary | ICD-10-CM | POA: Diagnosis not present

## 2018-12-07 DIAGNOSIS — E1140 Type 2 diabetes mellitus with diabetic neuropathy, unspecified: Secondary | ICD-10-CM

## 2018-12-07 DIAGNOSIS — I10 Essential (primary) hypertension: Secondary | ICD-10-CM | POA: Diagnosis not present

## 2018-12-07 DIAGNOSIS — E1159 Type 2 diabetes mellitus with other circulatory complications: Secondary | ICD-10-CM

## 2018-12-07 DIAGNOSIS — R748 Abnormal levels of other serum enzymes: Secondary | ICD-10-CM | POA: Diagnosis not present

## 2018-12-07 MED ORDER — LISINOPRIL-HYDROCHLOROTHIAZIDE 20-25 MG PO TABS: 1.0000 | ORAL_TABLET | Freq: Every day | ORAL | 0 refills | Status: DC

## 2018-12-07 NOTE — Progress Notes (Signed)
Patient: Julia Robertson Female    DOB: 21-Sep-1956   63 y.o.   MRN: [id number] Visit Date: 12/08/2018  Today's Provider: Trinna Post, PA-C   Chief Complaint  Patient presents with  . Diabetes  . Hypertension  . Hyperlipidemia   Subjective:     HPI     Diabetes Mellitus Type II, Follow-up:   Lab Results  Component Value Date   HGBA1C 11.1 (A) 10/30/2018   HGBA1C 10.8 (H) 09/30/2018   HGBA1C 8.1 (A) 06/04/2018   Last seen for diabetes 1 months ago.  Management since then includes speaking with CCM about managing diabetes, patient has a follow up appointment scheduled today with [place]. She reports excellent compliance with treatment. She is not having side effects.  Current symptoms include none and have been stable. Home blood sugar records: fasting range: 102-162  Episodes of hypoglycemia? yes - patient reports one recent episode and she states that she had symptoms of shakiness and dizziness.    Current Insulin Regimen: Ozempic once a week Most Recent Eye Exam: patient reports Oct/Nov of 2019 Weight trend: decreasing steadily Prior visit with dietician: yes - scheduled for today Current diet: well balanced Current exercise: walking  ------------------------------------------------------------------------   Hypertension, follow-up:  BP Readings from Last 3 Encounters:  12/07/18 (!) 144/83  11/12/18 (!) 167/85  11/06/18 (!) 143/83    She was last seen for hypertension 1 months ago.  BP at that visit was 167/85. Management since that visit includes increasing Amlodipine to 10 mg daily. Also currently taking 20 mg lisinopril. .She reports excellent compliance with treatment. She is not having side effects.  She is exercising. She is adherent to low salt diet.   Outside blood pressures are systolic ranging in the 409W and diastolic ranging from 11-914N. She is experiencing none.  Patient denies chest pain, chest  pressure/discomfort, claudication, dyspnea, exertional chest pressure/discomfort, fatigue, irregular heart beat, lower extremity edema, near-syncope, orthopnea, palpitations, paroxysmal nocturnal dyspnea, syncope and tachypnea.   Cardiovascular risk factors include advanced age (older than 83 for men, 9 for women), diabetes mellitus, hypertension and obesity (BMI >= 30 kg/m2).  Use of agents associated with hypertension: none.   ------------------------------------------------------------------------    Lipid/Cholesterol, Follow-up:   Last seen for this 1 months ago.  Management since that visit includes none.  Last Lipid Panel:    Component Value Date/Time   CHOL 206 (H) 09/30/2018 0912   TRIG 168 (H) 09/30/2018 0912   HDL 41 09/30/2018 0912   CHOLHDL 4.8 (H) 12/05/2017 0936   LDLCALC 131 (H) 09/30/2018 0912    She reports excellent compliance with treatment. She is not having side effects.   Wt Readings from Last 3 Encounters:  12/07/18 242 lb 4.8 oz (109.9 kg)  12/07/18 242 lb 12.8 oz (110.1 kg)  11/30/18 248 lb (112.5 kg)    ------------------------------------------------------------------------  No Known Allergies   Current Outpatient Medications:  .  amLODipine (NORVASC) 10 MG tablet, Take 1 tablet (10 mg total) by mouth daily., Disp: 90 tablet, Rfl: 0 .  cyclobenzaprine (FLEXERIL) 10 MG tablet, Take 1 tablet (10 mg total) by mouth 2 (two) times daily as needed for muscle spasms., Disp: 30 tablet, Rfl: 0 .  gabapentin (NEURONTIN) 300 MG capsule, Take 1 capsule (300 mg total) by mouth at bedtime., Disp: 90 capsule, Rfl: 0 .  glucose blood test strip, Use as instructed, Disp: 100 each, Rfl: 12 .  meloxicam (MOBIC) 7.5 MG  tablet, Take 1 tablet (7.5 mg total) by mouth daily., Disp: 30 tablet, Rfl: 0 .  metFORMIN (GLUCOPHAGE) 1000 MG tablet, Take 1 tablet (1,000 mg total) by mouth 2 (two) times daily with a meal., Disp: 60 tablet, Rfl: 5 .  Multiple Vitamin  (MULTIVITAMIN) capsule, Take 1 capsule by mouth daily., Disp: , Rfl:  .  Semaglutide, 1 MG/DOSE, (OZEMPIC, 1 MG/DOSE,) 2 MG/1.5ML SOPN, Inject 1 mg into the skin once a week., Disp: 6 pen, Rfl: 0 .  traMADol (ULTRAM) 50 MG tablet, Take 1 tablet (50 mg total) by mouth every 8 (eight) hours as needed for severe pain., Disp: 15 tablet, Rfl: 0 .  traZODone (DESYREL) 100 MG tablet, Take 1 tablet (100 mg total) by mouth at bedtime., Disp: 30 tablet, Rfl: 5 .  lisinopril-hydrochlorothiazide (PRINZIDE,ZESTORETIC) 20-25 MG tablet, Take 1 tablet by mouth daily., Disp: 90 tablet, Rfl: 0  Review of Systems  All other systems reviewed and are negative.   Social History   Tobacco Use  . Smoking status: Never Smoker  . Smokeless tobacco: Never Used  Substance Use Topics  . Alcohol use: No      Objective:   BP (!) 144/83   Pulse 96   Temp 98.2 F (36.8 C) (Oral)   Resp 16   Wt 242 lb 12.8 oz (110.1 kg)   SpO2 96%   BMI 41.68 kg/m  Vitals:   12/07/18 1649  BP: (!) 144/83  Pulse: 96  Resp: 16  Temp: 98.2 F (36.8 C)  TempSrc: Oral  SpO2: 96%  Weight: 242 lb 12.8 oz (110.1 kg)     Physical Exam Constitutional:      Appearance: Normal appearance.  Cardiovascular:     Rate and Rhythm: Normal rate and regular rhythm.  Pulmonary:     Effort: Pulmonary effort is normal.     Breath sounds: Normal breath sounds.  Skin:    General: Skin is warm and dry.  Neurological:     Mental Status: She is alert.    Diabetic Foot Exam - Simple   Simple Foot Form Diabetic Foot exam was performed with the following findings:  Yes 12/07/2018  4:45 PM  Visual Inspection No deformities, no ulcerations, no other skin breakdown bilaterally:  Yes Sensation Testing Intact to touch and monofilament testing bilaterally:  Yes Pulse Check Posterior Tibialis and Dorsalis pulse intact bilaterally:  Yes Comments         Assessment & Plan    1. Hypertension associated with diabetes (HCC)  Add 25 mg  hctz daily to lisinopril 20 mg and 10 mg amlodipine. She has been on HCTZ before.   - lisinopril-hydrochlorothiazide (PRINZIDE,ZESTORETIC) 20-25 MG tablet; Take 1 tablet by mouth daily.  Dispense: 90 tablet; Refill: 0  2. Elevated liver enzymes  Hepatitis C and iron panel came back normal. Will get [place] as below, suspect fatty liver. If [place] normal, plan to initiate statin.   - US Abdomen Limited RUQ; Future  3. Type 2 diabetes mellitus without complication, without long-term current use of insulin (HCC)  Improving, she is compliant with her medications and is attending nutrition classes.  Return in about 2 months (around 02/05/2019) for DM a1c, HTN, HLD.  The entirety of the information documented in the History of Present Illness, Review of Systems and Physical Exam were personally obtained by me. Portions of this information were initially documented by Lynford Humphrey, CMA and reviewed by me for thoroughness and accuracy.  Trinna Post, PA-C  [facility]

## 2018-12-07 NOTE — Progress Notes (Signed)

## 2018-12-08 NOTE — Patient Instructions (Signed)
Diabetes Mellitus and Exercise Exercising regularly is important for your overall health, especially when you have diabetes (diabetes mellitus). Exercising is not only about losing weight. It has many other health benefits, such as increasing muscle strength and bone density and reducing body fat and stress. This leads to improved fitness, flexibility, and endurance, all of which result in better overall health. Exercise has additional benefits for people with diabetes, including:  Reducing appetite.  Helping to lower and control blood glucose.  Lowering blood pressure.  Helping to control amounts of fatty substances (lipids) in the blood, such as cholesterol and triglycerides.  Helping the body to respond better to insulin (improving insulin sensitivity).  Reducing how much insulin the body needs.  Decreasing the risk for heart disease by: ? Lowering cholesterol and triglyceride levels. ? Increasing the levels of good cholesterol. ? Lowering blood glucose levels. What is my activity plan? Your health care provider or certified diabetes educator can help you make a plan for the type and frequency of exercise (activity plan) that works for you. Make sure that you:  Do at least 150 minutes of moderate-intensity or vigorous-intensity exercise each week. This could be brisk walking, biking, or water aerobics. ? Do stretching and strength exercises, such as yoga or weightlifting, at least 2 times a week. ? Spread out your activity over at least 3 days of the week.  Get some form of physical activity every day. ? Do not go more than 2 days in a row without some kind of physical activity. ? Avoid being inactive for more than 30 minutes at a time. Take frequent breaks to walk or stretch.  Choose a type of exercise or activity that you enjoy, and set realistic goals.  Start slowly, and gradually increase the intensity of your exercise over time. What do I need to know about managing my  diabetes?   Check your blood glucose before and after exercising. ? If your blood glucose is 240 mg/dL (13.3 mmol/L) or higher before you exercise, check your urine for ketones. If you have ketones in your urine, do not exercise until your blood glucose returns to normal. ? If your blood glucose is 100 mg/dL (5.6 mmol/L) or lower, eat a snack containing 15-20 grams of carbohydrate. Check your blood glucose 15 minutes after the snack to make sure that your level is above 100 mg/dL (5.6 mmol/L) before you start your exercise.  Know the symptoms of low blood glucose (hypoglycemia) and how to treat it. Your risk for hypoglycemia increases during and after exercise. Common symptoms of hypoglycemia can include: ? Hunger. ? Anxiety. ? Sweating and feeling clammy. ? Confusion. ? Dizziness or feeling light-headed. ? Increased heart rate or palpitations. ? Blurry vision. ? Tingling or numbness around the mouth, lips, or tongue. ? Tremors or shakes. ? Irritability.  Keep a rapid-acting carbohydrate snack available before, during, and after exercise to help prevent or treat hypoglycemia.  Avoid injecting insulin into areas of the body that are going to be exercised. For example, avoid injecting insulin into: ? The arms, when playing tennis. ? The legs, when jogging.  Keep records of your exercise habits. Doing this can help you and your health care provider adjust your diabetes management plan as needed. Write down: ? Food that you eat before and after you exercise. ? Blood glucose levels before and after you exercise. ? The type and amount of exercise you have done. ? When your insulin is expected to peak, if you use   insulin. Avoid exercising at times when your insulin is peaking.  When you start a new exercise or activity, work with your health care provider to make sure the activity is safe for you, and to adjust your insulin, medicines, or food intake as needed.  Drink plenty of water while  you exercise to prevent dehydration or heat stroke. Drink enough fluid to keep your urine clear or pale yellow. Summary  Exercising regularly is important for your overall health, especially when you have diabetes (diabetes mellitus).  Exercising has many health benefits, such as increasing muscle strength and bone density and reducing body fat and stress.  Your health care provider or certified diabetes educator can help you make a plan for the type and frequency of exercise (activity plan) that works for you.  When you start a new exercise or activity, work with your health care provider to make sure the activity is safe for you, and to adjust your insulin, medicines, or food intake as needed. This information is not intended to replace advice given to you by your health care provider. Make sure you discuss any questions you have with your health care provider. Document Released: 02/01/2004 Document Revised: 05/22/2017 Document Reviewed: 04/22/2016 Elsevier Interactive Patient Education  2019 Elsevier Inc.  

## 2018-12-09 ENCOUNTER — Telehealth: Payer: Self-pay

## 2018-12-09 ENCOUNTER — Ambulatory Visit: Payer: Self-pay

## 2018-12-09 DIAGNOSIS — I152 Hypertension secondary to endocrine disorders: Secondary | ICD-10-CM

## 2018-12-09 DIAGNOSIS — E119 Type 2 diabetes mellitus without complications: Secondary | ICD-10-CM

## 2018-12-09 DIAGNOSIS — I10 Essential (primary) hypertension: Principal | ICD-10-CM

## 2018-12-09 DIAGNOSIS — E1159 Type 2 diabetes mellitus with other circulatory complications: Secondary | ICD-10-CM

## 2018-12-09 NOTE — Chronic Care Management (AMB) (Signed)
  Care Management   Note  12/09/2018 Name: Julia Robertson MRN: [id number] DOB: 26-Oct-1956  Assessment: [name] a 62y.o.year old femalewho sees Carles Collet PA-Cfor primary care.Adriana Pollakasked the CCM team to consult the patient for assistance with chronic disease management related toDM and patients most recent A1C of 11.1. Referral was placed12/13/19. Today CCM RN CM attempted to contact [name] via telephone for a 2 week follow up to discuss progression towards established goals.  Was unable to reach patient via telephone. I have left HIPAA compliant voicemail asking patient to return my call. (unsuccessful outreach #1).  Plan: Will follow-up within 3-5  business days via telephone.   Kyle Luppino E. Rollene Rotunda, RN, BSN Nurse Care Coordinator [facility] Practice/[facility] Care Management [phone]

## 2018-12-09 NOTE — Patient Instructions (Addendum)
1. Activate Ozempic savings card and present card to pharmacy when you have a chance.  2. Continue to check and record you blood sugar first thing in the morning before you eat and two hours after your last meal. You are doing a great job!! 3. Continue to utilize the plate method discussed and follow instructions/education provided during your nutrition classes 4. You have met your healthcare goals! The CCM Team remains available to assist you with any health needs that may arise.  Please call a member of the CCM (Chronic Care Management) Team with any questions or case management needs:   Vanetta Mulders, BSN Nurse Care Coordinator  [phone]  Ruben Reason, PharmD  Clinical Pharmacist  [phone]  Goals Addressed            This Visit's Progress   . COMPLETED: "I need to learn how to use my glucose meter" (pt-stated)       Nurse Case Manager Clinical Goal(s): Over the next 14 days, patient will verbalize independence with checking blood glucose levels with her meter  Interventions: Demonstrated use of patients personal glucometer and assessed patient's ability to return demonstrate. Reviewed importance of daily monitoring (morning fasting and 2 hours after her evening meal). Provided patient with daily CBG log for recording numbers.     Patient Self Care Activities:  1. Independently checks blood glucose levels and records 2. Monitor daily intake of carbohydrates      . COMPLETED: "I want to do better with my diabetes" (pt-stated)       Nurse Case Manager Clinical Goal(s): Over the next 14 days, patient will verbalize better understanding of self care activities related to DM including adherence to ADA diet, glucose monitoring, and medication adherance  Interventions:  -Counseled patient on low blood sugar action steps -provided patient with [facility] calendar and reviewed where to record glucose levels -provided patient with basic DM education in collaboration with  Clinic Pharmacist   Patient Self Care Activities:  1. Check your blood sugar using your meter twice a day: once when you first wake up and once 2 hours after last meal of the day  Please see past updates related to this goal by clicking on the "Past Updates" button in the selected goal    Pharmacist Clinical Goal(s): Over the next 14 days, patient will be adherent to all diabetes medications as evidenced by >80% PDC.   Interventions:  -Counsel patient on the importance of adherence -Provide Ozempic coupon to lower copay  Patient Self Care Activities:  2. Take all medications as prescribed (independent) 3. Activate Ozempic coupon and present to pharmacy  Please see past updates related to this goal by clicking on the "Past Updates" button in the selected goal     The patient verbalized understanding of instructions provided today and declined a print copy of patient instruction materials.

## 2018-12-09 NOTE — Chronic Care Management (AMB) (Signed)
Care Management   Follow Up Note   12/09/2018 Name: Julia Robertson MRN: [id number] DOB: 05-18-1956  Referred by: Mar Daring, PA-C Reason for referral : Chronic Care Management (patient returning call-follow up DM)    Subjective: "I am eating healthier, checking my sugars, and taking my medicines but I have not used the card to lower my Ozempic cost yet"   Objective:  Lab Results  Component Value Date   HGBA1C 11.1 (A) 10/30/2018     Assessment:   Review of patient status, including review of consultants reports, relevant laboratory and other test results, and collaboration with appropriate care team members and the patient's provider was performed as part of comprehensive patient evaluation and provision of chronic care management services.    Goals Addressed            This Visit's Progress   . COMPLETED: "I need to learn how to use my glucose meter" (pt-stated)       [name] states she is now comfortable using her glucometer and checks her sugars each morning and after her last meal.  Nurse Case Manager Clinical Goal(s): Over the next 14 days, patient will verbalize independence with checking blood glucose levels with her meter  Interventions: Demonstrated use of patients personal glucometer and assessed patient's ability to return demonstrate. Reviewed importance of daily monitoring (morning fasting and 2 hours after her evening meal). Provided patient with daily CBG log for recording numbers.  Patient Self Care Activities:  1. Independently checks blood glucose levels and records 2. Monitor daily intake of carbohydrates    . COMPLETED: "I want to do better with my diabetes" (pt-stated)       [name] feels she is making strides in bettering her health in attempt to lower her A1C. She has been diagnosed with DM for several years and was keeping her A1C controlled until last year when she just decided she would eat "whatever she wanted". Her A1C  significantly elevated to 11.1. She understands he diabetes is uncontrolled which puts her at risk for CAD, CKD, and poor eyesight. She is now eating a healthy diet, going to nutrition classes at [facility], checking her blood glucose levels (fasting numbers between 120/130 daily) and taking her medications as prescribed. She has not utilized her Ozempic copay card to lower price but plans to drop off at the pharmacy soon. She is still taking Ozempic.    Nurse Case Manager Clinical Goal(s): Over the next 14 days, patient will verbalize better understanding of self care activities related to DM including adherence to ADA diet, glucose monitoring, and medication adherance  Interventions:  -Counseled patient on low blood sugar action steps -provided patient with [facility] calendar and reviewed where to record glucose levels -provided patient with basic DM education in collaboration with Clinic Pharmacist   Patient Self Care Activities:  1. Check your blood sugar using your meter twice a day: once when you first wake up and once 2 hours after last meal of the day  Please see past updates related to this goal by clicking on the "Past Updates" button in the selected goal    Pharmacist Clinical Goal(s): Over the next 14 days, patient will be adherent to all diabetes medications as evidenced by >80% PDC.   Interventions:  -Counsel patient on the importance of adherence -Provide Ozempic coupon to lower copay  Patient Self Care Activities:  2. Take all medications as prescribed (independent) 3. Activate Ozempic coupon and present to pharmacy  Please see past updates related to this goal by clicking on the "Past Updates" button in the selected goal       Follow Up Plan: [name] has met all care management goals. Review of patient status, including review of consultants reports, relevant laboratory and other test results, and collaboration with appropriate care team members and the patient's  provider was performed as part of comprehensive patient evaluation and provision of chronic care management services.  The care management team is available to [name] at any time to assist with care management needs should they arise. Ms. has been given contact information and instructions to contact the care management team with any questions or should new care management needs arise.      Larry Knipp E. Rollene Rotunda, RN, BSN Nurse Care Coordinator [facility] Practice/[facility] Care Management [phone]

## 2018-12-10 ENCOUNTER — Telehealth: Payer: Self-pay

## 2018-12-10 ENCOUNTER — Ambulatory Visit: Payer: BLUE CROSS/BLUE SHIELD

## 2018-12-10 NOTE — Telephone Encounter (Signed)
Julia Robertson with the [place] department calling that patient No Show to her appt. this morning.

## 2018-12-10 NOTE — Telephone Encounter (Signed)
Noted, thanks!

## 2018-12-14 ENCOUNTER — Encounter: Payer: BLUE CROSS/BLUE SHIELD | Admitting: Dietician

## 2018-12-14 ENCOUNTER — Telehealth: Payer: Self-pay

## 2018-12-14 ENCOUNTER — Encounter: Payer: Self-pay | Admitting: Dietician

## 2018-12-14 VITALS — BP 150/90 | Ht 64.0 in | Wt 244.9 lb

## 2018-12-14 DIAGNOSIS — E1140 Type 2 diabetes mellitus with diabetic neuropathy, unspecified: Secondary | ICD-10-CM

## 2018-12-14 DIAGNOSIS — E119 Type 2 diabetes mellitus without complications: Secondary | ICD-10-CM | POA: Diagnosis not present

## 2018-12-14 NOTE — Progress Notes (Signed)

## 2018-12-15 ENCOUNTER — Encounter: Payer: Self-pay | Admitting: Dietician

## 2019-01-26 ENCOUNTER — Other Ambulatory Visit: Payer: Self-pay | Admitting: Physician Assistant

## 2019-01-26 DIAGNOSIS — G6289 Other specified polyneuropathies: Secondary | ICD-10-CM

## 2019-02-08 ENCOUNTER — Ambulatory Visit: Payer: BLUE CROSS/BLUE SHIELD | Admitting: Physician Assistant

## 2019-03-10 ENCOUNTER — Other Ambulatory Visit: Payer: Self-pay | Admitting: Physician Assistant

## 2019-03-10 DIAGNOSIS — E119 Type 2 diabetes mellitus without complications: Secondary | ICD-10-CM

## 2019-03-10 MED ORDER — SEMAGLUTIDE (1 MG/DOSE) 2 MG/1.5ML ~~LOC~~ SOPN: 1.0000 mg | PEN_INJECTOR | SUBCUTANEOUS | 0 refills | Status: DC

## 2019-03-10 NOTE — Telephone Encounter (Signed)
[place] faxed refill request for the following medications:  Semaglutide, 1 MG/DOSE, (OZEMPIC, 1 MG/DOSE,) 2 MG/1.5ML SOPN    Please advise.

## 2019-03-23 ENCOUNTER — Telehealth: Payer: Self-pay | Admitting: Physician Assistant

## 2019-03-23 NOTE — Telephone Encounter (Signed)
[email or web address] 

## 2019-03-23 NOTE — Telephone Encounter (Signed)
Needs to schedule dm follow up. Can do drive by A0O and virtual visit or in person.

## 2019-03-24 ENCOUNTER — Telehealth: Payer: Self-pay

## 2019-03-24 DIAGNOSIS — E119 Type 2 diabetes mellitus without complications: Secondary | ICD-10-CM

## 2019-03-24 MED ORDER — DULAGLUTIDE 1.5 MG/0.5ML ~~LOC~~ SOAJ: 1.5000 mg | SUBCUTANEOUS | 0 refills | Status: DC

## 2019-03-24 NOTE — Telephone Encounter (Signed)
Noted, I will send her in 1.5 mg weekly of trulicity. It is a similar medication to ozempic but a different pen. She should take it one week from her last ozempic dose. She also needs to schedule a follow up for DM. She can do drive by H7V with e visit or come into clinic.

## 2019-03-24 NOTE — Telephone Encounter (Signed)
LVM

## 2019-03-24 NOTE — Telephone Encounter (Signed)
Insurance will not cover Ozempic.  They will cover Trulicity, Jardiance, Junuvia, and [place].  She would like to get it changed to one of these.  CB [place]

## 2019-03-25 NOTE — Telephone Encounter (Signed)
Left message for patient regarding medication changes and scheduling e-visit and drive up M3V

## 2019-03-26 DIAGNOSIS — Z1159 Encounter for screening for other viral diseases: Secondary | ICD-10-CM | POA: Diagnosis not present

## 2019-04-08 ENCOUNTER — Other Ambulatory Visit: Payer: Self-pay

## 2019-04-08 ENCOUNTER — Encounter: Payer: Self-pay | Admitting: Physician Assistant

## 2019-04-08 ENCOUNTER — Telehealth: Payer: Self-pay

## 2019-04-08 ENCOUNTER — Ambulatory Visit: Payer: BLUE CROSS/BLUE SHIELD | Admitting: Physician Assistant

## 2019-04-08 VITALS — BP 111/74 | HR 91 | Temp 98.5°F | Resp 16 | Ht 64.5 in | Wt 234.2 lb

## 2019-04-08 DIAGNOSIS — R0981 Nasal congestion: Secondary | ICD-10-CM

## 2019-04-08 DIAGNOSIS — R432 Parageusia: Secondary | ICD-10-CM | POA: Diagnosis not present

## 2019-04-08 DIAGNOSIS — Z20828 Contact with and (suspected) exposure to other viral communicable diseases: Secondary | ICD-10-CM

## 2019-04-08 DIAGNOSIS — I152 Hypertension secondary to endocrine disorders: Secondary | ICD-10-CM

## 2019-04-08 DIAGNOSIS — R55 Syncope and collapse: Secondary | ICD-10-CM | POA: Diagnosis not present

## 2019-04-08 DIAGNOSIS — E1159 Type 2 diabetes mellitus with other circulatory complications: Secondary | ICD-10-CM

## 2019-04-08 DIAGNOSIS — Z20822 Contact with and (suspected) exposure to covid-19: Secondary | ICD-10-CM

## 2019-04-08 DIAGNOSIS — Z6839 Body mass index (BMI) 39.0-39.9, adult: Secondary | ICD-10-CM

## 2019-04-08 DIAGNOSIS — E119 Type 2 diabetes mellitus without complications: Secondary | ICD-10-CM

## 2019-04-08 DIAGNOSIS — I10 Essential (primary) hypertension: Secondary | ICD-10-CM

## 2019-04-08 NOTE — Patient Instructions (Signed)
This information is directly available on the CDC website: RunningShows.co.za.html    Source:CDC Reference to specific commercial products, manufacturers, companies, or trademarks does not constitute its endorsement or recommendation by the [place], [place], or Centers for [employer] and Prevention.    Person Under Monitoring Name: Julia Robertson  Location: [address] [place] [address]   Infection Prevention Recommendations for Individuals Confirmed to have, or Being Evaluated for, 2019 Novel Coronavirus (COVID-19) Infection Who Receive Care at Home  Individuals who are confirmed to have, or are being evaluated for, COVID-19 should follow the prevention steps below until a healthcare provider or local or state health department says they can return to normal activities.  Stay home except to get medical care You should restrict activities outside your home, except for getting medical care. Do not go to work, school, or public areas, and do not use public transportation or taxis.  Call ahead before visiting your doctor Before your medical appointment, call the healthcare provider and tell them that you have, or are being evaluated for, COVID-19 infection. This will help the healthcare provider's office take steps to keep other people from getting infected. Ask your healthcare provider to call the local or state health department.  Monitor your symptoms Seek prompt medical attention if your illness is worsening (e.g., difficulty breathing). Before going to your medical appointment, call the healthcare provider and tell them that you have, or are being evaluated for, COVID-19 infection. Ask your healthcare provider to call the local or state health department.  Wear a facemask You should wear a facemask that covers your nose and mouth when you are in the  same room with other people and when you visit a healthcare provider. People who live with or visit you should also wear a facemask while they are in the same room with you.  Separate yourself from other people in your home As much as possible, you should stay in a different room from other people in your home. Also, you should use a separate bathroom, if available.  Avoid sharing household items You should not share dishes, drinking glasses, cups, eating utensils, towels, bedding, or other items with other people in your home. After using these items, you should wash them thoroughly with soap and water.  Cover your coughs and sneezes Cover your mouth and nose with a tissue when you cough or sneeze, or you can cough or sneeze into your sleeve. Throw used tissues in a lined trash can, and immediately wash your hands with soap and water for at least 20 seconds or use an alcohol-based hand rub.  Wash your [facility] your hands often and thoroughly with soap and water for at least 20 seconds. You can use an alcohol-based hand sanitizer if soap and water are not available and if your hands are not visibly dirty. Avoid touching your eyes, nose, and mouth with unwashed hands.   Prevention Steps for Caregivers and Household Members of Individuals Confirmed to have, or Being Evaluated for, COVID-19 Infection Being Cared for in the Home  If you live with, or provide care at home for, a person confirmed to have, or being evaluated for, COVID-19 infection please follow these guidelines to prevent infection:  Follow healthcare provider's instructions Make sure that you understand and can help the patient follow any healthcare provider instructions for all care.  Provide for the patient's basic needs You should help the patient  with basic needs in the home and provide support for getting groceries, prescriptions, and other personal needs.  Monitor the patient's symptoms If they are getting  sicker, call his or her medical provider and tell them that the patient has, or is being evaluated for, COVID-19 infection. This will help the healthcare provider's office take steps to keep other people from getting infected. Ask the healthcare provider to call the local or state health department.  Limit the number of people who have contact with the patient  If possible, have only one caregiver for the patient.  Other household members should stay in another home or place of residence. If this is not possible, they should stay  in another room, or be separated from the patient as much as possible. Use a separate bathroom, if available.  Restrict visitors who do not have an essential need to be in the home.  Keep older adults, very young children, and other sick people away from the patient Keep older adults, very young children, and those who have compromised immune systems or chronic health conditions away from the patient. This includes people with chronic heart, lung, or kidney conditions, diabetes, and cancer.  Ensure good ventilation Make sure that shared spaces in the home have good air flow, such as from an air conditioner or an opened window, weather permitting.  Wash your hands often  Wash your hands often and thoroughly with soap and water for at least 20 seconds. You can use an alcohol based hand sanitizer if soap and water are not available and if your hands are not visibly dirty.  Avoid touching your eyes, nose, and mouth with unwashed hands.  Use disposable paper towels to dry your hands. If not available, use dedicated cloth towels and replace them when they become wet.  Wear a facemask and gloves  Wear a disposable facemask at all times in the room and gloves when you touch or have contact with the patient's blood, body fluids, and/or secretions or excretions, such as sweat, saliva, sputum, nasal mucus, vomit, urine, or feces.  Ensure the mask fits over your nose and  mouth tightly, and do not touch it during use.  Throw out disposable facemasks and gloves after using them. Do not reuse.  Wash your hands immediately after removing your facemask and gloves.  If your personal clothing becomes contaminated, carefully remove clothing and launder. Wash your hands after handling contaminated clothing.  Place all used disposable facemasks, gloves, and other waste in a lined container before disposing them with other household waste.  Remove gloves and wash your hands immediately after handling these items.  Do not share dishes, glasses, or other household items with the patient  Avoid sharing household items. You should not share dishes, drinking glasses, cups, eating utensils, towels, bedding, or other items with a patient who is confirmed to have, or being evaluated for, COVID-19 infection.  After the person uses these items, you should wash them thoroughly with soap and water.  Wash laundry thoroughly  Immediately remove and wash clothes or bedding that have blood, body fluids, and/or secretions or excretions, such as sweat, saliva, sputum, nasal mucus, vomit, urine, or feces, on them.  Wear gloves when handling laundry from the patient.  Read and follow directions on labels of laundry or clothing items and detergent. In general, wash and dry with the warmest temperatures recommended on the label.  Clean all areas the individual has used often  Clean all touchable surfaces, such as counters,  tabletops, doorknobs, bathroom fixtures, toilets, phones, keyboards, tablets, and bedside tables, every day. Also, clean any surfaces that may have blood, body fluids, and/or secretions or excretions on them.  Wear gloves when cleaning surfaces the patient has come in contact with.  Use a diluted bleach solution (e.g., dilute bleach with 1 part bleach and 10 parts water) or a household disinfectant with a label that says EPA-registered for coronaviruses. To make a  bleach solution at home, add 1 tablespoon of bleach to 1 quart (4 cups) of water. For a larger supply, add  cup of bleach to 1 gallon (16 cups) of water.  Read labels of cleaning products and follow recommendations provided on product labels. Labels contain instructions for safe and effective use of the cleaning product including precautions you should take when applying the product, such as wearing gloves or eye protection and making sure you have good ventilation during use of the product.  Remove gloves and wash hands immediately after cleaning.  Monitor yourself for signs and symptoms of illness Caregivers and household members are considered close contacts, should monitor their health, and will be asked to limit movement outside of the home to the extent possible. Follow the monitoring steps for close contacts listed on the symptom monitoring form.   ? If you have additional questions, contact your local health department or call the epidemiologist on call at [phone] (available 24/7). ? This guidance is subject to change. For the most up-to-date guidance from [facility], please refer to their website: YouBlogs.pl

## 2019-04-08 NOTE — Telephone Encounter (Signed)
Physician requested Covid -19 testing testing.  Called Patient  and scheduled at 04/09/19 @  09:00 Patient aware to put mask on and sty In Car.

## 2019-04-08 NOTE — Progress Notes (Signed)
Patient: Julia Robertson Female    DOB: 1956/11/01   63 y.o.   MRN: [id number] Visit Date: 04/08/2019  Today's Provider: Mar Daring, PA-C   Chief Complaint  Patient presents with  . Loss of Consciousness   Subjective:     Loss of Consciousness  This is a new problem. The current episode started today. She lost consciousness for a period of less than 1 minute. Nothing (patient rports that before it hit her she started to feel lightheaded-feels related to Mucinex) aggravates the symptoms. Pertinent negatives include no chest pain, confusion, dizziness, headaches, light-headedness, palpitations, slurred speech, visual change or weakness. Associated symptoms comments: Reports that her taste buds are gone since yesterday evening. She also report that she had a headache yesterday.. She has tried nothing for the symptoms. Her past medical history is significant for DM and HTN.   Reports that she has been tested for COVID-19 twice and both time it has been negative. Works at [employer] where there has been an [place] of Covid-19. Last time she was tested was last Wednesday (03/31/19).   Patient reports that the Trulicity and ozempic run out a month ago.  No Known Allergies   Current Outpatient Medications:  .  amLODipine (NORVASC) 10 MG tablet, Take 1 tablet (10 mg total) by mouth daily., Disp: 90 tablet, Rfl: 0 .  cyclobenzaprine (FLEXERIL) 10 MG tablet, Take 1 tablet (10 mg total) by mouth 2 (two) times daily as needed for muscle spasms., Disp: 30 tablet, Rfl: 0 .  gabapentin (NEURONTIN) 300 MG capsule, Take 1 capsule by mouth at bedtime, Disp: 90 capsule, Rfl: 0 .  glucose blood test strip, Use as instructed, Disp: 100 each, Rfl: 12 .  lisinopril-hydrochlorothiazide (PRINZIDE,ZESTORETIC) 20-25 MG tablet, Take 1 tablet by mouth daily., Disp: 90 tablet, Rfl: 0 .  meloxicam (MOBIC) 7.5 MG tablet, Take 1 tablet (7.5 mg total) by mouth daily., Disp: 30 tablet,  Rfl: 0 .  metFORMIN (GLUCOPHAGE) 1000 MG tablet, Take 1 tablet (1,000 mg total) by mouth 2 (two) times daily with a meal., Disp: 60 tablet, Rfl: 5 .  Multiple Vitamin (MULTIVITAMIN) capsule, Take 1 capsule by mouth daily., Disp: , Rfl:  .  traMADol (ULTRAM) 50 MG tablet, Take 1 tablet (50 mg total) by mouth every 8 (eight) hours as needed for severe pain., Disp: 15 tablet, Rfl: 0 .  traZODone (DESYREL) 100 MG tablet, Take 1 tablet (100 mg total) by mouth at bedtime., Disp: 30 tablet, Rfl: 5 .  Dulaglutide (TRULICITY) 1.5 TK/3.5WS SOPN, Inject 1.5 mg into the skin once a week. (Patient not taking: Reported on 04/08/2019), Disp: 4 pen, Rfl: 0 .  Semaglutide, 1 MG/DOSE, (OZEMPIC, 1 MG/DOSE,) 2 MG/1.5ML SOPN, Inject 1 mg into the skin once a week. PATIENT IS DUE FOR FOLLOW UP OFFICE VISIT (Patient not taking: Reported on 04/08/2019), Disp: 6 pen, Rfl: 0  Review of Systems  Constitutional: Negative.   HENT: Negative.   Respiratory: Negative.   Cardiovascular: Positive for syncope. Negative for chest pain, palpitations and leg swelling.  Gastrointestinal: Negative.   Endocrine: Negative.   Neurological: Positive for syncope. Negative for dizziness, weakness, light-headedness, numbness and headaches.  Psychiatric/Behavioral: Negative for confusion.    Social History   Tobacco Use  . Smoking status: Never Smoker  . Smokeless tobacco: Never Used  Substance Use Topics  . Alcohol use: No      Objective:   Temp 98.5 F (36.9 C) (  Oral)   Resp 16   Ht 5' 4.5" (1.638 m)   Wt 234 lb 3.2 oz (106.2 kg)   BMI 39.58 kg/m  Vitals:   04/08/19 1518  Resp: 16  Temp: 98.5 F (36.9 C)  TempSrc: Oral  Weight: 234 lb 3.2 oz (106.2 kg)  Height: 5' 4.5" (1.638 m)     Physical Exam Vitals signs reviewed.  Constitutional:      General: She is not in acute distress.    Appearance: Normal appearance. She is well-developed. She is obese. She is not ill-appearing or diaphoretic.  HENT:     Head:  Normocephalic and atraumatic.     Right Ear: Hearing, tympanic membrane, ear canal and external ear normal.     Left Ear: Hearing, tympanic membrane, ear canal and external ear normal.     Nose: Nose normal.     Mouth/Throat:     Mouth: Mucous membranes are dry.     Pharynx: Uvula midline. No oropharyngeal exudate.  Eyes:     General: No scleral icterus.       Right eye: No discharge.        Left eye: No discharge.     Conjunctiva/sclera: Conjunctivae normal.     Pupils: Pupils are equal, round, and reactive to light.  Neck:     Musculoskeletal: Normal range of motion and neck supple.     Thyroid: No thyromegaly.     Trachea: No tracheal deviation.  Cardiovascular:     Rate and Rhythm: Normal rate and regular rhythm.     Heart sounds: Normal heart sounds. No murmur. No friction rub. No gallop.   Pulmonary:     Effort: Pulmonary effort is normal. No respiratory distress.     Breath sounds: Normal breath sounds. No stridor. No wheezing or rales.  Lymphadenopathy:     Cervical: No cervical adenopathy.  Skin:    General: Skin is warm and dry.  Neurological:     General: No focal deficit present.     Mental Status: She is alert and oriented to person, place, and time. Mental status is at baseline.     Cranial Nerves: No cranial nerve deficit.     Motor: No weakness.     Coordination: Coordination normal.     Gait: Gait normal.  Psychiatric:        Mood and Affect: Mood normal.        Behavior: Behavior normal.        Thought Content: Thought content normal.        Judgment: Judgment normal.         Assessment & Plan    1. Syncope, unspecified syncope type EKG unremarkable and orthostatic BP was normal. Suspect syncopal episode may be multifactorial and related to patient being diabetic and have not eaten all day, little liquid intake and working in a mask. Patient had slight prodrome of noticing some dizziness and tunneling prior to syncopal episode. Plus patient was able to  sit down before passing out and never fell out of the chair. She also did not have a post-ictal state indicating possible seizure activity. I am also worried of her being exposed to Covid 19 through her work place and this may also be causing some exacerbating factors that lead to the syncopal episode. She will be referred to the offsite testing facility for further testing. She will also be included in the symptom screening. Advised her to go straight home and begin isolated quarantine. She was  accompanied by a gentleman today in the office and he also should be tested.  - EKG 12-Lead  2. Class 2 severe obesity due to excess calories with serious comorbidity and body mass index (BMI) of 39.0 to 39.9 in adult [facility]) Counseled patient on healthy lifestyle modifications including dieting and exercise.   3. Loss of taste See above medical treatment plan for #1.  4. Nasal congestion See above medical treatment plan for #1.  5. Close Exposure to Covid-19 Virus Exposed at work, [employer] (known outbreak facility).   6. Type 2 diabetes mellitus without complication, without long-term current use of insulin (HCC) Continue current medications.   7. Hypertension associated with diabetes ([place]) Orthostatics normal. Continue medications as prescribed.      Mar Daring, PA-C  [facility]

## 2019-04-08 NOTE — Telephone Encounter (Signed)
Patient  Reports that at 92 Am this morning while sitting at her desk she had a syncope episode that was under 1 minute. Patient states that she has had allergy like symptoms and had taken Mucinex, while working she began feeling lightheaded and decided to sit at her desk when she states she went unconcisous. Patient denied hitting her head or falling to the floor. Patient denied nausea, vomiting, disorientation, blurred vision or difficulty with speech. Patient states that she was concerned that her blood pressure had dropped from taking medication late last night and states that she had not eaten prior to this. Vitals were taken at work blood pressure standing was 146/88 and sitting 101/58, temperature 98.3, heart rate 85 and blood sugar was 135 ( fasting). Patient states that she is at home now and is feeling better now that she has eaten. Patient states that she did not feel need to go to ED and would like to keep appt at [facility]. Please advise. KW

## 2019-04-09 ENCOUNTER — Other Ambulatory Visit: Payer: BLUE CROSS/BLUE SHIELD

## 2019-04-09 DIAGNOSIS — R6889 Other general symptoms and signs: Secondary | ICD-10-CM | POA: Diagnosis not present

## 2019-04-09 DIAGNOSIS — Z20822 Contact with and (suspected) exposure to covid-19: Secondary | ICD-10-CM

## 2019-04-09 LAB — NOVEL CORONAVIRUS, NAA: SARS-CoV-2, NAA: DETECTED

## 2019-04-12 ENCOUNTER — Telehealth (HOSPITAL_COMMUNITY): Payer: Self-pay | Admitting: Internal Medicine

## 2019-04-12 LAB — NOVEL CORONAVIRUS, NAA: SARS-CoV-2, NAA: DETECTED — AB

## 2019-04-12 NOTE — Telephone Encounter (Signed)
I was notified by [facility] that patient's Covid test result from 04/09/2019 was positive.  Spoke with patient by phone.  She says she has had a mild cough and no fever.  Advised her to stay home for at least 10 days since symptom onset and until no fever for 3 days in a row and all respiratory symptoms are improving. Patient asked if there was an antibiotic or any treatment for this illness and I advised her there is not.  Health department will be in contact with further guidance.  LM

## 2019-06-08 ENCOUNTER — Other Ambulatory Visit: Payer: Self-pay | Admitting: Physician Assistant

## 2019-06-08 DIAGNOSIS — E1159 Type 2 diabetes mellitus with other circulatory complications: Secondary | ICD-10-CM

## 2019-06-08 DIAGNOSIS — G4700 Insomnia, unspecified: Secondary | ICD-10-CM

## 2019-06-08 DIAGNOSIS — G6289 Other specified polyneuropathies: Secondary | ICD-10-CM

## 2019-06-08 DIAGNOSIS — E119 Type 2 diabetes mellitus without complications: Secondary | ICD-10-CM

## 2019-06-08 DIAGNOSIS — I152 Hypertension secondary to endocrine disorders: Secondary | ICD-10-CM

## 2019-06-08 MED ORDER — TRAZODONE HCL 100 MG PO TABS: 100.0000 mg | ORAL_TABLET | Freq: Every day | ORAL | 5 refills | Status: DC

## 2019-06-08 MED ORDER — LISINOPRIL-HYDROCHLOROTHIAZIDE 20-25 MG PO TABS: 1.0000 | ORAL_TABLET | Freq: Every day | ORAL | 0 refills | Status: DC

## 2019-06-08 MED ORDER — GABAPENTIN 300 MG PO CAPS: 300.0000 mg | ORAL_CAPSULE | Freq: Every day | ORAL | 0 refills | Status: DC

## 2019-06-08 MED ORDER — METFORMIN HCL 1000 MG PO TABS: 1000.0000 mg | ORAL_TABLET | Freq: Two times a day (BID) | ORAL | 5 refills | Status: DC

## 2019-06-08 MED ORDER — AMLODIPINE BESYLATE 10 MG PO TABS: 10.0000 mg | ORAL_TABLET | Freq: Every day | ORAL | 0 refills | Status: DC

## 2019-06-08 NOTE — Telephone Encounter (Signed)
Pt contacted office for refill request on the following medications:  1. amLODipine (NORVASC) 10 MG tablet 2. gabapentin (NEURONTIN) 300 MG capsule 3. lisinopril-hydrochlorothiazide (PRINZIDE,ZESTORETIC) 20-25 MG tablet 4. metFORMIN (GLUCOPHAGE) 1000 MG tablet 5. traZODone (DESYREL) 100 MG tablet   [facility] [place]  Please advise. Thanks TNP

## 2019-06-15 ENCOUNTER — Ambulatory Visit: Payer: BLUE CROSS/BLUE SHIELD | Admitting: Physician Assistant

## 2019-07-01 ENCOUNTER — Other Ambulatory Visit: Payer: Self-pay

## 2019-07-01 ENCOUNTER — Ambulatory Visit: Payer: BC Managed Care – PPO | Admitting: Physician Assistant

## 2019-07-01 ENCOUNTER — Encounter: Payer: Self-pay | Admitting: Physician Assistant

## 2019-07-01 VITALS — BP 106/73 | HR 79 | Temp 98.2°F | Resp 16 | Ht 65.0 in | Wt 244.0 lb

## 2019-07-01 DIAGNOSIS — E119 Type 2 diabetes mellitus without complications: Secondary | ICD-10-CM

## 2019-07-01 DIAGNOSIS — E1159 Type 2 diabetes mellitus with other circulatory complications: Secondary | ICD-10-CM | POA: Diagnosis not present

## 2019-07-01 DIAGNOSIS — E1169 Type 2 diabetes mellitus with other specified complication: Secondary | ICD-10-CM

## 2019-07-01 DIAGNOSIS — I10 Essential (primary) hypertension: Secondary | ICD-10-CM | POA: Diagnosis not present

## 2019-07-01 DIAGNOSIS — I152 Hypertension secondary to endocrine disorders: Secondary | ICD-10-CM

## 2019-07-01 DIAGNOSIS — E785 Hyperlipidemia, unspecified: Secondary | ICD-10-CM

## 2019-07-01 LAB — POCT GLYCOSYLATED HEMOGLOBIN (HGB A1C)
Est. average glucose Bld gHb Est-mCnc: 143
Hemoglobin A1C: 6.6 % — AB (ref 4.0–5.6)

## 2019-07-01 MED ORDER — TRULICITY 1.5 MG/0.5ML ~~LOC~~ SOAJ: 1.5000 mg | SUBCUTANEOUS | 0 refills | Status: DC

## 2019-07-01 NOTE — Progress Notes (Signed)
Patient: Julia Robertson Female    DOB: May 08, 1956   63 y.o.   MRN: [id number] Visit Date: 07/01/2019  Today's Provider: Trinna Post, PA-C   Chief Complaint  Patient presents with  . Follow-up  . Diabetes   Subjective:     HPI   Type 2 diabetes mellitus without complication, without long-term current use of insulin ([place]) From 04/08/2019-Continue current medications. Has been eating banana splits and binging. Not taking medications. Still has two pens of trulicity from a prescription she filled 3 months ago. She has been through nutrition counseling and CCM counseling.   Lab Results  Component Value Date   HGBA1C 11.1 (A) 10/30/2018    Hypertension associated with diabetes ([place]) From 04/08/2019-Orthostatics normal. Continue medications as prescribed.  HLD: not on statin.  Lipid Panel     Component Value Date/Time   CHOL 207 (H) 07/01/2019 1129   TRIG 89 07/01/2019 1129   HDL 54 07/01/2019 1129   CHOLHDL 3.8 07/01/2019 1129   LDLCALC 135 (H) 07/01/2019 1129   CMP Latest Ref Rng & Units 07/01/2019 11/06/2018 09/30/2018  Glucose 65 - 99 mg/dL 101(H) 155(H) 289(H)  BUN 8 - 27 mg/dL 16 8 12   Creatinine 0.57 - 1.00 mg/dL 0.75 0.70 0.71  Sodium 134 - 144 mmol/L 138 138 136  Potassium 3.5 - 5.2 mmol/L 4.1 3.8 3.7  Chloride 96 - 106 mmol/L 98 102 98  CO2 20 - 29 mmol/L 23 22 22   Calcium 8.7 - 10.3 mg/dL 9.7 9.4 9.4  Total Protein 6.0 - 8.5 g/dL 7.9 7.5 7.7  Total Bilirubin 0.0 - 1.2 mg/dL 0.3 0.5 0.5  Alkaline Phos 39 - 117 IU/L 65 78 79  AST 0 - 40 IU/L 17 49(H) 69(H)  ALT 0 - 32 IU/L 17 50(H) 56(H)     Allergies  Allergen Reactions  . Mucinex [Guaifenesin Er] Other (See Comments)    syncope     Current Outpatient Medications:  .  amLODipine (NORVASC) 10 MG tablet, Take 1 tablet (10 mg total) by mouth daily., Disp: 90 tablet, Rfl: 0 .  cyclobenzaprine (FLEXERIL) 10 MG tablet, Take 1 tablet (10 mg total) by mouth 2 (two) times daily as needed for  muscle spasms., Disp: 30 tablet, Rfl: 0 .  gabapentin (NEURONTIN) 300 MG capsule, Take 1 capsule (300 mg total) by mouth at bedtime., Disp: 90 capsule, Rfl: 0 .  glucose blood test strip, Use as instructed, Disp: 100 each, Rfl: 12 .  lisinopril-hydrochlorothiazide (ZESTORETIC) 20-25 MG tablet, Take 1 tablet by mouth daily., Disp: 90 tablet, Rfl: 0 .  meloxicam (MOBIC) 7.5 MG tablet, Take 1 tablet (7.5 mg total) by mouth daily., Disp: 30 tablet, Rfl: 0 .  metFORMIN (GLUCOPHAGE) 1000 MG tablet, Take 1 tablet (1,000 mg total) by mouth 2 (two) times daily with a meal., Disp: 60 tablet, Rfl: 5 .  Multiple Vitamin (MULTIVITAMIN) capsule, Take 1 capsule by mouth daily., Disp: , Rfl:  .  traMADol (ULTRAM) 50 MG tablet, Take 1 tablet (50 mg total) by mouth every 8 (eight) hours as needed for severe pain., Disp: 15 tablet, Rfl: 0 .  traZODone (DESYREL) 100 MG tablet, Take 1 tablet (100 mg total) by mouth at bedtime., Disp: 30 tablet, Rfl: 5  Review of Systems  Constitutional: Negative for appetite change, chills, fatigue and fever.  Respiratory: Negative for chest tightness and shortness of breath.   Cardiovascular: Negative for chest pain and palpitations.  Gastrointestinal: Negative for  abdominal pain, nausea and vomiting.  Neurological: Negative for dizziness and weakness.    Social History   Tobacco Use  . Smoking status: Never Smoker  . Smokeless tobacco: Never Used  Substance Use Topics  . Alcohol use: No      Objective:   BP 106/73 (BP Location: Left Arm, Patient Position: Sitting, Cuff Size: Large)   Pulse 79   Temp 98.2 F (36.8 C) (Oral)   Resp 16   Ht 5\' 5"  (1.651 m)   Wt 244 lb (110.7 kg)   SpO2 97%   BMI 40.60 kg/m  Vitals:   07/01/19 1047  BP: 106/73  Pulse: 79  Resp: 16  Temp: 98.2 F (36.8 C)  TempSrc: Oral  SpO2: 97%  Weight: 244 lb (110.7 kg)  Height: 5\' 5"  (1.651 m)     Physical Exam Constitutional:      Appearance: Normal appearance.  Cardiovascular:       Rate and Rhythm: Normal rate and regular rhythm.     Heart sounds: Normal heart sounds.  Pulmonary:     Effort: Pulmonary effort is normal.     Breath sounds: Normal breath sounds.  Skin:    General: Skin is warm and dry.  Neurological:     Mental Status: She is alert and oriented to person, place, and time. Mental status is at baseline.  Psychiatric:        Mood and Affect: Mood normal.        Behavior: Behavior normal.      No results found for any visits on 07/01/19.     Assessment & Plan    1. Type 2 diabetes mellitus without complication, without long-term current use of insulin (HCC)  Her A1C is surprisingly controlled today. I have counseled her that she should not take this as a license to eat banana splits for every meal as she is doing not. And she needs to take her medication. Reminded her she needs to get her yearly eye exam.   - POCT glycosylated hemoglobin (Hb A1C) - Comprehensive Metabolic Panel (CMET) - Lipid Profile - Dulaglutide (TRULICITY) 1.5 ZJ/6.9CV SOPN; Inject 1.5 mg into the skin once a week.  Dispense: 12 pen; Refill: 0  2. Hypertension associated with diabetes ([place])  Controlled, continue current medications.   3. Hyperlipidemia associated with type 2 diabetes mellitus (HCC)  Start 10 mg lipitor, liver enzymes have resolved and her LDL is above goal.  The entirety of the information documented in the History of Present Illness, Review of Systems and Physical Exam were personally obtained by me. Portions of this information were initially documented by April M. Sabra Heck, CMA and reviewed by me for thoroughness and accuracy.   F/u 3 months DM II      Trinna Post, PA-C  [facility]

## 2019-07-01 NOTE — Patient Instructions (Signed)
Please call [facility]   Diabetes Mellitus and Exercise Exercising regularly is important for your overall health, especially when you have diabetes (diabetes mellitus). Exercising is not only about losing weight. It has many other health benefits, such as increasing muscle strength and bone density and reducing body fat and stress. This leads to improved fitness, flexibility, and endurance, all of which result in better overall health. Exercise has additional benefits for people with diabetes, including:  Reducing appetite.  Helping to lower and control blood glucose.  Lowering blood pressure.  Helping to control amounts of fatty substances (lipids) in the blood, such as cholesterol and triglycerides.  Helping the body to respond better to insulin (improving insulin sensitivity).  Reducing how much insulin the body needs.  Decreasing the risk for heart disease by: ? Lowering cholesterol and triglyceride levels. ? Increasing the levels of good cholesterol. ? Lowering blood glucose levels. What is my activity plan? Your health care provider or certified diabetes educator can help you make a plan for the type and frequency of exercise (activity plan) that works for you. Make sure that you:  Do at least 150 minutes of moderate-intensity or vigorous-intensity exercise each week. This could be brisk walking, biking, or water aerobics. ? Do stretching and strength exercises, such as yoga or weightlifting, at least 2 times a week. ? Spread out your activity over at least 3 days of the week.  Get some form of physical activity every day. ? Do not go more than 2 days in a row without some kind of physical activity. ? Avoid being inactive for more than 30 minutes at a time. Take frequent breaks to walk or stretch.  Choose a type of exercise or activity that you enjoy, and set realistic goals.  Start slowly, and gradually increase the intensity of your exercise over time. What do I  need to know about managing my diabetes?   Check your blood glucose before and after exercising. ? If your blood glucose is 240 mg/dL (13.3 mmol/L) or higher before you exercise, check your urine for ketones. If you have ketones in your urine, do not exercise until your blood glucose returns to normal. ? If your blood glucose is 100 mg/dL (5.6 mmol/L) or lower, eat a snack containing 15-20 grams of carbohydrate. Check your blood glucose 15 minutes after the snack to make sure that your level is above 100 mg/dL (5.6 mmol/L) before you start your exercise.  Know the symptoms of low blood glucose (hypoglycemia) and how to treat it. Your risk for hypoglycemia increases during and after exercise. Common symptoms of hypoglycemia can include: ? Hunger. ? Anxiety. ? Sweating and feeling clammy. ? Confusion. ? Dizziness or feeling light-headed. ? Increased heart rate or palpitations. ? Blurry vision. ? Tingling or numbness around the mouth, lips, or tongue. ? Tremors or shakes. ? Irritability.  Keep a rapid-acting carbohydrate snack available before, during, and after exercise to help prevent or treat hypoglycemia.  Avoid injecting insulin into areas of the body that are going to be exercised. For example, avoid injecting insulin into: ? The arms, when playing tennis. ? The legs, when jogging.  Keep records of your exercise habits. Doing this can help you and your health care provider adjust your diabetes management plan as needed. Write down: ? Food that you eat before and after you exercise. ? Blood glucose levels before and after you exercise. ? The type and amount of exercise you have done. ? When your insulin  is expected to peak, if you use insulin. Avoid exercising at times when your insulin is peaking.  When you start a new exercise or activity, work with your health care provider to make sure the activity is safe for you, and to adjust your insulin, medicines, or food intake as needed.   Drink plenty of water while you exercise to prevent dehydration or heat stroke. Drink enough fluid to keep your urine clear or pale yellow. Summary  Exercising regularly is important for your overall health, especially when you have diabetes (diabetes mellitus).  Exercising has many health benefits, such as increasing muscle strength and bone density and reducing body fat and stress.  Your health care provider or certified diabetes educator can help you make a plan for the type and frequency of exercise (activity plan) that works for you.  When you start a new exercise or activity, work with your health care provider to make sure the activity is safe for you, and to adjust your insulin, medicines, or food intake as needed. This information is not intended to replace advice given to you by your health care provider. Make sure you discuss any questions you have with your health care provider. Document Released: 02/01/2004 Document Revised: 06/05/2017 Document Reviewed: 04/22/2016 Elsevier Patient Education  2020 [employer].

## 2019-07-02 ENCOUNTER — Telehealth: Payer: Self-pay

## 2019-07-02 LAB — COMPREHENSIVE METABOLIC PANEL
ALT: 17 IU/L (ref 0–32)
AST: 17 IU/L (ref 0–40)
Albumin/Globulin Ratio: 1.1 — ABNORMAL LOW (ref 1.2–2.2)
Albumin: 4.1 g/dL (ref 3.8–4.9)
Alkaline Phosphatase: 65 IU/L (ref 39–117)
BUN/Creatinine Ratio: 21 (ref 12–28)
BUN: 16 mg/dL (ref 8–27)
Bilirubin Total: 0.3 mg/dL (ref 0.0–1.2)
CO2: 23 mmol/L (ref 20–29)
Calcium: 9.7 mg/dL (ref 8.7–10.3)
Chloride: 98 mmol/L (ref 96–106)
Creatinine, Ser: 0.75 mg/dL (ref 0.57–1.00)
GFR calc Af Amer: 100 mL/min/{1.73_m2} (ref >59)
GFR calc non Af Amer: 87 mL/min/{1.73_m2} (ref >59)
Globulin, Total: 3.8 g/dL (ref 1.5–4.5)
Glucose: 101 mg/dL — ABNORMAL HIGH (ref 65–99)
Potassium: 4.1 mmol/L (ref 3.5–5.2)
Sodium: 138 mmol/L (ref 134–144)
Total Protein: 7.9 g/dL (ref 6.0–8.5)

## 2019-07-02 LAB — LIPID PANEL
Chol/HDL Ratio: 3.8 ratio (ref 0.0–4.4)
Cholesterol, Total: 207 mg/dL — ABNORMAL HIGH (ref 100–199)
HDL: 54 mg/dL (ref >39)
LDL Calculated: 135 mg/dL — ABNORMAL HIGH (ref 0–99)
Triglycerides: 89 mg/dL (ref 0–149)
VLDL Cholesterol Cal: 18 mg/dL (ref 5–40)

## 2019-07-02 MED ORDER — ATORVASTATIN CALCIUM 10 MG PO TABS: 10.0000 mg | ORAL_TABLET | Freq: Every day | ORAL | 1 refills | Status: DC

## 2019-07-02 NOTE — Telephone Encounter (Signed)
-----   Message from Trinna Post, [place] sent at 07/02/2019  9:43 AM EDT ----- Her liver enzymes have normalized. Her cholesterol is to ohigh and she needs her cholesterol medication back. Please send in Lipitor 10 mg qhs #90 with one refill and patient should take this.

## 2019-07-02 NOTE — Telephone Encounter (Signed)
Patient advised as below. Patient agreed to restart Lipitor.

## 2019-07-02 NOTE — Telephone Encounter (Signed)
LMTCB

## 2019-07-12 DIAGNOSIS — E1169 Type 2 diabetes mellitus with other specified complication: Secondary | ICD-10-CM | POA: Insufficient documentation

## 2019-08-31 ENCOUNTER — Telehealth: Payer: Self-pay | Admitting: Physician Assistant

## 2019-08-31 DIAGNOSIS — L600 Ingrowing nail: Secondary | ICD-10-CM

## 2019-08-31 NOTE — Telephone Encounter (Signed)
Pt called wanting to be referred to a podiatrist for ingrown toenail and neuropathy  CB#  [phone]   She said she had talked to [place] about this before, and has seen her about it also  Con [place]

## 2019-09-01 NOTE — Telephone Encounter (Signed)
Referral placed.

## 2019-09-17 ENCOUNTER — Other Ambulatory Visit: Payer: Self-pay

## 2019-09-17 ENCOUNTER — Ambulatory Visit: Payer: BLUE CROSS/BLUE SHIELD | Admitting: Podiatry

## 2019-09-17 DIAGNOSIS — B351 Tinea unguium: Secondary | ICD-10-CM | POA: Diagnosis not present

## 2019-09-17 DIAGNOSIS — M79676 Pain in unspecified toe(s): Secondary | ICD-10-CM | POA: Diagnosis not present

## 2019-09-17 DIAGNOSIS — E0843 Diabetes mellitus due to underlying condition with diabetic autonomic (poly)neuropathy: Secondary | ICD-10-CM | POA: Diagnosis not present

## 2019-09-17 DIAGNOSIS — L989 Disorder of the skin and subcutaneous tissue, unspecified: Secondary | ICD-10-CM

## 2019-09-20 ENCOUNTER — Other Ambulatory Visit: Payer: Self-pay | Admitting: Physician Assistant

## 2019-09-20 DIAGNOSIS — G8929 Other chronic pain: Secondary | ICD-10-CM

## 2019-09-20 DIAGNOSIS — G6289 Other specified polyneuropathies: Secondary | ICD-10-CM

## 2019-09-20 MED ORDER — GABAPENTIN 300 MG PO CAPS: 300.0000 mg | ORAL_CAPSULE | Freq: Every day | ORAL | 0 refills | Status: DC

## 2019-09-20 MED ORDER — TRAMADOL HCL 50 MG PO TABS: 50.0000 mg | ORAL_TABLET | Freq: Three times a day (TID) | ORAL | 0 refills | Status: DC | PRN

## 2019-09-20 NOTE — Telephone Encounter (Signed)
Pt needs a refill on  Tramadol 50 mg  Gabapentin 300 mg  [place]

## 2019-09-20 NOTE — Telephone Encounter (Signed)
L.O.V. was on 07/01/2019 and upcoming appointment is 11/02/2019.

## 2019-09-20 NOTE — Progress Notes (Signed)
    Subjective: Patient is a 63 y.o. female presenting to the office today as a new patient with a chief complaint of painful callus lesion(s) noted to the bilateral feet that have been present for the past several months. Walking and bearing weight increases the pain. She has not had any treatment for the symptoms.  Patient also complains of elongated, thickened nails that cause pain while ambulating in shoes. She is unable to trim her own nails. Patient presents today for further treatment and evaluation.  Past Medical History:  Diagnosis Date  . Diabetes mellitus without complication ([place])   . Hypertension   . Neuromuscular disorder ([place])   . Obesity     Objective:  Physical Exam General: Alert and oriented x3 in no acute distress  Dermatology: Hyperkeratotic lesion(s) present on the bilateral feet. Pain on palpation with a central nucleated core noted. Skin is warm, dry and supple bilateral lower extremities. Negative for open lesions or macerations. Nails are tender, long, thickened and dystrophic with subungual debris, consistent with onychomycosis, 1-5 bilateral. No signs of infection noted.  Vascular: Palpable pedal pulses bilaterally. No edema or erythema noted. Capillary refill within normal limits.  Neurological: Epicritic and protective threshold diminished bilaterally.   Musculoskeletal Exam: Pain on palpation at the keratotic lesion(s) noted. Range of motion within normal limits bilateral. Muscle strength 5/5 in all groups bilateral.  Assessment: 1. Onychodystrophic nails 1-5 bilateral with hyperkeratosis of nails.  2. Onychomycosis of nail due to dermatophyte bilateral 3. Pre-ulcerative callus lesions noted to the bilateral feet x 2   Plan of Care:  1. Patient evaluated. 2. Excisional debridement of keratoic lesion(s) using a chisel blade was performed without incident.  3. Dressed with light dressing. 4. Mechanical debridement of nails 1-5 bilaterally performed  using a nail nipper. Filed with dremel without incident.  5. Patient is to return to the clinic in 3 months.   Edrick Kins, DPM [facility]  Dr. Edrick Kins, [address]                                        [place], [place] [address]                Office [phone]  Fax [phone]

## 2019-11-02 ENCOUNTER — Ambulatory Visit: Payer: BC Managed Care – PPO | Admitting: Physician Assistant

## 2019-11-22 ENCOUNTER — Other Ambulatory Visit: Payer: Self-pay | Admitting: Physician Assistant

## 2019-11-22 DIAGNOSIS — I152 Hypertension secondary to endocrine disorders: Secondary | ICD-10-CM

## 2019-11-22 DIAGNOSIS — G8929 Other chronic pain: Secondary | ICD-10-CM

## 2019-11-22 DIAGNOSIS — E119 Type 2 diabetes mellitus without complications: Secondary | ICD-10-CM

## 2019-11-22 DIAGNOSIS — E1159 Type 2 diabetes mellitus with other circulatory complications: Secondary | ICD-10-CM

## 2019-11-22 DIAGNOSIS — M25561 Pain in right knee: Secondary | ICD-10-CM

## 2019-11-22 NOTE — Telephone Encounter (Signed)
Requested medication (s) are due for refill today: yes  Requested medication (s) are on the active medication list: yes  Last refill: 09/20/2019  Future visit scheduled: yes  Notes to clinic:  This refill cannot be delegated    Requested Prescriptions  Pending Prescriptions Disp Refills   traMADol (ULTRAM) 50 MG tablet [Pharmacy Med Name: traMADol HCl 50 MG Oral Tablet] 15 tablet 0    Sig: TAKE 1 TABLET BY MOUTH EVERY 8 HOURS AS NEEDED FOR SEVERE PAIN      Not Delegated - Analgesics:  Opioid Agonists Failed - 11/22/2019  3:15 PM      Failed - This refill cannot be delegated      Failed - Urine Drug Screen completed in last 360 days.      Passed - Valid encounter within last 6 months    Recent Outpatient Visits           4 months ago Type 2 diabetes mellitus without complication, without long-term current use of insulin [facility])   [place], [place], [place]   7 months ago Syncope, unspecified syncope type   [facility] Fenton Malling M, [place]   11 months ago Hypertension associated with diabetes [facility])   [facility] [place], Fabio Bering M, [place]   1 year ago Muscle strain   [place], [place], [place]   1 year ago Type 2 diabetes mellitus without complication, without long-term current use of insulin [facility])   [place], Wendee Beavers, [place]       Future Appointments             In 3 weeks Trinna Post, PA-C [employer], [facility]             Signed Prescriptions Disp Refills   amLODipine (NORVASC) 10 MG tablet 90 tablet 0    Sig: Take 1 tablet by mouth once daily      Cardiovascular:  Calcium Channel Blockers Passed - 11/22/2019  3:15 PM      Passed - Last BP in normal range    BP Readings from Last 1 Encounters:  07/01/19 106/73          Passed - Valid encounter within last 6 months    Recent Outpatient Visits           4 months ago Type 2 diabetes  mellitus without complication, without long-term current use of insulin [facility])   [place], [place], PA-C   7 months ago Syncope, unspecified syncope type   [facility] Fenton Malling M, [place]   11 months ago Hypertension associated with diabetes [facility])   [facility] [place], [place], [place]   1 year ago Muscle strain   [facility] [place], [place], [place]   1 year ago Type 2 diabetes mellitus without complication, without long-term current use of insulin [facility])   [facility] [place], [place], [place]       Future Appointments             In 3 weeks Trinna Post, PA-C [employer], [facility]              lisinopril-hydrochlorothiazide (ZESTORETIC) 20-25 MG tablet 90 tablet 0    Sig: Take 1 tablet by mouth once daily      Cardiovascular:  ACEI + Diuretic Combos Passed - 11/22/2019  3:15 PM      Passed - Na in  normal range and within 180 days    Sodium  Date Value Ref Range Status  07/01/2019 138 134 - 144 mmol/L Final  09/23/2012 138 136 - 145 mmol/L Final          Passed - K in normal range and within 180 days    Potassium  Date Value Ref Range Status  07/01/2019 4.1 3.5 - 5.2 mmol/L Final  09/23/2012 4.1 3.5 - 5.1 mmol/L Final          Passed - Cr in normal range and within 180 days    Creatinine  Date Value Ref Range Status  09/23/2012 0.92 0.60 - 1.30 mg/dL Final   Creatinine, Ser  Date Value Ref Range Status  07/01/2019 0.75 0.57 - 1.00 mg/dL Final          Passed - Ca in normal range and within 180 days    Calcium  Date Value Ref Range Status  07/01/2019 9.7 8.7 - 10.3 mg/dL Final   Calcium, Total  Date Value Ref Range Status  09/23/2012 8.8 8.5 - 10.1 mg/dL Final          Passed - Patient is not pregnant      Passed - Last BP in normal range    BP Readings from Last 1 Encounters:  07/01/19 106/73          Passed - Valid encounter within last 6  months    Recent Outpatient Visits           4 months ago Type 2 diabetes mellitus without complication, without long-term current use of insulin [facility])   [place], [place], PA-C   7 months ago Syncope, unspecified syncope type   [facility] Fenton Malling M, [place]   11 months ago Hypertension associated with diabetes [facility])   [facility] [place], [place], [place]   1 year ago Muscle strain   [place], [place], [place]   1 year ago Type 2 diabetes mellitus without complication, without long-term current use of insulin [facility])   [place], Wendee Beavers, [place]       Future Appointments             In 3 weeks Trinna Post, PA-C [employer], [facility]

## 2019-11-23 NOTE — Telephone Encounter (Signed)
We will discuss refilling her tramadol if she keeps her upcoming appointment. Until then I am declining to refill it.

## 2019-12-13 ENCOUNTER — Ambulatory Visit: Payer: BLUE CROSS/BLUE SHIELD | Admitting: Podiatry

## 2019-12-14 NOTE — Progress Notes (Signed)
18/8      Patient: Julia Robertson Female    DOB: May 16, 1956   64 y.o.   MRN: [id number] Visit Date: 12/15/2019  Today's Provider: Trinna Post, PA-C   Chief Complaint  Patient presents with  . Diabetes Mellitus   Subjective:     HPI   Diabetes Mellitus Type II, Follow-up:   Lab Results  Component Value Date   HGBA1C 6.6 (A) 07/01/2019   HGBA1C 11.1 (A) 10/30/2018   HGBA1C 10.8 (H) 09/30/2018    Last seen for diabetes 5 months ago.  Management since then includes metformin 123XX123 mg BID, trulicity 1.5 mg subQ once weekly She reports good compliance with treatment. She is not having side effects.  Current symptoms include none and have been stable. Home blood sugar records: Morning fasting 70's and after eating breakfast 170's.  Episodes of hypoglycemia? no   Current insulin regiment: Trulicity 1.5 mg into the skin once a week. Most Recent Eye Exam: Not UTD Weight trend: stable Prior visit with dietician: No Current exercise: walking Current diet habits: well balanced  Pertinent Labs:    Component Value Date/Time   CHOL 207 (H) 07/01/2019 1129   TRIG 89 07/01/2019 1129   HDL 54 07/01/2019 1129   LDLCALC 135 (H) 07/01/2019 1129   CREATININE 0.75 07/01/2019 1129   CREATININE 0.92 09/23/2012 0547    Wt Readings from Last 3 Encounters:  12/15/19 245 lb 6.4 oz (111.3 kg)  07/01/19 244 lb (110.7 kg)  04/08/19 234 lb 3.2 oz (106.2 kg)   Reports wrist pain in her right arm that gets worse with grabbing motions. Has been happening x several weeks. Works in long term care facility.  ------------------------------------------------------------------------    Allergies  Allergen Reactions  . Mucinex [Guaifenesin Er] Other (See Comments)    syncope     Current Outpatient Medications:  .  amLODipine (NORVASC) 10 MG tablet, Take 1 tablet by mouth once daily, Disp: 90 tablet, Rfl: 0 .  atorvastatin (LIPITOR) 10 MG tablet, Take 1 tablet (10 mg total) by  mouth daily., Disp: 90 tablet, Rfl: 1 .  cyclobenzaprine (FLEXERIL) 10 MG tablet, Take 1 tablet (10 mg total) by mouth 2 (two) times daily as needed for muscle spasms., Disp: 30 tablet, Rfl: 0 .  Dulaglutide (TRULICITY) 1.5 0000000 SOPN, Inject 1.5 mg into the skin once a week., Disp: 12 pen, Rfl: 0 .  gabapentin (NEURONTIN) 300 MG capsule, Take 1 capsule (300 mg total) by mouth at bedtime., Disp: 90 capsule, Rfl: 0 .  glucose blood test strip, Use as instructed, Disp: 100 each, Rfl: 12 .  lisinopril-hydrochlorothiazide (ZESTORETIC) 20-25 MG tablet, Take 1 tablet by mouth once daily, Disp: 90 tablet, Rfl: 0 .  meloxicam (MOBIC) 7.5 MG tablet, Take 1 tablet (7.5 mg total) by mouth daily., Disp: 30 tablet, Rfl: 0 .  metFORMIN (GLUCOPHAGE) 1000 MG tablet, Take 1 tablet (1,000 mg total) by mouth 2 (two) times daily with a meal., Disp: 60 tablet, Rfl: 5 .  Multiple Vitamin (MULTIVITAMIN) capsule, Take 1 capsule by mouth daily., Disp: , Rfl:  .  traMADol (ULTRAM) 50 MG tablet, Take 1 tablet (50 mg total) by mouth every 8 (eight) hours as needed for severe pain., Disp: 15 tablet, Rfl: 0 .  traZODone (DESYREL) 100 MG tablet, Take 1 tablet (100 mg total) by mouth at bedtime., Disp: 30 tablet, Rfl: 5  Review of Systems  Social History   Tobacco Use  . Smoking status: Never Smoker  .  Smokeless tobacco: Never Used  Substance Use Topics  . Alcohol use: No      Objective:   BP 118/82 (BP Location: Left Arm, Patient Position: Sitting, Cuff Size: Normal)   Pulse 96   Temp (!) 96.8 F (36 C) (Temporal)   Wt 245 lb 6.4 oz (111.3 kg)   BMI 40.84 kg/m  Vitals:   12/15/19 1556  BP: 118/82  Pulse: 96  Temp: (!) 96.8 F (36 C)  TempSrc: Temporal  Weight: 245 lb 6.4 oz (111.3 kg)  Body mass index is 40.84 kg/m.   Physical Exam Constitutional:      Appearance: Normal appearance.  Cardiovascular:     Rate and Rhythm: Normal rate and regular rhythm.     Heart sounds: Normal heart sounds.    Pulmonary:     Effort: Pulmonary effort is normal.     Breath sounds: Normal breath sounds.  Musculoskeletal:     Right wrist: Normal.     Left wrist: Normal.  Skin:    General: Skin is warm and dry.  Neurological:     Mental Status: She is alert and oriented to person, place, and time. Mental status is at baseline.  Psychiatric:        Mood and Affect: Mood normal.        Behavior: Behavior normal.      No results found for any visits on 12/15/19.     Assessment & Plan    1. Type 2 diabetes mellitus without complication, without long-term current use of insulin (HCC)  - POCT glycosylated hemoglobin (Hb A1C) - Dulaglutide (TRULICITY) 1.5 0000000 SOPN; Inject 1.5 mg into the skin once a week.  Dispense: 12 pen; Refill: 0 - Ambulatory referral to Ophthalmology - Comprehensive Metabolic Panel (CMET) - CBC with Differential - Lipid Profile - atorvastatin (LIPITOR) 10 MG tablet; Take 1 tablet (10 mg total) by mouth daily.  Dispense: 90 tablet; Refill: 1  2. Chronic pain of right knee  - traMADol (ULTRAM) 50 MG tablet; Take 1 tablet (50 mg total) by mouth every 8 (eight) hours as needed for severe pain.  Dispense: 15 tablet; Refill: 0  3. Right wrist pain  - diclofenac Sodium (VOLTAREN) 1 % GEL; Apply 4 g topically 4 (four) times daily.  Dispense: 150 g; Refill: 0  4. Diabetes mellitus due to underlying condition with diabetic autonomic neuropathy, unspecified whether long term insulin use (HCC)   5. Class 2 severe obesity due to excess calories with serious comorbidity and body mass index (BMI) of 39.0 to 39.9 in adult ([place])   6. Hypercholesterolemia Elevated. Pharmacy faxed noncompliance with refills. Stressed importance of taking medications.    7. Hypertension associated with diabetes ([place]) Continue current medicines.   The entirety of the information documented in the History of Present Illness, Review of Systems and Physical Exam were personally obtained by me.  Portions of this information were initially documented by [facility] and reviewed by me for thoroughness and accuracy.        Trinna Post, PA-C  [facility]

## 2019-12-15 ENCOUNTER — Ambulatory Visit: Payer: BC Managed Care – PPO | Admitting: Physician Assistant

## 2019-12-15 ENCOUNTER — Encounter: Payer: Self-pay | Admitting: Physician Assistant

## 2019-12-15 ENCOUNTER — Other Ambulatory Visit: Payer: Self-pay

## 2019-12-15 VITALS — BP 118/82 | HR 96 | Temp 96.8°F | Wt 245.4 lb

## 2019-12-15 DIAGNOSIS — M25561 Pain in right knee: Secondary | ICD-10-CM

## 2019-12-15 DIAGNOSIS — E1159 Type 2 diabetes mellitus with other circulatory complications: Secondary | ICD-10-CM

## 2019-12-15 DIAGNOSIS — E119 Type 2 diabetes mellitus without complications: Secondary | ICD-10-CM

## 2019-12-15 DIAGNOSIS — I152 Hypertension secondary to endocrine disorders: Secondary | ICD-10-CM

## 2019-12-15 DIAGNOSIS — E7800 Pure hypercholesterolemia, unspecified: Secondary | ICD-10-CM

## 2019-12-15 DIAGNOSIS — G8929 Other chronic pain: Secondary | ICD-10-CM

## 2019-12-15 DIAGNOSIS — Z6839 Body mass index (BMI) 39.0-39.9, adult: Secondary | ICD-10-CM

## 2019-12-15 DIAGNOSIS — M25531 Pain in right wrist: Secondary | ICD-10-CM

## 2019-12-15 DIAGNOSIS — I10 Essential (primary) hypertension: Secondary | ICD-10-CM

## 2019-12-15 DIAGNOSIS — E0843 Diabetes mellitus due to underlying condition with diabetic autonomic (poly)neuropathy: Secondary | ICD-10-CM | POA: Diagnosis not present

## 2019-12-15 LAB — POCT GLYCOSYLATED HEMOGLOBIN (HGB A1C)
Est. average glucose Bld gHb Est-mCnc: 134
Hemoglobin A1C: 6.3 % — AB (ref 4.0–5.6)

## 2019-12-15 MED ORDER — TRULICITY 1.5 MG/0.5ML ~~LOC~~ SOAJ: 1.5000 mg | SUBCUTANEOUS | 0 refills | Status: DC

## 2019-12-15 MED ORDER — DICLOFENAC SODIUM 1 % EX GEL: 4.0000 g | Freq: Four times a day (QID) | CUTANEOUS | 0 refills | Status: DC

## 2019-12-15 MED ORDER — TRAMADOL HCL 50 MG PO TABS: 50.0000 mg | ORAL_TABLET | Freq: Three times a day (TID) | ORAL | 0 refills | Status: DC | PRN

## 2019-12-16 ENCOUNTER — Telehealth: Payer: Self-pay

## 2019-12-16 LAB — CBC WITH DIFFERENTIAL/PLATELET
Basophils Absolute: 0 10*3/uL (ref 0.0–0.2)
Basos: 1 %
EOS (ABSOLUTE): 0.1 10*3/uL (ref 0.0–0.4)
Eos: 2 %
Hematocrit: 40.5 % (ref 34.0–46.6)
Hemoglobin: 13.3 g/dL (ref 11.1–15.9)
Immature Grans (Abs): 0 10*3/uL (ref 0.0–0.1)
Immature Granulocytes: 0 %
Lymphocytes Absolute: 2.9 10*3/uL (ref 0.7–3.1)
Lymphs: 49 %
MCH: 27.7 pg (ref 26.6–33.0)
MCHC: 32.8 g/dL (ref 31.5–35.7)
MCV: 84 fL (ref 79–97)
Monocytes Absolute: 0.6 10*3/uL (ref 0.1–0.9)
Monocytes: 10 %
Neutrophils Absolute: 2.3 10*3/uL (ref 1.4–7.0)
Neutrophils: 38 %
Platelets: 302 10*3/uL (ref 150–450)
RBC: 4.8 x10E6/uL (ref 3.77–5.28)
RDW: 12.5 % (ref 11.7–15.4)
WBC: 6 10*3/uL (ref 3.4–10.8)

## 2019-12-16 LAB — COMPREHENSIVE METABOLIC PANEL
ALT: 17 IU/L (ref 0–32)
AST: 20 IU/L (ref 0–40)
Albumin/Globulin Ratio: 1.3 (ref 1.2–2.2)
Albumin: 4.5 g/dL (ref 3.8–4.8)
Alkaline Phosphatase: 79 IU/L (ref 39–117)
BUN/Creatinine Ratio: 25 (ref 12–28)
BUN: 24 mg/dL (ref 8–27)
Bilirubin Total: 0.3 mg/dL (ref 0.0–1.2)
CO2: 22 mmol/L (ref 20–29)
Calcium: 10 mg/dL (ref 8.7–10.3)
Chloride: 97 mmol/L (ref 96–106)
Creatinine, Ser: 0.96 mg/dL (ref 0.57–1.00)
GFR calc Af Amer: 74 mL/min/{1.73_m2} (ref >59)
GFR calc non Af Amer: 64 mL/min/{1.73_m2} (ref >59)
Globulin, Total: 3.6 g/dL (ref 1.5–4.5)
Glucose: 128 mg/dL — ABNORMAL HIGH (ref 65–99)
Potassium: 4 mmol/L (ref 3.5–5.2)
Sodium: 134 mmol/L (ref 134–144)
Total Protein: 8.1 g/dL (ref 6.0–8.5)

## 2019-12-16 LAB — LIPID PANEL
Chol/HDL Ratio: 4.2 ratio (ref 0.0–4.4)
Cholesterol, Total: 200 mg/dL — ABNORMAL HIGH (ref 100–199)
HDL: 48 mg/dL (ref >39)
LDL Chol Calc (NIH): 130 mg/dL — ABNORMAL HIGH (ref 0–99)
Triglycerides: 124 mg/dL (ref 0–149)
VLDL Cholesterol Cal: 22 mg/dL (ref 5–40)

## 2019-12-16 MED ORDER — ATORVASTATIN CALCIUM 10 MG PO TABS: 10.0000 mg | ORAL_TABLET | Freq: Every day | ORAL | 1 refills | Status: DC

## 2019-12-16 NOTE — Telephone Encounter (Signed)
-----   Message from Trinna Post, [place] sent at 12/16/2019  8:47 AM EST ----- Her labs are OK except her cholesterol which is high. I have gotten faxes from her pharmacy that says she is not compliant with taking her cholesterol medication. She needs to be taking it. I've sent it in again.

## 2019-12-16 NOTE — Telephone Encounter (Signed)
Patient was advised. Patient states that she had contacted her pharmacy 3 weeks ago for a refill on her cholesterol medication but they stated to her they was waiting for a response from the provider. Advised patient that a refill was send in today for 90 days with 1 refill.FYI

## 2020-01-10 ENCOUNTER — Other Ambulatory Visit: Payer: Self-pay | Admitting: Physician Assistant

## 2020-01-10 DIAGNOSIS — G6289 Other specified polyneuropathies: Secondary | ICD-10-CM

## 2020-01-10 MED ORDER — GABAPENTIN 300 MG PO CAPS: 300.0000 mg | ORAL_CAPSULE | Freq: Every day | ORAL | 0 refills | Status: DC

## 2020-01-10 NOTE — Telephone Encounter (Signed)
Medication Refill - Medication: gabapentin (NEURONTIN) 300 MG capsule    Preferred Pharmacy (with phone number or street name):  [id number] - [place] ([place]), [place] - [address] Phone:  [phone]  Fax:  [phone]       Agent: Please be advised that RX refills may take up to 3 business days. We ask that you follow-up with your pharmacy.  

## 2020-01-10 NOTE — Telephone Encounter (Signed)
Requested Prescriptions  Pending Prescriptions Disp Refills  . gabapentin (NEURONTIN) 300 MG capsule 90 capsule 0    Sig: Take 1 capsule (300 mg total) by mouth at bedtime.     Neurology: Anticonvulsants - gabapentin Passed - 01/10/2020  2:22 PM      Passed - Valid encounter within last 12 months    Recent Outpatient Visits          3 weeks ago Type 2 diabetes mellitus without complication, without long-term current use of insulin [facility])   [place], [place], PA-C   6 months ago Type 2 diabetes mellitus without complication, without long-term current use of insulin [facility])   [place], [place], [place]   9 months ago Syncope, unspecified syncope type   [facility], [place], [place]   1 year ago Hypertension associated with diabetes [facility])   [facility] [place], [place], [place]   1 year ago Muscle strain   [place], Wendee Bea[place], [place]      Future Appointments            In 2 months Terrilee Croak, Wendee Beavers, [place], [facility]

## 2020-01-17 ENCOUNTER — Other Ambulatory Visit: Payer: Self-pay | Admitting: Physician Assistant

## 2020-01-17 DIAGNOSIS — G8929 Other chronic pain: Secondary | ICD-10-CM

## 2020-01-17 NOTE — Telephone Encounter (Signed)
L.O.V. was on 12/15/19 and upcoming appointment on 03/16/2020. Please advise.

## 2020-03-01 DIAGNOSIS — H2513 Age-related nuclear cataract, bilateral: Secondary | ICD-10-CM | POA: Diagnosis not present

## 2020-03-01 LAB — HM DIABETES EYE EXAM

## 2020-03-14 NOTE — Progress Notes (Signed)
I,Roshena L Chambers,acting as a scribe for Trinna Post, PA-C.,have documented all relevant documentation on the behalf of Trinna Post, PA-C,as directed by  Trinna Post, PA-C while in the presence of Trinna Post, PA-C.   Established patient visit    Patient: Julia Robertson   DOB: January 15, 1956   64 y.o. Female  MRN: [id number] Visit Date: 03/16/2020  Today's healthcare provider: Trinna Post, PA-C   Chief Complaint  Patient presents with  . Diabetes  . Hyperlipidemia  . Hypertension   Subjective    HPI Diabetes Mellitus Type II, Follow-up  Lab Results  Component Value Date   HGBA1C 6.3 (A) 12/15/2019   HGBA1C 6.6 (A) 07/01/2019   HGBA1C 11.1 (A) 10/30/2018   Last seen for diabetes 3 months ago.  Management since then includes continuing same medication. She reports good compliance with treatment. She is not having side effects.  Symptoms: No fatigue No foot ulcerations No appetite changes No nausea No paresthesia (numbness or tingling) of the feet  No polydipsia (excessive thirst) No polyuria (frequent urination) No visual disturbances  No vomiting  Home blood sugar records: fasting range: 89-142  Episodes of hypoglycemia? No    Current regiment: Trulicity 1.5mg  weekly, metformin 1000 mg BID Most Recent Eye Exam: 03/01/2020 Current exercise: walking Current diet habits: in general, an "unhealthy" diet  Pertinent Labs: Lab Results  Component Value Date   CHOL 200 (H) 12/15/2019   HDL 48 12/15/2019   LDLCALC 130 (H) 12/15/2019   TRIG 124 12/15/2019   CHOLHDL 4.2 12/15/2019   Lab Results  Component Value Date   NA 134 12/15/2019   K 4.0 12/15/2019   CO2 22 12/15/2019   GLUCOSE 128 (H) 12/15/2019   BUN 24 12/15/2019   CREATININE 0.96 12/15/2019   CALCIUM 10.0 12/15/2019   GFRNONAA 64 12/15/2019   GFRAA 74 12/15/2019     Wt Readings from Last 3 Encounters:  03/16/20 248 lb (112.5 kg)  12/15/19 245 lb 6.4 oz (111.3  kg)  07/01/19 244 lb (110.7 kg)    ----------------------------------------------------------------------------------------- Lipid/Cholesterol, Follow-up  Last lipid panel Other pertinent labs  Lab Results  Component Value Date   CHOL 200 (H) 12/15/2019   HDL 48 12/15/2019   LDLCALC 130 (H) 12/15/2019   TRIG 124 12/15/2019   CHOLHDL 4.2 12/15/2019   Lab Results  Component Value Date   ALT 17 12/15/2019   AST 20 12/15/2019   PLT 302 12/15/2019   TSH 1.160 09/30/2018     She was last seen for this 3 months ago.  Management since that visit includes advising patient to take medication regularly.  She reports good compliance with treatment. She is not having side effects.  Symptoms: No chest pain No chest pressure/discomfort No dyspnea Yes lower extremity edema (right leg swelling and cramps) No numbness or tingling of extremity No orthopnea No palpitations No paroxysmal nocturnal dyspnea No speech difficulty No syncope  Current diet: in general, an "unhealthy" diet Current exercise: walking  Wt Readings from Last 3 Encounters:  03/16/20 248 lb (112.5 kg)  12/15/19 245 lb 6.4 oz (111.3 kg)  07/01/19 244 lb (110.7 kg)   The 10-year ASCVD risk score Mikey Bussing DC Jr., et al., 2013) is: 21.3%   Hypertension, follow-up  BP Readings from Last 3 Encounters:  03/16/20 138/76  12/15/19 118/82  07/01/19 106/73   Wt Readings from Last 3 Encounters:  03/16/20 248 lb (112.5 kg)  12/15/19 245 lb 6.4  oz (111.3 kg)  07/01/19 244 lb (110.7 kg)     She was last seen for hypertension 3 months ago.  BP at that visit was 118/82. Management since that visit includes continuing same medication.  She reports good compliance with treatment. She is not having side effects.  She is following a Regular diet. She is exercising. She does not smoke.  Use of agents associated with hypertension: none.   Outside blood pressures are checked occasionally.  Symptoms:  YES NO    []     [x]    Chest Pain   []    [x]    Chest pressure/discomfort   []    [x]    Palpitations   []    [x]    Dyspnea   []    [x]    Orthopnea   []    [x]    Paroxysmal nocturnal dyspnea   []   [x]    Lower extremity edema   []    [x]   Syncope   Pertinent labs: Lab Results  Component Value Date   CHOL 200 (H) 12/15/2019   HDL 48 12/15/2019   LDLCALC 130 (H) 12/15/2019   TRIG 124 12/15/2019   CHOLHDL 4.2 12/15/2019   Lab Results  Component Value Date   NA 134 12/15/2019   K 4.0 12/15/2019   CO2 22 12/15/2019   GLUCOSE 128 (H) 12/15/2019   BUN 24 12/15/2019   CREATININE 0.96 12/15/2019   CALCIUM 10.0 12/15/2019   GFRNONAA 64 12/15/2019   GFRAA 74 12/15/2019     The 10-year ASCVD risk score Mikey Bussing DC Jr., et al., 2013) is: 21.3%   --------------------------------------------------------------------------------------------------- Wrist mass: Patient complain of knot of right wrist that appeared 1 week ago. Patient denies change in size. The is some pain when she moves her wrist.   Increasing numbness and tingling in her legs. She is currently on 300 mg gabapentin QHS.   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 She uses tramadol with significant relief. Last fill Tramadol 50 mg qd prn #15.      Medications: Outpatient Medications Prior to Visit  Medication Sig  . amLODipine (NORVASC) 10 MG tablet Take 1 tablet by mouth once daily  . atorvastatin (LIPITOR) 10 MG tablet Take 1 tablet (10 mg total) by mouth daily.  . Dulaglutide (TRULICITY) 1.5 0000000 SOPN Inject 1.5 mg into the skin once a week.  Marland Kitchen glucose blood test strip Use as instructed  . lisinopril-hydrochlorothiazide (ZESTORETIC) 20-25 MG tablet Take 1 tablet by mouth once daily  . metFORMIN (GLUCOPHAGE) 1000 MG tablet Take 1 tablet (1,000 mg total) by mouth 2 (two) times daily with a meal.  . Multiple Vitamin (MULTIVITAMIN) capsule Take 1 capsule by mouth daily.  . traZODone (DESYREL) 100 MG tablet Take 1 tablet (100 mg total) by  mouth at bedtime.  . [DISCONTINUED] gabapentin (NEURONTIN) 300 MG capsule Take 1 capsule (300 mg total) by mouth at bedtime.  . [DISCONTINUED] traMADol (ULTRAM) 50 MG tablet TAKE 1 TABLET BY MOUTH EVERY 8 HOURS AS NEEDED FOR SEVERE PAIN  . diclofenac Sodium (VOLTAREN) 1 % GEL Apply 4 g topically 4 (four) times daily. (Patient not taking: Reported on 03/16/2020)  . [DISCONTINUED] cyclobenzaprine (FLEXERIL) 10 MG tablet Take 1 tablet (10 mg total) by mouth 2 (two) times daily as needed for muscle spasms. (Patient not taking: Reported on 03/16/2020)  . [DISCONTINUED] meloxicam (MOBIC) 7.5 MG tablet Take 1 tablet (7.5 mg total) by mouth daily. (Patient not taking: Reported on 03/16/2020)   No facility-administered medications prior  to visit.    Review of Systems  Constitutional: Negative for appetite change, chills, fatigue and fever.  Respiratory: Negative for chest tightness and shortness of breath.   Cardiovascular: Positive for leg swelling (in right ankle). Negative for chest pain and palpitations.  Gastrointestinal: Negative for abdominal pain, nausea and vomiting.  Musculoskeletal: Positive for myalgias (leg cramps at night).  Skin:       Mass on right wrist.  Neurological: Negative for dizziness and weakness.       Objective    BP 138/76 (BP Location: Right Arm, Patient Position: Sitting, Cuff Size: Large)   Pulse 76   Temp (!) 96.8 F (36 C) (Temporal)   Resp 16   Wt 248 lb (112.5 kg)   SpO2 98% Comment: room air  BMI 41.27 kg/m    Physical Exam Constitutional:      Appearance: Normal appearance.  Cardiovascular:     Rate and Rhythm: Normal rate and regular rhythm.     Heart sounds: Normal heart sounds.  Pulmonary:     Effort: Pulmonary effort is normal.     Breath sounds: Normal breath sounds.  Musculoskeletal:     Right wrist: Deformity (Ganglion cystic lesion on right dorsal aspect of wrist) present.  Skin:    General: Skin is warm and dry.  Neurological:      Mental Status: She is alert and oriented to person, place, and time. Mental status is at baseline.  Psychiatric:        Mood and Affect: Mood normal.        Behavior: Behavior normal.       No results found for any visits on 03/16/20.   Assessment & Plan    1. Type 2 diabetes mellitus without complication, without long-term current use of insulin (HCC)  Well controlled, need to check A1c today Continue current medications UTD on vaccines, eye exam, foot exam On ACEi On Statin Discussed diet and exercise F/u in 4 months for chronic and CPE  - HgB A1c  2. Hypercholesterolemia  Uncontrolled - wasn't compliant with medicines, currently taking statin nightly Continue statin Repeat FLP Goal LDL < 75  - Lipid panel  3. Hypertension associated with diabetes ([place]) Well controlled Continue current medications Recheck metabolic panel F/u in 4 months  4. Ganglion cyst of dorsum of right wrist Ganglion cystic lesion on right dorsal aspect of wrist. Patient declines referral to Orthopedics.  5. Other polyneuropathy Will increase Gabapentin to 600mg  daily.  6. 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 - gabapentin (NEURONTIN) 600 MG tablet; Take 1 tablet (600 mg total) by mouth at bedtime.  Dispense: 90 tablet; Refill: 1  7. Insomnia, unspecified type  Tradazone PRN.  8. Chronic pain of right knee Refill Tramadol.  - traMADol (ULTRAM) 50 MG tablet; TAKE 1 TABLET BY MOUTH EVERY 8 HOURS AS NEEDED FOR SEVERE PAIN  Dispense: 15 tablet; Refill: 0 htn-.stable ccm   Return in about 4 months (around 07/16/2020).      ITrinna Post, PA-C, have reviewed all documentation for this visit. The documentation on 03/16/20 for the exam, diagnosis, procedures, and orders are all accurate and complete.    Paulene Floor  [facility] [phone] (phone) [phone] (fax)  [place]

## 2020-03-16 ENCOUNTER — Other Ambulatory Visit: Payer: Self-pay

## 2020-03-16 ENCOUNTER — Ambulatory Visit (INDEPENDENT_AMBULATORY_CARE_PROVIDER_SITE_OTHER): Payer: BC Managed Care – PPO | Admitting: Physician Assistant

## 2020-03-16 ENCOUNTER — Encounter: Payer: Self-pay | Admitting: Physician Assistant

## 2020-03-16 VITALS — BP 138/76 | HR 76 | Temp 96.8°F | Resp 16 | Wt 248.0 lb

## 2020-03-16 DIAGNOSIS — G6289 Other specified polyneuropathies: Secondary | ICD-10-CM

## 2020-03-16 DIAGNOSIS — E7800 Pure hypercholesterolemia, unspecified: Secondary | ICD-10-CM | POA: Diagnosis not present

## 2020-03-16 DIAGNOSIS — M67431 Ganglion, right wrist: Secondary | ICD-10-CM | POA: Diagnosis not present

## 2020-03-16 DIAGNOSIS — E1159 Type 2 diabetes mellitus with other circulatory complications: Secondary | ICD-10-CM | POA: Diagnosis not present

## 2020-03-16 DIAGNOSIS — I10 Essential (primary) hypertension: Secondary | ICD-10-CM

## 2020-03-16 DIAGNOSIS — G8929 Other chronic pain: Secondary | ICD-10-CM

## 2020-03-16 DIAGNOSIS — E119 Type 2 diabetes mellitus without complications: Secondary | ICD-10-CM | POA: Diagnosis not present

## 2020-03-16 DIAGNOSIS — E0843 Diabetes mellitus due to underlying condition with diabetic autonomic (poly)neuropathy: Secondary | ICD-10-CM

## 2020-03-16 DIAGNOSIS — G4700 Insomnia, unspecified: Secondary | ICD-10-CM

## 2020-03-16 DIAGNOSIS — M25561 Pain in right knee: Secondary | ICD-10-CM

## 2020-03-16 DIAGNOSIS — I152 Hypertension secondary to endocrine disorders: Secondary | ICD-10-CM

## 2020-03-16 MED ORDER — TRAMADOL HCL 50 MG PO TABS: ORAL_TABLET | ORAL | 0 refills | Status: DC

## 2020-03-16 MED ORDER — GABAPENTIN 600 MG PO TABS: 600.0000 mg | ORAL_TABLET | Freq: Every day | ORAL | 1 refills | Status: DC

## 2020-03-17 ENCOUNTER — Telehealth: Payer: Self-pay

## 2020-03-17 DIAGNOSIS — E7800 Pure hypercholesterolemia, unspecified: Secondary | ICD-10-CM

## 2020-03-17 LAB — LIPID PANEL
Chol/HDL Ratio: 3.5 ratio (ref 0.0–4.4)
Cholesterol, Total: 176 mg/dL (ref 100–199)
HDL: 50 mg/dL (ref >39)
LDL Chol Calc (NIH): 105 mg/dL — ABNORMAL HIGH (ref 0–99)
Triglycerides: 118 mg/dL (ref 0–149)
VLDL Cholesterol Cal: 21 mg/dL (ref 5–40)

## 2020-03-17 LAB — HEMOGLOBIN A1C
Est. average glucose Bld gHb Est-mCnc: 137 mg/dL
Hgb A1c MFr Bld: 6.4 % — ABNORMAL HIGH (ref 4.8–5.6)

## 2020-03-17 NOTE — Telephone Encounter (Signed)
-----   Message from Trinna Post, [place] sent at 03/17/2020 10:01 AM EDT ----- Sugars stable and well controlled. Cholesterol improved but still slightly above goal. Would recommend increasing Lipitor to 20 mg QHS. She can take two of her current pills nightly until she picks up the new Rx. Please send in Lipitor 20 mg QHS #90 with one refill, thanks.

## 2020-03-17 NOTE — Telephone Encounter (Signed)
Call patient and no answer, left vm for patient to return call. If patient calls back ok for [facility] to advise patient.

## 2020-03-20 NOTE — Telephone Encounter (Signed)
Patient called, no answer. Unable to leave message on voicemail.

## 2020-03-20 NOTE — Telephone Encounter (Signed)
Attempted to call patient with results- voice mail is full- unable to leave message-SMF message sent with call back #

## 2020-03-24 MED ORDER — ATORVASTATIN CALCIUM 20 MG PO TABS: 20.0000 mg | ORAL_TABLET | Freq: Every day | ORAL | 1 refills | Status: DC

## 2020-03-24 NOTE — Telephone Encounter (Signed)
Patient was advised and states that it was fine to increase her Lipitor 20 MG.

## 2020-04-13 ENCOUNTER — Ambulatory Visit (INDEPENDENT_AMBULATORY_CARE_PROVIDER_SITE_OTHER): Payer: BC Managed Care – PPO | Admitting: Physician Assistant

## 2020-04-13 DIAGNOSIS — J301 Allergic rhinitis due to pollen: Secondary | ICD-10-CM

## 2020-04-13 DIAGNOSIS — G8929 Other chronic pain: Secondary | ICD-10-CM

## 2020-04-13 DIAGNOSIS — M25561 Pain in right knee: Secondary | ICD-10-CM

## 2020-04-13 MED ORDER — TRAMADOL HCL 50 MG PO TABS: ORAL_TABLET | ORAL | 0 refills | Status: DC

## 2020-04-13 MED ORDER — LEVOCETIRIZINE DIHYDROCHLORIDE 5 MG PO TABS: 5.0000 mg | ORAL_TABLET | Freq: Every evening | ORAL | 0 refills | Status: DC

## 2020-04-13 NOTE — Addendum Note (Signed)
Addended by: Trinna Post on: 04/13/2020 03:44 PM   Modules accepted: Orders

## 2020-04-13 NOTE — Progress Notes (Signed)
     Established patient visit   Patient: Julia Robertson   DOB: [birth year]   64 y.o. Female  MRN: [id number] Visit Date: 04/13/2020  Today's healthcare provider: Trinna Post, PA-C   Chief Complaint  Patient presents with  . Allergic Rhinitis     Virtual Visit via Telephone Note   This visit type was conducted due to national recommendations for restrictions regarding the COVID-19 Pandemic (e.g. social distancing) in an effort to limit this patient's exposure and mitigate transmission in our community. Due to her co-morbid illnesses, this patient is at least at moderate risk for complications without adequate follow up. This format is felt to be most appropriate for this patient at this time. The patient did not have access to video technology or had technical difficulties with video requiring transitioning to audio format only (telephone). Physical exam was limited to content and character of the telephone converstion.    Patient location: Home Provider location: Office   Subjective    HPI   Patient presents today with history of nasal congestion ongoing for several weeks. She reports sneezing, coughing after sitting on her porch. She denies fevers, chills, nausea, vomiting, SOB. History of HTN, DM II.       Medications: Outpatient Medications Prior to Visit  Medication Sig  . amLODipine (NORVASC) 10 MG tablet Take 1 tablet by mouth once daily  . atorvastatin (LIPITOR) 20 MG tablet Take 1 tablet (20 mg total) by mouth daily.  . diclofenac Sodium (VOLTAREN) 1 % GEL Apply 4 g topically 4 (four) times daily. (Patient not taking: Reported on 03/16/2020)  . Dulaglutide (TRULICITY) 1.5 0000000 SOPN Inject 1.5 mg into the skin once a week.  . gabapentin (NEURONTIN) 600 MG tablet Take 1 tablet (600 mg total) by mouth at bedtime.  Marland Kitchen glucose blood test strip Use as instructed  . lisinopril-hydrochlorothiazide (ZESTORETIC) 20-25 MG tablet Take 1 tablet by mouth once daily  .  metFORMIN (GLUCOPHAGE) 1000 MG tablet Take 1 tablet (1,000 mg total) by mouth 2 (two) times daily with a meal.  . Multiple Vitamin (MULTIVITAMIN) capsule Take 1 capsule by mouth daily.  . traMADol (ULTRAM) 50 MG tablet TAKE 1 TABLET BY MOUTH EVERY 8 HOURS AS NEEDED FOR SEVERE PAIN  . traZODone (DESYREL) 100 MG tablet Take 1 tablet (100 mg total) by mouth at bedtime.   No facility-administered medications prior to visit.    Review of Systems    Objective    There were no vitals taken for this visit.   Physical Exam    No results found for any visits on 04/13/20.  Assessment & Plan    1. Allergic rhinitis due to pollen, unspecified seasonality  Advised on daily use of 2nd generation antihistamine as well as flonase.   2. Knee Pain  Dx pulled for refill. Refill tramadol 50 mg #15.       ITrinna Post, PA-C, have reviewed all documentation for this visit. The documentation on 04/13/20 for the exam, diagnosis, procedures, and orders are all accurate and complete.  I have spent 15 minutes non face-to-face time with this patient, >50% of which was spent on counseling and coordination of care.    Paulene Floor  [facility] [phone] (phone) [phone] (fax)  [place]

## 2020-04-14 ENCOUNTER — Other Ambulatory Visit: Payer: Self-pay | Admitting: Physician Assistant

## 2020-04-14 DIAGNOSIS — G4700 Insomnia, unspecified: Secondary | ICD-10-CM

## 2020-06-19 ENCOUNTER — Other Ambulatory Visit: Payer: Self-pay | Admitting: Physician Assistant

## 2020-06-19 DIAGNOSIS — G8929 Other chronic pain: Secondary | ICD-10-CM

## 2020-06-19 DIAGNOSIS — E0843 Diabetes mellitus due to underlying condition with diabetic autonomic (poly)neuropathy: Secondary | ICD-10-CM

## 2020-06-19 NOTE — Telephone Encounter (Signed)
Requested medication (s) are due for refill today: Yes  Requested medication (s) are on the active medication list: Yes  Last refill:  Gabapentin 03/16/20; Tramadol 04/13/20  Future visit scheduled: Yes  Notes to clinic:  Unable to refill per protocol, cannot delegate Tramadol. Patient takes Gabapentin 2 tabs at hs and will need a new Rx reflecting the change.    Requested Prescriptions  Pending Prescriptions Disp Refills   gabapentin (NEURONTIN) 600 MG tablet 90 tablet 1    Sig: Take 1 tablet (600 mg total) by mouth at bedtime.      Neurology: Anticonvulsants - gabapentin Passed - 06/19/2020  4:31 PM      Passed - Valid encounter within last 12 months    Recent Outpatient Visits           2 months ago Allergic rhinitis due to pollen, unspecified seasonality   [place], Adriana M, PA-C   3 months ago Type 2 diabetes mellitus without complication, without long-term current use of insulin [facility])   [facility] [place], Fabio Bering M, PA-C   6 months ago Type 2 diabetes mellitus without complication, without long-term current use of insulin [facility])   [facility] [place], Fabio Bering M, [place]   11 months ago Type 2 diabetes mellitus without complication, without long-term current use of insulin [facility])   [facility] [place], [place], [place]   1 year ago Syncope, unspecified syncope type   [facility], Clearnce Sorrel, PA-C       Future Appointments             In 4 weeks [place], Adriana M, PA-C [employer]              traMADol (ULTRAM) 50 MG tablet 15 tablet 0    Sig: TAKE 1 TABLET BY MOUTH EVERY 8 HOURS AS NEEDED FOR SEVERE PAIN      Not Delegated - Analgesics:  Opioid Agonists Failed - 06/19/2020  4:31 PM      Failed - This refill cannot be delegated      Failed - Urine Drug Screen completed in last 360 days.      Passed - Valid encounter within last 6 months    Recent  Outpatient Visits           2 months ago Allergic rhinitis due to pollen, unspecified seasonality   [place], Adriana M, PA-C   3 months ago Type 2 diabetes mellitus without complication, without long-term current use of insulin [facility])   [place], [place], PA-C   6 months ago Type 2 diabetes mellitus without complication, without long-term current use of insulin [facility])   [facility] [place], Fabio Bering M, [place]   11 months ago Type 2 diabetes mellitus without complication, without long-term current use of insulin [facility])   [facility] [place], [place], [place]   1 year ago Syncope, unspecified syncope type   [facility] Clearnce Sorrel, [place]       Future Appointments             In 4 weeks Trinna Post, PA-C [employer]

## 2020-06-19 NOTE — Telephone Encounter (Signed)
PT need a refill  gabapentin (NEURONTIN) 600 MG tablet [[id number]] / PT is taking 2 pills every night  traMADol (ULTRAM) 50 MG tablet [[id number]]  [place] [address]), [place] [address]  [place]) [place] [address]  Phone: [phone] Fax: [phone]

## 2020-06-20 MED ORDER — GABAPENTIN 600 MG PO TABS: 600.0000 mg | ORAL_TABLET | Freq: Every day | ORAL | 1 refills | Status: DC

## 2020-06-20 MED ORDER — TRAMADOL HCL 50 MG PO TABS: ORAL_TABLET | ORAL | 0 refills | Status: DC

## 2020-07-04 ENCOUNTER — Ambulatory Visit: Payer: BC Managed Care – PPO | Admitting: Podiatry

## 2020-07-04 ENCOUNTER — Other Ambulatory Visit: Payer: Self-pay

## 2020-07-04 DIAGNOSIS — E0843 Diabetes mellitus due to underlying condition with diabetic autonomic (poly)neuropathy: Secondary | ICD-10-CM

## 2020-07-04 DIAGNOSIS — L603 Nail dystrophy: Secondary | ICD-10-CM

## 2020-07-04 DIAGNOSIS — B351 Tinea unguium: Secondary | ICD-10-CM

## 2020-07-04 DIAGNOSIS — M79676 Pain in unspecified toe(s): Secondary | ICD-10-CM

## 2020-07-04 MED ORDER — GENTAMICIN SULFATE 0.1 % EX CREA
1.0000 | TOPICAL_CREAM | Freq: Two times a day (BID) | CUTANEOUS | 1 refills | Status: DC
Start: 2020-07-04 — End: 2021-12-05

## 2020-07-04 NOTE — Progress Notes (Signed)
    Subjective: Patient is a 64 y.o. female presenting to the office for follow-up evaluation of bilateral thickened nails.  Patient especially complains of a thickened dystrophic painful nail to the right hallux nail plate.  Last visit on 09/17/2019 the nails were debrided she states she only got temporary relief.  She is wondering if perhaps she needs to have the right hallux nail plate permanently removed.  Thickened nails that cause pain while ambulating in shoes. She is unable to trim her own nails. Patient presents today for further treatment and evaluation.  Past Medical History:  Diagnosis Date  . Diabetes mellitus without complication ([place])   . Hypertension   . Neuromuscular disorder ([place])   . Obesity     Objective:  Physical Exam General: Alert and oriented x3 in no acute distress  Dermatology: Hyperkeratotic lesion(s) present on the bilateral feet. Pain on palpation with a central nucleated core noted. Skin is warm, dry and supple bilateral lower extremities. Negative for open lesions or macerations. Nails are tender, long, thickened and dystrophic with subungual debris, consistent with onychomycosis, 1-5 bilateral. No signs of infection noted.  Vascular: Palpable pedal pulses bilaterally. No edema or erythema noted. Capillary refill within normal limits.  Neurological: Epicritic and protective threshold diminished bilaterally.   Musculoskeletal Exam: Pain on palpation at the keratotic lesion(s) noted. Range of motion within normal limits bilateral. Muscle strength 5/5 in all groups bilateral.  Assessment: 1. Onychodystrophic nails 1-5 bilateral with hyperkeratosis of nails.  2. Onychomycosis of nail due to dermatophyte bilateral 3.  Painful dystrophic nail right hallux nail plate   Plan of Care:  1. Patient evaluated. 2. Mechanical debridement of nails 1-5 bilaterally performed using a nail nipper. Filed with dremel without incident.  5.  Today we discussed different  treatment options regarding the patient's right hallux nail plate.  The patient would like the nail permanently totally removed.  I do agree and believe this would provide the best long-term solution.   6.  The patient opted for total permanent nail avulsion right hallux.  Prior to procedure the toe was prepped in aseptic manner and 3 mL of 2% lidocaine plain was infiltrated in a digital block fashion.   7.  The nail was avulsed in toto and 3 x 75-FFMBWG application of phenol followed by alcohol flush was performed.  Light dressing applied.  Post procedure care instructions were provided.   8.  Prescription for gentamicin cream  9.  Patient is to return to the clinic in 1 month  Edrick Kins, DPM [facility]  Dr. Edrick Kins, [place]                                        [place], [place] [address]                Office [phone]  Fax [phone]

## 2020-07-04 NOTE — Patient Instructions (Signed)

## 2020-07-17 ENCOUNTER — Ambulatory Visit: Payer: BC Managed Care – PPO | Admitting: Physician Assistant

## 2020-07-30 ENCOUNTER — Other Ambulatory Visit: Payer: Self-pay | Admitting: Physician Assistant

## 2020-07-30 DIAGNOSIS — I152 Hypertension secondary to endocrine disorders: Secondary | ICD-10-CM

## 2020-07-30 DIAGNOSIS — G4700 Insomnia, unspecified: Secondary | ICD-10-CM

## 2020-07-30 DIAGNOSIS — E1159 Type 2 diabetes mellitus with other circulatory complications: Secondary | ICD-10-CM

## 2020-07-30 NOTE — Telephone Encounter (Signed)
Requested Prescriptions  Pending Prescriptions Disp Refills  . lisinopril-hydrochlorothiazide (ZESTORETIC) 20-25 MG tablet [Pharmacy Med Name: Lisinopril-hydroCHLOROthiazide 20-25 MG Oral Tablet] 90 tablet 0    Sig: Take 1 tablet by mouth once daily     Cardiovascular:  ACEI + Diuretic Combos Failed - 07/30/2020  8:49 AM      Failed - Na in normal range and within 180 days    Sodium  Date Value Ref Range Status  12/15/2019 134 134 - 144 mmol/L Final  09/23/2012 138 136 - 145 mmol/L Final         Failed - K in normal range and within 180 days    Potassium  Date Value Ref Range Status  12/15/2019 4.0 3.5 - 5.2 mmol/L Final  09/23/2012 4.1 3.5 - 5.1 mmol/L Final         Failed - Cr in normal range and within 180 days    Creatinine  Date Value Ref Range Status  09/23/2012 0.92 0.60 - 1.30 mg/dL Final   Creatinine, Ser  Date Value Ref Range Status  12/15/2019 0.96 0.57 - 1.00 mg/dL Final         Failed - Ca in normal range and within 180 days    Calcium  Date Value Ref Range Status  12/15/2019 10.0 8.7 - 10.3 mg/dL Final   Calcium, Total  Date Value Ref Range Status  09/23/2012 8.8 8.5 - 10.1 mg/dL Final         Passed - Patient is not pregnant      Passed - Last BP in normal range    BP Readings from Last 1 Encounters:  03/16/20 138/76         Passed - Valid encounter within last 6 months    Recent Outpatient Visits          3 months ago Allergic rhinitis due to pollen, unspecified seasonality   [employer], Adriana M, PA-C   4 months ago Type 2 diabetes mellitus without complication, without long-term current use of insulin ([place])   [place], Adriana M, PA-C   7 months ago Type 2 diabetes mellitus without complication, without long-term current use of insulin [facility])   [facility] [place], [place], PA-C   1 year ago Type 2 diabetes mellitus without complication, without long-term current use of insulin  [facility])   [facility] [place], [place], [place]   1 year ago Syncope, unspecified syncope type   [facility], Anderson [place] M, [place]             . traZODone (DESYREL) 100 MG tablet [Pharmacy Med Name: traZODone HCl 100 MG Oral Tablet] 90 tablet 0    Sig: TAKE 1 TABLET BY MOUTH AT BEDTIME     Psychiatry: Antidepressants - Serotonin Modulator Passed - 07/30/2020  8:49 AM      Passed - Valid encounter within last 6 months    Recent Outpatient Visits          3 months ago Allergic rhinitis due to pollen, unspecified seasonality   [employer], Adriana M, PA-C   4 months ago Type 2 diabetes mellitus without complication, without long-term current use of insulin [facility])   [place], [place], PA-C   7 months ago Type 2 diabetes mellitus without complication, without long-term current use of insulin [facility])   [place], [place], [place]   1 year ago Type 2 diabetes mellitus without  complication, without long-term current use of insulin [facility])   [place], [place], [place]   1 year ago Syncope, unspecified syncope type   [facility], [place], [place]

## 2020-08-01 ENCOUNTER — Ambulatory Visit: Payer: BC Managed Care – PPO | Admitting: Podiatry

## 2020-09-12 ENCOUNTER — Ambulatory Visit: Payer: BC Managed Care – PPO | Admitting: Podiatry

## 2020-09-12 ENCOUNTER — Other Ambulatory Visit: Payer: Self-pay

## 2020-09-12 DIAGNOSIS — M2142 Flat foot [pes planus] (acquired), left foot: Secondary | ICD-10-CM | POA: Diagnosis not present

## 2020-09-12 DIAGNOSIS — M79671 Pain in right foot: Secondary | ICD-10-CM | POA: Diagnosis not present

## 2020-09-12 DIAGNOSIS — M79672 Pain in left foot: Secondary | ICD-10-CM

## 2020-09-12 DIAGNOSIS — M778 Other enthesopathies, not elsewhere classified: Secondary | ICD-10-CM

## 2020-09-12 DIAGNOSIS — M2141 Flat foot [pes planus] (acquired), right foot: Secondary | ICD-10-CM

## 2020-09-12 NOTE — Progress Notes (Signed)
   Subjective:  64 y.o. female presenting today for new complaint today regarding pain and tenderness to the bilateral feet.  Patient states that she does have flat feet.  She has been experiencing pain.  She was last seen in the office for a total permanent nail avulsion to the right hallux and is doing very well.  She presents for further treatment evaluation   Past Medical History:  Diagnosis Date  . Diabetes mellitus without complication ([place])   . Hypertension   . Neuromuscular disorder ([place])   . Obesity        Objective/Physical Exam General: The patient is alert and oriented x3 in no acute distress.  Dermatology: Skin is warm, dry and supple bilateral lower extremities. Negative for open lesions or macerations.  Vascular: Palpable pedal pulses bilaterally. No edema or erythema noted. Capillary refill within normal limits.  Neurological: Epicritic and protective threshold grossly intact bilaterally.   Musculoskeletal Exam: Range of motion within normal limits to all pedal and ankle joints bilateral. Muscle strength 5/5 in all groups bilateral.  Upon weightbearing there is a medial longitudinal arch collapse bilaterally. Remove foot valgus noted to the bilateral lower extremities with excessive pronation upon mid stance.  Assessment: 1. pes planus bilateral   Plan of Care:  1. Patient was evaluated.  2.  OTC power step insoles provided for the patient.  Wear daily with new balance walking shoes 3.  Return to clinic as needed   Edrick Kins, DPM [facility]  Dr. Edrick Kins, [place]                                        [place], [place] [address]                Office [phone]  Fax [phone]

## 2020-11-01 ENCOUNTER — Other Ambulatory Visit: Payer: Self-pay | Admitting: Physician Assistant

## 2020-11-01 DIAGNOSIS — G4700 Insomnia, unspecified: Secondary | ICD-10-CM

## 2020-11-01 NOTE — Telephone Encounter (Signed)
LOV 04/13/20. Courtesy 30 day supply provided per protocol. Left VM to return call to schedule 6 month OV for further refills.

## 2020-12-14 ENCOUNTER — Other Ambulatory Visit: Payer: Self-pay | Admitting: Physician Assistant

## 2020-12-14 DIAGNOSIS — E119 Type 2 diabetes mellitus without complications: Secondary | ICD-10-CM

## 2020-12-14 DIAGNOSIS — M25561 Pain in right knee: Secondary | ICD-10-CM

## 2020-12-14 DIAGNOSIS — I152 Hypertension secondary to endocrine disorders: Secondary | ICD-10-CM

## 2020-12-14 DIAGNOSIS — G4700 Insomnia, unspecified: Secondary | ICD-10-CM

## 2020-12-14 DIAGNOSIS — G8929 Other chronic pain: Secondary | ICD-10-CM

## 2020-12-14 NOTE — Telephone Encounter (Signed)
Requested medications are due for refill today yes  Requested medications are on the active medication list yes  Last refill various dates for 5 meds  Last visit 03/16/20  Future visit scheduled 12/2020  Notes to clinic Failed protocol due to no valid visit within 6  months.

## 2020-12-14 NOTE — Telephone Encounter (Signed)
I have given her 30 day refills courtesy on her medications except tramadol. I have declined tramadol because she has not followed up as instructed. Her last OV was 02/2020 and she was supposed to follow up 06/2020. She has not followed up appropriately and will need to attend f/u to receive medications. We will also need to discuss controlled substance refills moving forward as she is not following up reliably as is necessary for this medication.

## 2020-12-28 ENCOUNTER — Encounter: Payer: Self-pay | Admitting: Physician Assistant

## 2020-12-28 ENCOUNTER — Other Ambulatory Visit: Payer: Self-pay

## 2020-12-28 ENCOUNTER — Ambulatory Visit: Payer: BC Managed Care – PPO | Admitting: Physician Assistant

## 2020-12-28 VITALS — BP 123/67 | HR 84 | Temp 98.4°F | Wt 231.6 lb

## 2020-12-28 DIAGNOSIS — Z23 Encounter for immunization: Secondary | ICD-10-CM | POA: Diagnosis not present

## 2020-12-28 DIAGNOSIS — G8929 Other chronic pain: Secondary | ICD-10-CM

## 2020-12-28 DIAGNOSIS — E1159 Type 2 diabetes mellitus with other circulatory complications: Secondary | ICD-10-CM | POA: Diagnosis not present

## 2020-12-28 DIAGNOSIS — Z6839 Body mass index (BMI) 39.0-39.9, adult: Secondary | ICD-10-CM

## 2020-12-28 DIAGNOSIS — E0843 Diabetes mellitus due to underlying condition with diabetic autonomic (poly)neuropathy: Secondary | ICD-10-CM

## 2020-12-28 DIAGNOSIS — Z1231 Encounter for screening mammogram for malignant neoplasm of breast: Secondary | ICD-10-CM

## 2020-12-28 DIAGNOSIS — Z0000 Encounter for general adult medical examination without abnormal findings: Secondary | ICD-10-CM | POA: Diagnosis not present

## 2020-12-28 DIAGNOSIS — M25561 Pain in right knee: Secondary | ICD-10-CM

## 2020-12-28 DIAGNOSIS — J40 Bronchitis, not specified as acute or chronic: Secondary | ICD-10-CM

## 2020-12-28 DIAGNOSIS — I152 Hypertension secondary to endocrine disorders: Secondary | ICD-10-CM

## 2020-12-28 DIAGNOSIS — E7800 Pure hypercholesterolemia, unspecified: Secondary | ICD-10-CM | POA: Diagnosis not present

## 2020-12-28 DIAGNOSIS — K635 Polyp of colon: Secondary | ICD-10-CM | POA: Diagnosis not present

## 2020-12-28 DIAGNOSIS — E119 Type 2 diabetes mellitus without complications: Secondary | ICD-10-CM

## 2020-12-28 MED ORDER — METFORMIN HCL 1000 MG PO TABS: 1000.0000 mg | ORAL_TABLET | Freq: Two times a day (BID) | ORAL | 0 refills | Status: DC

## 2020-12-28 MED ORDER — ALBUTEROL SULFATE HFA 108 (90 BASE) MCG/ACT IN AERS: 2.0000 | INHALATION_SPRAY | Freq: Four times a day (QID) | RESPIRATORY_TRACT | 2 refills | Status: DC | PRN

## 2020-12-28 MED ORDER — GABAPENTIN 600 MG PO TABS: 1200.0000 mg | ORAL_TABLET | Freq: Every day | ORAL | 0 refills | Status: DC

## 2020-12-28 MED ORDER — ATORVASTATIN CALCIUM 20 MG PO TABS: 20.0000 mg | ORAL_TABLET | Freq: Every day | ORAL | 1 refills | Status: DC

## 2020-12-28 MED ORDER — LISINOPRIL-HYDROCHLOROTHIAZIDE 20-25 MG PO TABS: 1.0000 | ORAL_TABLET | Freq: Every day | ORAL | 1 refills | Status: DC

## 2020-12-28 MED ORDER — AMLODIPINE BESYLATE 10 MG PO TABS: 10.0000 mg | ORAL_TABLET | Freq: Every day | ORAL | 1 refills | Status: DC

## 2020-12-28 NOTE — Progress Notes (Signed)
Established patient visit   Patient: Julia Robertson   DOB: February 02, 1956   66 y.o. Female  MRN: [id number] Visit Date: 12/28/2020  Today's healthcare provider: Trinna Post, PA-C   Chief Complaint  Patient presents with  . Diabetes  . Hypertension  . Hyperlipidemia  I,Chantal Worthey M Orien Mayhall,acting as a scribe for [employer], PA-C.,have documented all relevant documentation on the behalf of Trinna Post, PA-C,as directed by  Trinna Post, PA-C while in the presence of Trinna Post, PA-C.  Subjective    HPI  Diabetes Mellitus Type II, follow-up  Lab Results  Component Value Date   HGBA1C 6.4 (H) 03/16/2020   HGBA1C 6.3 (A) 12/15/2019   HGBA1C 6.6 (A) 07/01/2019   Last seen for diabetes 9 months ago.  Management since then includes continuing the same treatment. She reports good compliance with treatment. She is not having side effects.   Home blood sugar records: fasting range: 100's-150's  Episodes of hypoglycemia? No    Current insulin regiment:none Most Recent Eye Exam: 03/01/2020  --------------------------------------------------------------------------------------------------- Hypertension, follow-up  BP Readings from Last 3 Encounters:  12/28/20 123/67  03/16/20 138/76  12/15/19 118/82   Wt Readings from Last 3 Encounters:  12/28/20 231 lb 9.6 oz (105.1 kg)  03/16/20 248 lb (112.5 kg)  12/15/19 245 lb 6.4 oz (111.3 kg)     She was last seen for hypertension 9 months ago.  BP at that visit was 138/76. Management since that visit includes continue current medication. She reports good compliance with treatment. She is not having side effects.  She is exercising. She is adherent to low salt diet.   Outside blood pressures are 120's/80's.  She does not smoke.  Use of agents associated with hypertension: none.   --------------------------------------------------------------------------------------------------- Lipid/Cholesterol,  follow-up  Last Lipid Panel: Lab Results  Component Value Date   CHOL 145 12/28/2020   LDLCALC 87 12/28/2020   HDL 38 (L) 12/28/2020   TRIG 106 12/28/2020    She was last seen for this 9 months ago.  Management since that visit includes continue current medication.  She reports good compliance with treatment. She is not having side effects.   Symptoms: No appetite changes No foot ulcerations  No chest pain No chest pressure/discomfort  No dyspnea No orthopnea  No fatigue No lower extremity edema  No palpitations No paroxysmal nocturnal dyspnea  No nausea No numbness or tingling of extremity  No polydipsia No polyuria  No speech difficulty No syncope   She is following a Regular diet. Current exercise: no regular exercise and walking  Last metabolic panel Lab Results  Component Value Date   GLUCOSE 101 (H) 12/28/2020   NA 138 12/28/2020   K 4.2 12/28/2020   BUN 10 12/28/2020   CREATININE 0.77 12/28/2020   GFRNONAA 83 12/28/2020   GFRAA 96 12/28/2020   CALCIUM 9.8 12/28/2020   AST 22 12/28/2020   ALT 21 12/28/2020   The 10-year ASCVD risk score Mikey Bussing DC Jr., et al., 2013) is: 13.9%  ---------------------------------------------------------------------------------------------------  She continues to use tramadol for knee pain very sparingly.   Today she reports some cough and wheezing. This has gone on for five days. She has tested negative for COVID three times. Denies fevers, chills, nausea, vomiting.      Medications: Outpatient Medications Prior to Visit  Medication Sig  . diclofenac Sodium (VOLTAREN) 1 % GEL Apply 4 g topically 4 (four) times daily.  . Dulaglutide (TRULICITY)  1.5 MG/0.5ML SOPN Inject 1.5 mg into the skin once a week.  Marland Kitchen gentamicin cream (GARAMYCIN) 0.1 % Apply 1 application topically 2 (two) times daily.  Marland Kitchen glucose blood test strip Use as instructed  . Multiple Vitamin (MULTIVITAMIN) capsule Take 1 capsule by mouth daily.  . traZODone  (DESYREL) 100 MG tablet TAKE 1 TABLET BY MOUTH AT BEDTIME  . [DISCONTINUED] amLODipine (NORVASC) 10 MG tablet Take 1 tablet by mouth once daily  . [DISCONTINUED] atorvastatin (LIPITOR) 20 MG tablet Take 1 tablet (20 mg total) by mouth daily.  . [DISCONTINUED] gabapentin (NEURONTIN) 600 MG tablet Take 1 tablet (600 mg total) by mouth at bedtime.  . [DISCONTINUED] lisinopril-hydrochlorothiazide (ZESTORETIC) 20-25 MG tablet Take 1 tablet by mouth once daily  . [DISCONTINUED] metFORMIN (GLUCOPHAGE) 1000 MG tablet TAKE 1 TABLET BY MOUTH TWICE DAILY WITH MEALS  . [DISCONTINUED] traMADol (ULTRAM) 50 MG tablet TAKE 1 TABLET BY MOUTH EVERY 8 HOURS AS NEEDED FOR SEVERE PAIN  . levocetirizine (XYZAL) 5 MG tablet Take 1 tablet (5 mg total) by mouth every evening.   No facility-administered medications prior to visit.    Review of Systems     Objective    BP 123/67 (BP Location: Left Arm, Patient Position: Sitting, Cuff Size: Large)   Pulse 84   Temp 98.4 F (36.9 C) (Oral)   Wt 231 lb 9.6 oz (105.1 kg)   SpO2 100%   BMI 38.54 kg/m     Physical Exam Constitutional:      Appearance: Normal appearance.  Cardiovascular:     Rate and Rhythm: Normal rate and regular rhythm.     Pulses: Normal pulses.     Heart sounds: Normal heart sounds.  Pulmonary:     Effort: Pulmonary effort is normal.     Breath sounds: Wheezing present.  Abdominal:     General: Abdomen is flat. Bowel sounds are normal.     Palpations: Abdomen is soft.  Skin:    General: Skin is warm and dry.  Neurological:     General: No focal deficit present.     Mental Status: She is alert and oriented to person, place, and time.  Psychiatric:        Mood and Affect: Mood normal.        Behavior: Behavior normal.       Results for orders placed or performed in visit on 12/28/20  TSH  Result Value Ref Range   TSH 0.748 0.450 - 4.500 uIU/mL  Lipid panel  Result Value Ref Range   Cholesterol, Total 145 100 - 199 mg/dL    Triglycerides 106 0 - 149 mg/dL   HDL 38 (L) >39 mg/dL   VLDL Cholesterol Cal 20 5 - 40 mg/dL   LDL Chol Calc (NIH) 87 0 - 99 mg/dL   Chol/HDL Ratio 3.8 0.0 - 4.4 ratio  Comprehensive metabolic panel  Result Value Ref Range   Glucose 101 (H) 65 - 99 mg/dL   BUN 10 8 - 27 mg/dL   Creatinine, Ser 0.77 0.57 - 1.00 mg/dL   GFR calc non Af Amer 83 >59 mL/min/1.73   GFR calc Af Amer 96 >59 mL/min/1.73   BUN/Creatinine Ratio 13 12 - 28   Sodium 138 134 - 144 mmol/L   Potassium 4.2 3.5 - 5.2 mmol/L   Chloride 99 96 - 106 mmol/L   CO2 24 20 - 29 mmol/L   Calcium 9.8 8.7 - 10.3 mg/dL   Total Protein 8.3 6.0 - 8.5 g/dL  Albumin 4.2 3.8 - 4.8 g/dL   Globulin, Total 4.1 1.5 - 4.5 g/dL   Albumin/Globulin Ratio 1.0 (L) 1.2 - 2.2   Bilirubin Total 0.3 0.0 - 1.2 mg/dL   Alkaline Phosphatase 85 44 - 121 IU/L   AST 22 0 - 40 IU/L   ALT 21 0 - 32 IU/L  CBC with Differential/Platelet  Result Value Ref Range   WBC 5.4 3.4 - 10.8 x10E3/uL   RBC 4.88 3.77 - 5.28 x10E6/uL   Hemoglobin 13.3 11.1 - 15.9 g/dL   Hematocrit 40.4 34.0 - 46.6 %   MCV 83 79 - 97 fL   MCH 27.3 26.6 - 33.0 pg   MCHC 32.9 31.5 - 35.7 g/dL   RDW 12.0 11.7 - 15.4 %   Platelets 281 150 - 450 x10E3/uL   Neutrophils 45 Not Estab. %   Lymphs 45 Not Estab. %   Monocytes 7 Not Estab. %   Eos 2 Not Estab. %   Basos 1 Not Estab. %   Neutrophils Absolute 2.4 1.4 - 7.0 x10E3/uL   Lymphocytes Absolute 2.4 0.7 - 3.1 x10E3/uL   Monocytes Absolute 0.4 0.1 - 0.9 x10E3/uL   EOS (ABSOLUTE) 0.1 0.0 - 0.4 x10E3/uL   Basophils Absolute 0.0 0.0 - 0.2 x10E3/uL   Immature Granulocytes 0 Not Estab. %   Immature Grans (Abs) 0.0 0.0 - 0.1 x10E3/uL    Assessment & Plan    1. Annual physical exam   2. Type 2 diabetes mellitus without complication, without long-term current use of insulin (HCC)  Controlled, continue current medications.   - POCT glycosylated hemoglobin (Hb A1C) - TSH - Lipid panel - Comprehensive metabolic panel - CBC  with Differential/Platelet - amLODipine (NORVASC) 10 MG tablet; Take 1 tablet (10 mg total) by mouth daily.  Dispense: 90 tablet; Refill: 1 - metFORMIN (GLUCOPHAGE) 1000 MG tablet; Take 1 tablet (1,000 mg total) by mouth 2 (two) times daily with a meal.  Dispense: 180 tablet; Refill: 0  3. Polyp of colon, unspecified part of colon, unspecified type  - Ambulatory referral to Gastroenterology  4. Need for influenza vaccination   5. Encounter for screening mammogram for malignant neoplasm of breast  - MM Digital Screening; Future  6. 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  Dispense: 180 tablet; Refill: 0  7. Hypercholesterolemia  - atorvastatin (LIPITOR) 20 MG tablet; Take 1 tablet (20 mg total) by mouth daily.  Dispense: 90 tablet; Refill: 1  8. Hypertension associated with diabetes ([place])  - amLODipine (NORVASC) 10 MG tablet; Take 1 tablet (10 mg total) by mouth daily.  Dispense: 90 tablet; Refill: 1 - lisinopril-hydrochlorothiazide (ZESTORETIC) 20-25 MG tablet; Take 1 tablet by mouth daily.  Dispense: 90 tablet; Refill: 1  9. Bronchitis  - albuterol (VENTOLIN HFA) 108 (90 Base) MCG/ACT inhaler; Inhale 2 puffs into the lungs every 6 (six) hours as needed for wheezing or shortness of breath.  Dispense: 1 each; Refill: 2  10. Flu vaccine need  - Flu Vaccine QUAD 36+ mos IM  11. Chronic pain of right knee  - traMADol (ULTRAM) 50 MG tablet; TAKE 1 TABLET BY MOUTH EVERY 8 HOURS AS NEEDED FOR SEVERE PAIN  Dispense: 15 tablet; Refill: 0  12. Class 2 severe obesity due to excess calories with serious comorbidity and body mass index (BMI) of 39.0 to  39.9 in adult [facility])  Discussed importance of healthy weight management Discussed diet and exercise   No follow-ups on file.      ITrinna Post,  PA-C, have reviewed all documentation for this visit. The documentation on 01/02/21 for the exam, diagnosis, procedures, and orders are all accurate and complete.  The entirety of the information documented in the History of Present Illness, Review of Systems and Physical Exam were personally obtained by me. Portions of this information were initially documented by [facility] and reviewed by me for thoroughness and accuracy.     Paulene Floor  [facility] [phone] (phone) [phone] (fax)  [place]

## 2020-12-29 LAB — CBC WITH DIFFERENTIAL/PLATELET
Basophils Absolute: 0 10*3/uL (ref 0.0–0.2)
Basos: 1 %
EOS (ABSOLUTE): 0.1 10*3/uL (ref 0.0–0.4)
Eos: 2 %
Hematocrit: 40.4 % (ref 34.0–46.6)
Hemoglobin: 13.3 g/dL (ref 11.1–15.9)
Immature Grans (Abs): 0 10*3/uL (ref 0.0–0.1)
Immature Granulocytes: 0 %
Lymphocytes Absolute: 2.4 10*3/uL (ref 0.7–3.1)
Lymphs: 45 %
MCH: 27.3 pg (ref 26.6–33.0)
MCHC: 32.9 g/dL (ref 31.5–35.7)
MCV: 83 fL (ref 79–97)
Monocytes Absolute: 0.4 10*3/uL (ref 0.1–0.9)
Monocytes: 7 %
Neutrophils Absolute: 2.4 10*3/uL (ref 1.4–7.0)
Neutrophils: 45 %
Platelets: 281 10*3/uL (ref 150–450)
RBC: 4.88 x10E6/uL (ref 3.77–5.28)
RDW: 12 % (ref 11.7–15.4)
WBC: 5.4 10*3/uL (ref 3.4–10.8)

## 2020-12-29 LAB — COMPREHENSIVE METABOLIC PANEL
ALT: 21 IU/L (ref 0–32)
AST: 22 IU/L (ref 0–40)
Albumin/Globulin Ratio: 1 — ABNORMAL LOW (ref 1.2–2.2)
Albumin: 4.2 g/dL (ref 3.8–4.8)
Alkaline Phosphatase: 85 IU/L (ref 44–121)
BUN/Creatinine Ratio: 13 (ref 12–28)
BUN: 10 mg/dL (ref 8–27)
Bilirubin Total: 0.3 mg/dL (ref 0.0–1.2)
CO2: 24 mmol/L (ref 20–29)
Calcium: 9.8 mg/dL (ref 8.7–10.3)
Chloride: 99 mmol/L (ref 96–106)
Creatinine, Ser: 0.77 mg/dL (ref 0.57–1.00)
GFR calc Af Amer: 96 mL/min/{1.73_m2} (ref >59)
GFR calc non Af Amer: 83 mL/min/{1.73_m2} (ref >59)
Globulin, Total: 4.1 g/dL (ref 1.5–4.5)
Glucose: 101 mg/dL — ABNORMAL HIGH (ref 65–99)
Potassium: 4.2 mmol/L (ref 3.5–5.2)
Sodium: 138 mmol/L (ref 134–144)
Total Protein: 8.3 g/dL (ref 6.0–8.5)

## 2020-12-29 LAB — TSH: TSH: 0.748 u[IU]/mL (ref 0.450–4.500)

## 2020-12-29 LAB — LIPID PANEL
Chol/HDL Ratio: 3.8 ratio (ref 0.0–4.4)
Cholesterol, Total: 145 mg/dL (ref 100–199)
HDL: 38 mg/dL — ABNORMAL LOW (ref >39)
LDL Chol Calc (NIH): 87 mg/dL (ref 0–99)
Triglycerides: 106 mg/dL (ref 0–149)
VLDL Cholesterol Cal: 20 mg/dL (ref 5–40)

## 2020-12-29 MED ORDER — TRAMADOL HCL 50 MG PO TABS: ORAL_TABLET | ORAL | 0 refills | Status: DC

## 2021-01-02 ENCOUNTER — Telehealth: Payer: Self-pay

## 2021-01-02 LAB — POCT GLYCOSYLATED HEMOGLOBIN (HGB A1C): Hemoglobin A1C: 6.7 % — AB (ref 4.0–5.6)

## 2021-01-02 NOTE — Telephone Encounter (Signed)
-----   Message from Trinna Post, [place] sent at 01/02/2021 12:11 PM EST ----- Cholesterol improved, remaining labs stable. Will see her at follow up.

## 2021-01-02 NOTE — Telephone Encounter (Signed)
Called patient and no answer. LVMTRC, if patient calls back okay for P[facility] to advise. 

## 2021-01-18 ENCOUNTER — Encounter: Payer: Self-pay | Admitting: *Deleted

## 2021-01-23 ENCOUNTER — Other Ambulatory Visit: Payer: Self-pay | Admitting: Physician Assistant

## 2021-01-23 DIAGNOSIS — G4700 Insomnia, unspecified: Secondary | ICD-10-CM

## 2021-01-26 ENCOUNTER — Other Ambulatory Visit: Payer: Self-pay | Admitting: Physician Assistant

## 2021-01-26 DIAGNOSIS — Z1231 Encounter for screening mammogram for malignant neoplasm of breast: Secondary | ICD-10-CM

## 2021-02-12 ENCOUNTER — Telehealth: Payer: BC Managed Care – PPO

## 2021-02-12 ENCOUNTER — Encounter: Payer: Self-pay | Admitting: *Deleted

## 2021-02-12 ENCOUNTER — Telehealth: Payer: Self-pay

## 2021-02-12 NOTE — Telephone Encounter (Signed)
Unable to contact pt for 9am triage call.  LVM at 9am and 9:05am for her to call front office to reschedule her triage call.  Thanks,  [place], [place]

## 2021-02-14 ENCOUNTER — Other Ambulatory Visit: Payer: Self-pay

## 2021-02-14 ENCOUNTER — Telehealth (INDEPENDENT_AMBULATORY_CARE_PROVIDER_SITE_OTHER): Payer: BC Managed Care – PPO | Admitting: Gastroenterology

## 2021-02-14 DIAGNOSIS — Z86010 Personal history of colonic polyps: Secondary | ICD-10-CM

## 2021-02-14 MED ORDER — NA SULFATE-K SULFATE-MG SULF 17.5-3.13-1.6 GM/177ML PO SOLN: 1.0000 | Freq: Once | ORAL | 0 refills | Status: AC

## 2021-02-14 NOTE — Progress Notes (Signed)
Gastroenterology Pre-Procedure Review  Request Date: Tuesday 02/27/21 Requesting Physician: Dr. Allen Norris  PATIENT REVIEW QUESTIONS: The patient responded to the following health history questions as indicated:    1. Are you having any GI issues? no 2. Do you have a personal history of Polyps? yes (11/28/15 colonoscopy performed by Dr. Allen Norris) 3. Do you have a family history of Colon Cancer or Polyps? no 4. Diabetes Mellitus? yes (type 2) 5. Joint replacements in the past 12 months?no 6. Major health problems in the past 3 months?no 7. Any artificial heart valves, MVP, or defibrillator?no    MEDICATIONS & ALLERGIES:    Patient reports the following regarding taking any anticoagulation/antiplatelet therapy:   Plavix, Coumadin, Eliquis, Xarelto, Lovenox, Pradaxa, Brilinta, or Effient? no Aspirin? no  Patient confirms/reports the following medications:  Current Outpatient Medications  Medication Sig Dispense Refill  . albuterol (VENTOLIN HFA) 108 (90 Base) MCG/ACT inhaler Inhale 2 puffs into the lungs every 6 (six) hours as needed for wheezing or shortness of breath. 1 each 2  . amLODipine (NORVASC) 10 MG tablet Take 1 tablet (10 mg total) by mouth daily. 90 tablet 1  . atorvastatin (LIPITOR) 20 MG tablet Take 1 tablet (20 mg total) by mouth daily. 90 tablet 1  . diclofenac Sodium (VOLTAREN) 1 % GEL Apply 4 g topically 4 (four) times daily. 150 g 0  . Dulaglutide (TRULICITY) 1.5 EH/6.3JS SOPN Inject 1.5 mg into the skin once a week. 12 pen 0  . gabapentin (NEURONTIN) 600 MG tablet Take 2 tablets (1,200 mg total) by mouth at bedtime. 180 tablet 0  . gentamicin cream (GARAMYCIN) 0.1 % Apply 1 application topically 2 (two) times daily. 15 g 1  . glucose blood test strip Use as instructed 100 each 12  . lisinopril-hydrochlorothiazide (ZESTORETIC) 20-25 MG tablet Take 1 tablet by mouth daily. 90 tablet 1  . metFORMIN (GLUCOPHAGE) 1000 MG tablet Take 1 tablet (1,000 mg total) by mouth 2 (two) times  daily with a meal. 180 tablet 0  . Multiple Vitamin (MULTIVITAMIN) capsule Take 1 capsule by mouth daily.    . traMADol (ULTRAM) 50 MG tablet TAKE 1 TABLET BY MOUTH EVERY 8 HOURS AS NEEDED FOR SEVERE PAIN 15 tablet 0  . traZODone (DESYREL) 100 MG tablet TAKE 1 TABLET BY MOUTH AT BEDTIME 30 tablet 0  . levocetirizine (XYZAL) 5 MG tablet Take 1 tablet (5 mg total) by mouth every evening. 90 tablet 0   No current facility-administered medications for this visit.    Patient confirms/reports the following allergies:  Allergies  Allergen Reactions  . Mucinex [Guaifenesin Er] Other (See Comments)    syncope    No orders of the defined types were placed in this encounter.   AUTHORIZATION INFORMATION Primary Insurance: 1D#: Group #:  Secondary Insurance: 1D#: Group #:  SCHEDULE INFORMATION: Date: 02/27/21 Time: Location:[facility]

## 2021-02-15 ENCOUNTER — Other Ambulatory Visit: Payer: Self-pay

## 2021-02-15 ENCOUNTER — Ambulatory Visit
Admission: RE | Admit: 2021-02-15 | Discharge: 2021-02-15 | Disposition: A | Discharge status: Home / Self Care | Payer: BC Managed Care – PPO | Source: Ambulatory Visit | Attending: Physician Assistant | Admitting: Physician Assistant

## 2021-02-15 DIAGNOSIS — Z1231 Encounter for screening mammogram for malignant neoplasm of breast: Secondary | ICD-10-CM | POA: Insufficient documentation

## 2021-02-19 ENCOUNTER — Telehealth: Payer: BC Managed Care – PPO

## 2021-02-21 ENCOUNTER — Telehealth: Payer: Self-pay

## 2021-02-21 NOTE — Telephone Encounter (Signed)
-----   Message from Mar Daring, [place] sent at 02/21/2021  9:27 AM EDT ----- Normal mammogram. Repeat screening in one year.

## 2021-02-21 NOTE — Telephone Encounter (Signed)
Called patient and no answer or able to [facility] due to mailbox full. Will try patient at a later time.

## 2021-02-22 NOTE — Telephone Encounter (Signed)
Patient advised.

## 2021-02-23 ENCOUNTER — Other Ambulatory Visit: Payer: Self-pay

## 2021-02-23 ENCOUNTER — Other Ambulatory Visit
Admission: RE | Admit: 2021-02-23 | Discharge: 2021-02-23 | Disposition: A | Discharge status: Home / Self Care | Payer: BC Managed Care – PPO | Source: Ambulatory Visit | Attending: Gastroenterology | Admitting: Gastroenterology

## 2021-02-23 DIAGNOSIS — Z01812 Encounter for preprocedural laboratory examination: Secondary | ICD-10-CM | POA: Diagnosis not present

## 2021-02-23 DIAGNOSIS — Z20822 Contact with and (suspected) exposure to covid-19: Secondary | ICD-10-CM | POA: Insufficient documentation

## 2021-02-23 LAB — SARS CORONAVIRUS 2 (TAT 6-24 HRS): SARS Coronavirus 2: NEGATIVE

## 2021-02-27 ENCOUNTER — Encounter: Payer: Self-pay | Admitting: Gastroenterology

## 2021-02-27 ENCOUNTER — Other Ambulatory Visit: Payer: Self-pay

## 2021-02-27 ENCOUNTER — Ambulatory Visit: Payer: BC Managed Care – PPO | Admitting: Anesthesiology

## 2021-02-27 ENCOUNTER — Encounter
Admission: RE | Disposition: A | Discharge status: Home / Self Care | Payer: Self-pay | Source: Home / Self Care | Attending: Gastroenterology

## 2021-02-27 ENCOUNTER — Ambulatory Visit
Admission: RE | Admit: 2021-02-27 | Discharge: 2021-02-27 | Disposition: A | Discharge status: Home / Self Care | Payer: BC Managed Care – PPO | Attending: Gastroenterology | Admitting: Gastroenterology

## 2021-02-27 DIAGNOSIS — K635 Polyp of colon: Secondary | ICD-10-CM

## 2021-02-27 DIAGNOSIS — Z800 Family history of malignant neoplasm of digestive organs: Secondary | ICD-10-CM | POA: Diagnosis not present

## 2021-02-27 DIAGNOSIS — K5730 Diverticulosis of large intestine without perforation or abscess without bleeding: Secondary | ICD-10-CM | POA: Diagnosis not present

## 2021-02-27 DIAGNOSIS — I10 Essential (primary) hypertension: Secondary | ICD-10-CM | POA: Diagnosis not present

## 2021-02-27 DIAGNOSIS — Z79899 Other long term (current) drug therapy: Secondary | ICD-10-CM | POA: Diagnosis not present

## 2021-02-27 DIAGNOSIS — Z86010 Personal history of colonic polyps: Secondary | ICD-10-CM

## 2021-02-27 DIAGNOSIS — D123 Benign neoplasm of transverse colon: Secondary | ICD-10-CM | POA: Insufficient documentation

## 2021-02-27 DIAGNOSIS — Z7951 Long term (current) use of inhaled steroids: Secondary | ICD-10-CM | POA: Diagnosis not present

## 2021-02-27 DIAGNOSIS — Z888 Allergy status to other drugs, medicaments and biological substances status: Secondary | ICD-10-CM | POA: Insufficient documentation

## 2021-02-27 DIAGNOSIS — Z841 Family history of disorders of kidney and ureter: Secondary | ICD-10-CM | POA: Diagnosis not present

## 2021-02-27 DIAGNOSIS — K640 First degree hemorrhoids: Secondary | ICD-10-CM | POA: Diagnosis not present

## 2021-02-27 DIAGNOSIS — Z833 Family history of diabetes mellitus: Secondary | ICD-10-CM | POA: Diagnosis not present

## 2021-02-27 DIAGNOSIS — K5790 Diverticulosis of intestine, part unspecified, without perforation or abscess without bleeding: Secondary | ICD-10-CM | POA: Diagnosis not present

## 2021-02-27 DIAGNOSIS — Z8249 Family history of ischemic heart disease and other diseases of the circulatory system: Secondary | ICD-10-CM | POA: Diagnosis not present

## 2021-02-27 DIAGNOSIS — Z1211 Encounter for screening for malignant neoplasm of colon: Secondary | ICD-10-CM | POA: Diagnosis not present

## 2021-02-27 DIAGNOSIS — Z8042 Family history of malignant neoplasm of prostate: Secondary | ICD-10-CM | POA: Diagnosis not present

## 2021-02-27 DIAGNOSIS — Z791 Long term (current) use of non-steroidal anti-inflammatories (NSAID): Secondary | ICD-10-CM | POA: Insufficient documentation

## 2021-02-27 DIAGNOSIS — E119 Type 2 diabetes mellitus without complications: Secondary | ICD-10-CM | POA: Insufficient documentation

## 2021-02-27 DIAGNOSIS — Z7984 Long term (current) use of oral hypoglycemic drugs: Secondary | ICD-10-CM | POA: Diagnosis not present

## 2021-02-27 HISTORY — PX: COLONOSCOPY WITH PROPOFOL: SHX5780

## 2021-02-27 HISTORY — DX: Hyperlipidemia, unspecified: E78.5

## 2021-02-27 LAB — GLUCOSE, CAPILLARY: Glucose-Capillary: 96 mg/dL (ref 70–99)

## 2021-02-27 SURGERY — COLONOSCOPY WITH PROPOFOL
Anesthesia: General

## 2021-02-27 MED ORDER — PROPOFOL 10 MG/ML IV BOLUS
INTRAVENOUS | Status: DC | PRN
  Administered 2021-02-27: 90 mg via INTRAVENOUS

## 2021-02-27 MED ORDER — PROPOFOL 500 MG/50ML IV EMUL
INTRAVENOUS | Status: AC
  Filled 2021-02-27: qty 50

## 2021-02-27 MED ORDER — PROPOFOL 500 MG/50ML IV EMUL
INTRAVENOUS | Status: DC | PRN
  Administered 2021-02-27: 150 ug/kg/min via INTRAVENOUS

## 2021-02-27 MED ORDER — SODIUM CHLORIDE 0.9 % IV SOLN: INTRAVENOUS | Status: DC

## 2021-02-27 NOTE — Anesthesia Preprocedure Evaluation (Signed)
Anesthesia Evaluation  Patient identified by MRN, date of birth, ID band Patient awake    Reviewed: Allergy & Precautions, NPO status , Patient's Chart, lab work & pertinent test results  History of Anesthesia Complications Negative for: history of anesthetic complications  Airway Mallampati: II  TM Distance: >3 FB Neck ROM: Full    Dental  (+) Poor Dentition   Pulmonary neg pulmonary ROS, neg sleep apnea, neg COPD,    breath sounds clear to auscultation- rhonchi (-) wheezing      Cardiovascular Exercise Tolerance: Good hypertension, Pt. on medications (-) CAD, (-) Past MI, (-) Cardiac Stents and (-) CABG  Rhythm:Regular Rate:Normal - Systolic murmurs and - Diastolic murmurs    Neuro/Psych neg Seizures negative neurological ROS  negative psych ROS   GI/Hepatic negative GI ROS, Neg liver ROS,   Endo/Other  diabetes  Renal/GU negative Renal ROS     Musculoskeletal negative musculoskeletal ROS (+)   Abdominal (+) + obese,   Peds  Hematology  (+) anemia ,   Anesthesia Other Findings Past Medical History: No date: Diabetes mellitus without complication (HCC) No date: Hyperlipemia No date: Hypertension No date: Neuromuscular disorder (HCC) No date: Obesity   Reproductive/Obstetrics                             Anesthesia Physical Anesthesia Plan  ASA: III  Anesthesia Plan: General   Post-op Pain Management:    Induction: Intravenous  PONV Risk Score and Plan: 2 and Propofol infusion  Airway Management Planned: Natural Airway  Additional Equipment:   Intra-op Plan:   Post-operative Plan:   Informed Consent: I have reviewed the patients History and Physical, chart, labs and discussed the procedure including the risks, benefits and alternatives for the proposed anesthesia with the patient or authorized representative who has indicated his/her understanding and acceptance.      Dental advisory given  Plan Discussed with: CRNA and Anesthesiologist  Anesthesia Plan Comments:         Anesthesia Quick Evaluation

## 2021-02-27 NOTE — Anesthesia Postprocedure Evaluation (Signed)
Anesthesia Post Note  Patient: Julia Robertson  Procedure(s) Performed: COLONOSCOPY WITH PROPOFOL (N/A )  Patient location during evaluation: Endoscopy Anesthesia Type: General Level of consciousness: awake and alert and oriented Pain management: pain level controlled Vital Signs Assessment: post-procedure vital signs reviewed and stable Respiratory status: spontaneous breathing, nonlabored ventilation and respiratory function stable Cardiovascular status: blood pressure returned to baseline and stable Postop Assessment: no signs of nausea or vomiting Anesthetic complications: no   No complications documented.   Last Vitals:  Vitals:   02/27/21 0851 02/27/21 0901  BP: (!) 161/98 (!) 171/99  Pulse:    Resp:    Temp:    SpO2:      Last Pain:  Vitals:   02/27/21 0911  TempSrc:   PainSc: 0-No pain                 Natalie Mceuen

## 2021-02-27 NOTE — H&P (Signed)
Julia Lame, MD [address] [place], [place] [address] [phone] Fax : [phone]  Primary Care Physician:  Paulene Floor Primary Gastroenterologist:  Dr. Allen Norris  Pre-Procedure History & Physical: HPI:  Julia Robertson is a 65 y.o. female is here for an colonoscopy.   Past Medical History:  Diagnosis Date  . Diabetes mellitus without complication ([place])   . Hypertension   . Neuromuscular disorder ([place])   . Obesity     Past Surgical History:  Procedure Laterality Date  . CHOLECYSTECTOMY    . COLONOSCOPY WITH PROPOFOL N/A 11/28/2015   Procedure: COLONOSCOPY WITH PROPOFOL;  Surgeon: Julia Lame, MD;  Location: [facility] ENDOSCOPY;  Service: Endoscopy;  Laterality: N/A;  . 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 Rudene [facility] at [facility]    Prior to Admission medications   Medication Sig Start Date End Date Taking? Authorizing Provider  albuterol (VENTOLIN HFA) 108 (90 Base) MCG/ACT inhaler Inhale 2 puffs into the lungs every 6 (six) hours as needed for wheezing or shortness of breath. 12/28/20   Trinna Post, PA-C  amLODipine (NORVASC) 10 MG tablet Take 1 tablet (10 mg total) by mouth daily. 12/28/20   Trinna Post, PA-C  atorvastatin (LIPITOR) 20 MG tablet Take 1 tablet (20 mg total) by mouth daily. 12/28/20   Trinna Post, PA-C  diclofenac Sodium (VOLTAREN) 1 % GEL Apply 4 g topically 4 (four) times daily. 12/15/19   Trinna Post, PA-C  Dulaglutide (TRULICITY) 1.5 KD/3.2IZ SOPN Inject 1.5 mg into the skin once a week. 12/15/19   Trinna Post, PA-C  gabapentin (NEURONTIN) 600 MG tablet Take 2 tablets (1,200 mg total) by mouth at bedtime. 12/28/20 03/28/21  Trinna Post, PA-C  gentamicin cream (GARAMYCIN) 0.1 % Apply 1 application topically 2 (two) times daily. 07/04/20   Edrick Kins, DPM  glucose blood test strip Use as instructed 11/23/18   Trinna Post, PA-C  levocetirizine (XYZAL) 5 MG tablet Take 1 tablet (5 mg total) by mouth  every evening. 04/13/20 07/12/20  Trinna Post, PA-C  lisinopril-hydrochlorothiazide (ZESTORETIC) 20-25 MG tablet Take 1 tablet by mouth daily. 12/28/20   Trinna Post, PA-C  metFORMIN (GLUCOPHAGE) 1000 MG tablet Take 1 tablet (1,000 mg total) by mouth 2 (two) times daily with a meal. 12/28/20   Trinna Post, PA-C  Multiple Vitamin (MULTIVITAMIN) capsule Take 1 capsule by mouth daily.    [provider]  traMADol (ULTRAM) 50 MG tablet TAKE 1 TABLET BY MOUTH EVERY 8 HOURS AS NEEDED FOR SEVERE PAIN 12/29/20   Trinna Post, PA-C  traZODone (DESYREL) 100 MG tablet TAKE 1 TABLET BY MOUTH AT BEDTIME 01/23/21   Trinna Post, PA-C    Allergies as of 02/14/2021 - Review Complete 02/14/2021  Allergen Reaction Noted  . Mucinex [guaifenesin er] Other (See Comments) 04/08/2019    Family History  Problem Relation Age of Onset  . Cancer Mother        pancreatic  . Cancer Father        prostate  . Hypertension Sister   . Hypertension Brother   . Kidney disease Brother   . Hypertension Brother   . Kidney disease Brother   . Hypertension Brother   . Diabetes Brother   . Hypertension Brother   . Breast cancer Neg Hx     Social History   Socioeconomic History  . Marital status: Married    Spouse name: Not on  file  . Number of children: Not on file  . Years of education: Not on file  . Highest education level: Not on file  Occupational History  . 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 Smoking status: Never Smoker  . Smokeless tobacco: Never Used  Substance and Sexual Activity  . Alcohol use: No  . Drug use: No  . Sexual activity: Not on file  Other Topics Concern  . Not on file  Social History Narrative  . Not on file   Social Determinants of Health   Financial Resource Strain: Not on file  Food Insecurity: Not on file  Transportation Needs: Not on file  Physical Activity: Not on file  Stress: Not on  file  Social Connections: Not on file  Intimate Partner Violence: Not on file    Review of Systems: See HPI, otherwise negative ROS  Physical Exam: There were no vitals taken for this visit. General:   Alert,  pleasant and cooperative in NAD Head:  Normocephalic and atraumatic. Neck:  Supple; no masses or thyromegaly. Lungs:  Clear throughout to auscultation.    Heart:  Regular rate and rhythm. Abdomen:  Soft, nontender and nondistended. Normal bowel sounds, without guarding, and without rebound.   Neurologic:  Alert and  oriented x4;  grossly normal neurologically.  Impression/Plan: [name] is here for an colonoscopy to be performed for a history of adenomatous polyps on 11/2015   Risks, benefits, limitations, and alternatives regarding  colonoscopy have been reviewed with the patient.  Questions have been answered.  All parties agreeable.   Julia Lame, MD  02/27/2021, 7:38 AM

## 2021-02-27 NOTE — Op Note (Signed)
[facility] Patient Name: Julia Robertson Procedure Date: 02/27/2021 8:13 AM MRN: [id number] Account #: [id number] Date of Birth: May 18, 1956 Admit Type: Outpatient Age: 65 Room: [facility] ENDO ROOM 4 Gender: Female Note Status: Finalized Procedure:             Colonoscopy Indications:           High risk colon cancer surveillance: Personal history                         of colonic polyps Providers:             Lucilla Lame MD, MD Medicines:             Propofol per Anesthesia Complications:         No immediate complications. Procedure:             Pre-Anesthesia Assessment:                        - Prior to the procedure, a History and Physical was                         performed, and patient medications and allergies were                         reviewed. The patient's tolerance of previous                         anesthesia was also reviewed. The risks and benefits                         of the procedure and the sedation options and risks                         were discussed with the patient. All questions were                         answered, and informed consent was obtained. Prior                         Anticoagulants: The patient has taken no previous                         anticoagulant or antiplatelet agents. ASA Grade                         Assessment: II - A patient with mild systemic disease.                         After reviewing the risks and benefits, the patient                         was deemed in satisfactory condition to undergo the                         procedure.                        After obtaining informed consent, the colonoscope was  passed under direct vision. Throughout the procedure,                         the patient's blood pressure, pulse, and oxygen                         saturations were monitored continuously. The                         Colonoscope was introduced through the  anus and                         advanced to the the cecum, identified by appendiceal                         orifice and ileocecal valve. The colonoscopy was                         performed without difficulty. The patient tolerated                         the procedure well. The quality of the bowel                         preparation was excellent. Findings:      The perianal and digital rectal examinations were normal.      A 3 mm polyp was found in the transverse colon. The polyp was sessile.       The polyp was removed with a cold biopsy forceps. Resection and       retrieval were complete.      A few small-mouthed diverticula were found in the sigmoid colon.      Non-bleeding internal hemorrhoids were found during retroflexion. The       hemorrhoids were Grade I (internal hemorrhoids that do not prolapse). Impression:            - One 3 mm polyp in the transverse colon, removed with                         a cold biopsy forceps. Resected and retrieved.                        - Diverticulosis in the sigmoid colon.                        - Non-bleeding internal hemorrhoids. Recommendation:        - Discharge patient to home.                        - Resume previous diet.                        - Continue present medications.                        - Await pathology results.                        - Repeat colonoscopy in 7 years for surveillance. Procedure Code(s):     --- Professional ---  45380, Colonoscopy, flexible; with biopsy, single or                         multiple Diagnosis Code(s):     --- Professional ---                        Z86.010, Personal history of colonic polyps                        K63.5, Polyp of colon CPT copyright 2019 American Medical Association. All rights reserved. The codes documented in this report are preliminary and upon coder review may  be revised to meet current compliance requirements. Lucilla Lame MD, MD 02/27/2021  8:37:49 AM This report has been signed electronically. Number of Addenda: 0 Note Initiated On: 02/27/2021 8:13 AM Scope Withdrawal Time: 0 hours 7 minutes 54 seconds  Total Procedure Duration: 0 hours 10 minutes 42 seconds  Estimated Blood Loss:  Estimated blood loss: none.      [facility]

## 2021-02-27 NOTE — Transfer of Care (Signed)
Immediate Anesthesia Transfer of Care Note  Patient: Julia Robertson  Procedure(s) Performed: COLONOSCOPY WITH PROPOFOL (N/A )  Patient Location: Endoscopy Unit  Anesthesia Type:General  Level of Consciousness: drowsy  Airway & Oxygen Therapy: Patient Spontanous Breathing  Post-op Assessment: Report given to RN and Post -op Vital signs reviewed and stable  Post vital signs: Reviewed and stable  Last Vitals:  Vitals Value Taken Time  BP 126/73 02/27/21 0841  Temp 36.3 C 02/27/21 0841  Pulse 73 02/27/21 0841  Resp 26 02/27/21 0841  SpO2 100 % 02/27/21 0841  Vitals shown include unvalidated device data.  Last Pain:  Vitals:   02/27/21 0841  TempSrc: Temporal  PainSc: 0-No pain         Complications: No complications documented.

## 2021-02-28 ENCOUNTER — Encounter: Payer: Self-pay | Admitting: Gastroenterology

## 2021-02-28 LAB — SURGICAL PATHOLOGY

## 2021-03-13 ENCOUNTER — Other Ambulatory Visit: Payer: Self-pay | Admitting: Physician Assistant

## 2021-03-13 DIAGNOSIS — E0843 Diabetes mellitus due to underlying condition with diabetic autonomic (poly)neuropathy: Secondary | ICD-10-CM

## 2021-03-13 DIAGNOSIS — G4700 Insomnia, unspecified: Secondary | ICD-10-CM

## 2021-03-13 NOTE — Telephone Encounter (Signed)
Requested Prescriptions  Pending Prescriptions Disp Refills  . traZODone (DESYREL) 100 MG tablet [Pharmacy Med Name: traZODone HCl 100 MG Oral Tablet] 90 tablet 1    Sig: TAKE 1 TABLET BY MOUTH AT BEDTIME     Psychiatry: Antidepressants - Serotonin Modulator Passed - 03/13/2021  5:18 PM      Passed - Valid encounter within last 6 months    Recent Outpatient Visits          2 months ago Annual physical exam   [facility] Carles Collet M, PA-C   11 months ago Allergic rhinitis due to pollen, unspecified seasonality   [place], [place], PA-C   12 months ago Type 2 diabetes mellitus without complication, without long-term current use of insulin [facility])   [facility] [place], [place], PA-C   1 year ago Type 2 diabetes mellitus without complication, without long-term current use of insulin [facility])   [facility] [place], [place], [place]   1 year ago Type 2 diabetes mellitus without complication, without long-term current use of insulin [facility])   [facility] [place], Wendee Bea[place], [place]      Future Appointments            In 3 months Terrilee Croak, [place], PA-C [employer]           Refused Prescriptions Disp Refills  . gabapentin (NEURONTIN) 600 MG tablet [Pharmacy Med Name: Gabapentin 600 MG Oral Tablet] 180 tablet 0    Sig: TAKE 2 TABLETS BY MOUTH AT BEDTIME     Neurology: Anticonvulsants - gabapentin Passed - 03/13/2021  5:18 PM      Passed - Valid encounter within last 12 months    Recent Outpatient Visits          2 months ago Annual physical exam   [facility] Carles Collet M, PA-C   11 months ago Allergic rhinitis due to pollen, unspecified seasonality   [place], [place], PA-C   12 months ago Type 2 diabetes mellitus without complication, without long-term current use of insulin [facility])   [facility] [place], [place], PA-C    1 year ago Type 2 diabetes mellitus without complication, without long-term current use of insulin [facility])   [facility] [place], [place], [place]   1 year ago Type 2 diabetes mellitus without complication, without long-term current use of insulin [facility])   [facility] [place], Wendee Beavers, [place]      Future Appointments            In 3 months Terrilee Croak, Wendee [place], PA-C [employer]

## 2021-03-15 ENCOUNTER — Other Ambulatory Visit: Payer: Self-pay

## 2021-04-17 ENCOUNTER — Other Ambulatory Visit: Payer: Self-pay

## 2021-04-17 DIAGNOSIS — E119 Type 2 diabetes mellitus without complications: Secondary | ICD-10-CM

## 2021-04-17 MED ORDER — TRULICITY 1.5 MG/0.5ML ~~LOC~~ SOAJ: 1.5000 mg | SUBCUTANEOUS | 3 refills | Status: DC

## 2021-04-17 NOTE — Telephone Encounter (Signed)
[facility] faxed refill request for the following medications:  Dulaglutide (TRULICITY) 1.5 TW/4.4QK SOPN   Please advise.

## 2021-04-26 ENCOUNTER — Other Ambulatory Visit: Payer: Self-pay | Admitting: Physician Assistant

## 2021-04-26 DIAGNOSIS — E0843 Diabetes mellitus due to underlying condition with diabetic autonomic (poly)neuropathy: Secondary | ICD-10-CM

## 2021-04-26 MED ORDER — GABAPENTIN 600 MG PO TABS: 1200.0000 mg | ORAL_TABLET | Freq: Every day | ORAL | 0 refills | Status: DC

## 2021-04-26 NOTE — Telephone Encounter (Signed)
Patient called and advised she will need to schedule with a new provider due to Fabio Bering is no longer with [facility]. She agrees. She says she is out of Gabapentin for 3 weeks. I advised she will receive a call from the scheduler when an appointment can be scheduled, patient verbalized understanding.

## 2021-04-26 NOTE — Telephone Encounter (Signed)
Requested medication (s) are due for refill today: Yes  Requested medication (s) are on the active medication list: Yes  Last refill:  12/28/20  Future visit scheduled: No  Notes to clinic: Unable to refill per protocol, last refill by another provider. Will need appointment with new provider.      Requested Prescriptions  Pending Prescriptions Disp Refills   gabapentin (NEURONTIN) 600 MG tablet 180 tablet 0    Sig: Take 2 tablets (1,200 mg total) by mouth at bedtime.      Neurology: Anticonvulsants - gabapentin Passed - 04/26/2021  2:44 PM      Passed - Valid encounter within last 12 months    Recent Outpatient Visits           3 months ago Annual physical exam   [facility] Carles Collet M, [place]   1 year ago Allergic rhinitis due to pollen, unspecified seasonality   [place], [place], PA-C   1 year ago Type 2 diabetes mellitus without complication, without long-term current use of insulin [facility])   [facility] [place], [place], [place]   1 year ago Type 2 diabetes mellitus without complication, without long-term current use of insulin [facility])   [facility] [place], [place], [place]   1 year ago Type 2 diabetes mellitus without complication, without long-term current use of insulin [facility])   [facility] [place]na [place], [place]       Future Appointments             In 2 months [place], PA-C [employer], [facility]

## 2021-04-26 NOTE — Telephone Encounter (Signed)
Medication Refill - Medication: gabapentin (NEURONTIN) 600 MG tablet    Preferred Pharmacy (with phone number or street name): [facility] [place]  Agent: Please be advised that RX refills may take up to 3 business days. We ask that you follow-up with your pharmacy.

## 2021-04-27 ENCOUNTER — Telehealth: Payer: Self-pay

## 2021-04-27 NOTE — Telephone Encounter (Signed)
Copied from [place] [id number]. Topic: General - Other >> Apr 26, 2021  2:14 PM Keene Breath wrote: Reason for CRM: Patient called to ask the nurse to call to let her know her blood type.  Please advise and call to discuss at [phone]

## 2021-04-27 NOTE — Telephone Encounter (Signed)
Patient advised that I don't see blood type documented in chart. She will try to donate blood to find out blood type.

## 2021-05-14 NOTE — Telephone Encounter (Signed)
Noted  

## 2021-07-02 ENCOUNTER — Ambulatory Visit: Payer: Self-pay | Admitting: Physician Assistant

## 2021-07-10 ENCOUNTER — Ambulatory Visit (INDEPENDENT_AMBULATORY_CARE_PROVIDER_SITE_OTHER): Payer: BC Managed Care – PPO | Admitting: Family Medicine

## 2021-07-10 ENCOUNTER — Other Ambulatory Visit: Payer: Self-pay

## 2021-07-10 ENCOUNTER — Encounter: Payer: Self-pay | Admitting: Family Medicine

## 2021-07-10 VITALS — BP 125/82 | HR 70 | Temp 98.1°F | Wt 240.7 lb

## 2021-07-10 DIAGNOSIS — E1169 Type 2 diabetes mellitus with other specified complication: Secondary | ICD-10-CM | POA: Diagnosis not present

## 2021-07-10 DIAGNOSIS — Z862 Personal history of diseases of the blood and blood-forming organs and certain disorders involving the immune mechanism: Secondary | ICD-10-CM

## 2021-07-10 DIAGNOSIS — Z6841 Body Mass Index (BMI) 40.0 and over, adult: Secondary | ICD-10-CM

## 2021-07-10 DIAGNOSIS — I152 Hypertension secondary to endocrine disorders: Secondary | ICD-10-CM

## 2021-07-10 DIAGNOSIS — E785 Hyperlipidemia, unspecified: Secondary | ICD-10-CM

## 2021-07-10 DIAGNOSIS — E119 Type 2 diabetes mellitus without complications: Secondary | ICD-10-CM

## 2021-07-10 DIAGNOSIS — E559 Vitamin D deficiency, unspecified: Secondary | ICD-10-CM

## 2021-07-10 DIAGNOSIS — E1159 Type 2 diabetes mellitus with other circulatory complications: Secondary | ICD-10-CM | POA: Diagnosis not present

## 2021-07-10 LAB — POCT GLYCOSYLATED HEMOGLOBIN (HGB A1C): Hemoglobin A1C: 6 % — AB (ref 4.0–5.6)

## 2021-07-10 MED ORDER — TRULICITY 1.5 MG/0.5ML ~~LOC~~ SOAJ
1.5000 mg | SUBCUTANEOUS | 3 refills | Status: DC
Start: 2021-07-10 — End: 2021-12-05

## 2021-07-10 NOTE — Assessment & Plan Note (Signed)
Well controlled on home readings Continue current medications Recheck metabolic panel F/u in 6 months  

## 2021-07-10 NOTE — Assessment & Plan Note (Signed)
Discussed importance of healthy weight management Discussed diet and exercise  

## 2021-07-10 NOTE — Patient Instructions (Signed)
Try Amlactin cream for your feet

## 2021-07-10 NOTE — Assessment & Plan Note (Signed)
Previously well controlled Continue statin Repeat FLP and CMP Goal LDL < 70 

## 2021-07-10 NOTE — Assessment & Plan Note (Signed)
Well controlled  Continue current medications UTD on vaccines, upcoming eye exam, foot exam today On ACEi On Statin Discussed diet and exercise F/u in 6 months

## 2021-07-10 NOTE — Progress Notes (Signed)
Established patient visit   Patient: Julia Robertson   DOB: 09-Jul-1956   65 y.o. Female  MRN: [id number] Visit Date: 07/10/2021  Today's healthcare provider: Lavon Paganini, MD   Chief Complaint  Patient presents with   Hypertension   Diabetes   Medication Refill     Subjective  -------------------------------------------------------------------------------------------------------------------- Hypertension Pertinent negatives include no chest pain, headaches, neck pain, palpitations or shortness of breath.  Diabetes Pertinent negatives for hypoglycemia include no dizziness or headaches. Pertinent negatives for diabetes include no chest pain, no fatigue and no weakness.  Medication Refill Pertinent negatives include no abdominal pain, chest pain, chills, coughing, fatigue, fever, headaches, myalgias, nausea, neck pain, numbness, sore throat, vomiting or weakness.   Eye Exam  She is scheduled for an eye exam in November. Her last exam was in April.  Dry Feet She wants to see about a cream or lotion for feet. They are constantly dry. This has been going on for a few months.   Hypertension, follow-up  BP Readings from Last 3 Encounters:  02/27/21 (!) 171/99  12/28/20 123/67  03/16/20 138/76   Wt Readings from Last 3 Encounters:  02/27/21 232 lb (105.2 kg)  12/28/20 231 lb 9.6 oz (105.1 kg)  03/16/20 248 lb (112.5 kg)     She was last seen for hypertension 6 months ago.  BP at that visit was 123/67. Management since that visit includes continue current medication.  She reports good compliance with treatment. She is not having side effects.  She is ocassionaly following a Low Sodium diet. She is not exercising. She does not smoke.  Use of agents associated with hypertension: none.   Outside blood pressures are none. Her home blood pressure a typically more normal versus elevated.  Pertinent labs: Lab Results  Component Value Date   CHOL 145  12/28/2020   HDL 38 (L) 12/28/2020   LDLCALC 87 12/28/2020   TRIG 106 12/28/2020   CHOLHDL 3.8 12/28/2020   Lab Results  Component Value Date   NA 138 12/28/2020   K 4.2 12/28/2020   CREATININE 0.77 12/28/2020   GFRNONAA 83 12/28/2020   GFRAA 96 12/28/2020   GLUCOSE 101 (H) 12/28/2020     The 10-year ASCVD risk score Mikey Bussing DC Jr., et al., 2013) is: 31%   --------------------------------------------------------------------------------------------------- Diabetes Mellitus Type II, Follow-up  Lab Results  Component Value Date   HGBA1C 6.7 (A) 01/02/2021   HGBA1C 6.4 (H) 03/16/2020   HGBA1C 6.3 (A) 12/15/2019   Wt Readings from Last 3 Encounters:  02/27/21 232 lb (105.2 kg)  12/28/20 231 lb 9.6 oz (105.1 kg)  03/16/20 248 lb (112.5 kg)   Last seen for diabetes 6 months ago.  Management since then includes continuing same treatment.  Needs a refill on trulicity but otherwise tolerating 1.5 mg trulicity and [id number] mg metformin. She reports good compliance with treatment.  She is not having side effects.   Symptoms: No fatigue No foot ulcerations  No appetite changes No nausea  No paresthesia of the feet  No polydipsia  No polyuria No visual disturbances   No vomiting     Home blood sugar records: fasting range: 100- 500; occassionally 700  Episodes of hypoglycemia? No    Current insulin regiment: none Most Recent Eye Exam: 03/01/2020; upcoming appointment in [place] Current exercise: none Current diet habits: on average, 2 meals per day  Pertinent Labs: Lab Results  Component Value Date   CHOL 145 12/28/2020  HDL 38 (L) 12/28/2020   LDLCALC 87 12/28/2020   TRIG 106 12/28/2020   CHOLHDL 3.8 12/28/2020   Lab Results  Component Value Date   NA 138 12/28/2020   K 4.2 12/28/2020   CREATININE 0.77 12/28/2020   GFRNONAA 83 12/28/2020   GFRAA 96 12/28/2020   GLUCOSE 101 (H) 12/28/2020      ---------------------------------------------------------------------------------------------------   Patient Active Problem List   Diagnosis Date Noted   Polyp of transverse colon    Hyperlipidemia associated with type 2 diabetes mellitus ([place]) 07/12/2019   Benign neoplasm of descending colon    Personal history of colonic polyps    Diabetes mellitus type 2, uncomplicated ([place]) 0000000   Elevated liver enzymes 11/13/2015   Hypokalemia 11/13/2015   Avitaminosis D 10/10/2015   Insomnia 10/10/2015   Knee pain 10/10/2015   Hypercholesterolemia 03/08/2010   Morbid obesity ([place]) 02/02/2010   Cannot sleep 12/25/2005   Absolute anemia 12/03/2005   Hypertension associated with diabetes ([place]) 02/04/2000   Past Medical History:  Diagnosis Date   Diabetes mellitus without complication ([place])    Hyperlipemia    Hypertension    Neuromuscular disorder ([place])    Obesity    Allergies  Allergen Reactions   Mucinex [Guaifenesin Er] Other (See Comments)    syncope       Medications: Outpatient Medications Prior to Visit  Medication Sig   albuterol (VENTOLIN HFA) 108 (90 Base) MCG/ACT inhaler Inhale 2 puffs into the lungs every 6 (six) hours as needed for wheezing or shortness of breath.   amLODipine (NORVASC) 10 MG tablet Take 1 tablet (10 mg total) by mouth daily.   atorvastatin (LIPITOR) 20 MG tablet Take 1 tablet (20 mg total) by mouth daily.   diclofenac Sodium (VOLTAREN) 1 % GEL Apply 4 g topically 4 (four) times daily.   Dulaglutide (TRULICITY) 1.5 0000000 SOPN Inject 1.5 mg into the skin once a week.   gabapentin (NEURONTIN) 600 MG tablet Take 2 tablets (1,200 mg total) by mouth at bedtime.   gentamicin cream (GARAMYCIN) 0.1 % Apply 1 application topically 2 (two) times daily.   glucose blood test strip Use as instructed   levocetirizine (XYZAL) 5 MG tablet Take 1 tablet (5 mg total) by mouth every evening.   lisinopril-hydrochlorothiazide (ZESTORETIC) 20-25 MG tablet Take 1  tablet by mouth daily.   metFORMIN (GLUCOPHAGE) 1000 MG tablet Take 1 tablet (1,000 mg total) by mouth 2 (two) times daily with a meal.   Multiple Vitamin (MULTIVITAMIN) capsule Take 1 capsule by mouth daily.   traMADol (ULTRAM) 50 MG tablet TAKE 1 TABLET BY MOUTH EVERY 8 HOURS AS NEEDED FOR SEVERE PAIN   traZODone (DESYREL) 100 MG tablet TAKE 1 TABLET BY MOUTH AT BEDTIME   No facility-administered medications prior to visit.    Review of Systems  Constitutional:  Negative for chills, fatigue and fever.  HENT:  Negative for ear pain, sinus pressure, sinus pain and sore throat.   Eyes:  Negative for pain and visual disturbance.  Respiratory:  Negative for cough, chest tightness, shortness of breath and wheezing.   Cardiovascular:  Negative for chest pain, palpitations and leg swelling.  Gastrointestinal:  Negative for abdominal pain, blood in stool, diarrhea, nausea and vomiting.  Genitourinary:  Negative for flank pain, frequency, pelvic pain and urgency.  Musculoskeletal:  Negative for back pain, myalgias and neck pain.  Neurological:  Negative for dizziness, weakness, light-headedness, numbness and headaches.   Last CBC Lab Results  Component Value Date  WBC 5.4 12/28/2020   HGB 13.3 12/28/2020   HCT 40.4 12/28/2020   MCV 83 12/28/2020   MCH 27.3 12/28/2020   RDW 12.0 12/28/2020   PLT 281 [id number]   Last metabolic panel Lab Results  Component Value Date   GLUCOSE 101 (H) 12/28/2020   NA 138 12/28/2020   K 4.2 12/28/2020   CL 99 12/28/2020   CO2 24 12/28/2020   BUN 10 12/28/2020   CREATININE 0.77 12/28/2020   GFRNONAA 83 12/28/2020   GFRAA 96 12/28/2020   CALCIUM 9.8 12/28/2020   PROT 8.3 12/28/2020   ALBUMIN 4.2 12/28/2020   LABGLOB 4.1 12/28/2020   AGRATIO 1.0 (L) 12/28/2020   BILITOT 0.3 12/28/2020   ALKPHOS 85 12/28/2020   AST 22 12/28/2020   ALT 21 12/28/2020   ANIONGAP 9 09/23/2012   Last lipids Lab Results  Component Value Date   CHOL 145 12/28/2020    HDL 38 (L) 12/28/2020   LDLCALC 87 12/28/2020   TRIG 106 12/28/2020   CHOLHDL 3.8 12/28/2020   Last hemoglobin A1c Lab Results  Component Value Date   HGBA1C 6.7 (A) 01/02/2021   Last thyroid functions Lab Results  Component Value Date   TSH 0.748 12/28/2020       Objective  -------------------------------------------------------------------------------------------------------------------- Today's Vitals   07/10/21 0823 07/10/21 0904  BP: (!) 152/66 125/82  Pulse: 70   Temp: 98.1 F (36.7 C)   TempSrc: Oral   SpO2: 99%   Weight: 240 lb 11.2 oz (109.2 kg)    Body mass index is 41.32 kg/m.  BP Readings from Last 3 Encounters:  02/27/21 (!) 171/99  12/28/20 123/67  03/16/20 138/76    Wt Readings from Last 3 Encounters:  02/27/21 232 lb (105.2 kg)  12/28/20 231 lb 9.6 oz (105.1 kg)  03/16/20 248 lb (112.5 kg)     Physical Exam Vitals reviewed.  Constitutional:      General: She is not in acute distress.    Appearance: Normal appearance. She is well-developed. She is not diaphoretic.  HENT:     Head: Normocephalic and atraumatic.  Eyes:     General: No scleral icterus.    Conjunctiva/sclera: Conjunctivae normal.  Neck:     Thyroid: No thyromegaly.  Cardiovascular:     Rate and Rhythm: Normal rate and regular rhythm.     Pulses: Normal pulses.     Heart sounds: Normal heart sounds. No murmur heard. Pulmonary:     Effort: Pulmonary effort is normal. No respiratory distress.     Breath sounds: Normal breath sounds. No wheezing, rhonchi or rales.  Musculoskeletal:     Cervical back: Neck supple.     Right lower leg: No edema.     Left lower leg: No edema.  Feet:     Right foot:     Skin integrity: Dry skin present.     Left foot:     Skin integrity: Dry skin present.     Comments: DIABETIC FOOT EXAM: MONOFILAMENT TEST NORMAL IN BILATERAL FEET. PULSES INTACT. NO SORES. NORMAL CAPILLARY REFILL.            Lymphadenopathy:     Cervical: No cervical  adenopathy.  Skin:    General: Skin is warm and dry.     Findings: No rash.  Neurological:     Mental Status: She is alert and oriented to person, place, and time. Mental status is at baseline.  Psychiatric:        Mood and Affect:  Mood normal.        Behavior: Behavior normal.     No results found for any visits on 07/10/21.  Assessment & Plan  ---------------------------------------------------------------------------------------------------------------------- Problem List Items Addressed This Visit       Cardiovascular and Mediastinum   Hypertension associated with diabetes ([place])    Well controlled on home readings Continue current medications Recheck metabolic panel F/u in 6 months       Relevant Medications   Dulaglutide (TRULICITY) 1.5 0000000 SOPN     Endocrine   T2DM (type 2 diabetes mellitus) ([place]) - Primary    Well controlled  Continue current medications UTD on vaccines, upcoming eye exam, foot exam today On ACEi On Statin Discussed diet and exercise F/u in 6 months       Relevant Medications   Dulaglutide (TRULICITY) 1.5 0000000 SOPN   Other Relevant Orders   POCT HgB A1C   CBC with Differential/Platelet   Comprehensive metabolic panel   Lipid panel   Hyperlipidemia associated with type 2 diabetes mellitus (HCC)    Previously well controlled Continue statin Repeat FLP and CMP Goal LDL < 70      Relevant Medications   Dulaglutide (TRULICITY) 1.5 0000000 SOPN     Other   Morbid obesity (H[facility])    Discussed importance of healthy weight management Discussed diet and exercise       Relevant Medications   Dulaglutide (TRULICITY) 1.5 0000000 SOPN   Avitaminosis D   Other Visit Diagnoses     BMI 40.0-44.9, adult (HCC)       Relevant Medications   Dulaglutide (TRULICITY) 1.5 0000000 SOPN        Return in about 6 months (around 01/10/2022) for CPE, With new PCP.     I,Essence Turner,acting as a Education administrator for Lavon Paganini, MD.,have  documented all relevant documentation on the behalf of Lavon Paganini, MD,as directed by  Lavon Paganini, MD while in the presence of Lavon Paganini, MD.   I, Lavon Paganini, MD, have reviewed all documentation for this visit. The documentation on 07/10/21 for the exam, diagnosis, procedures, and orders are all accurate and complete.   Kylen Schliep, Dionne Bucy, MD, MPH [facility]

## 2021-07-11 LAB — COMPREHENSIVE METABOLIC PANEL
ALT: 13 IU/L (ref 0–32)
AST: 14 IU/L (ref 0–40)
Albumin/Globulin Ratio: 1.1 — ABNORMAL LOW (ref 1.2–2.2)
Albumin: 4.2 g/dL (ref 3.8–4.8)
Alkaline Phosphatase: 69 IU/L (ref 44–121)
BUN/Creatinine Ratio: 16 (ref 12–28)
BUN: 13 mg/dL (ref 8–27)
Bilirubin Total: 0.3 mg/dL (ref 0.0–1.2)
CO2: 26 mmol/L (ref 20–29)
Calcium: 9.4 mg/dL (ref 8.7–10.3)
Chloride: 99 mmol/L (ref 96–106)
Creatinine, Ser: 0.8 mg/dL (ref 0.57–1.00)
Globulin, Total: 3.7 g/dL (ref 1.5–4.5)
Glucose: 119 mg/dL — ABNORMAL HIGH (ref 65–99)
Potassium: 3.8 mmol/L (ref 3.5–5.2)
Sodium: 139 mmol/L (ref 134–144)
Total Protein: 7.9 g/dL (ref 6.0–8.5)
eGFR: 83 mL/min/{1.73_m2} (ref >59)

## 2021-07-11 LAB — CBC WITH DIFFERENTIAL/PLATELET
Basophils Absolute: 0 10*3/uL (ref 0.0–0.2)
Basos: 1 %
EOS (ABSOLUTE): 0.1 10*3/uL (ref 0.0–0.4)
Eos: 3 %
Hematocrit: 38.9 % (ref 34.0–46.6)
Hemoglobin: 12.3 g/dL (ref 11.1–15.9)
Immature Grans (Abs): 0 10*3/uL (ref 0.0–0.1)
Immature Granulocytes: 0 %
Lymphocytes Absolute: 2.3 10*3/uL (ref 0.7–3.1)
Lymphs: 44 %
MCH: 27 pg (ref 26.6–33.0)
MCHC: 31.6 g/dL (ref 31.5–35.7)
MCV: 86 fL (ref 79–97)
Monocytes Absolute: 0.4 10*3/uL (ref 0.1–0.9)
Monocytes: 9 %
Neutrophils Absolute: 2.2 10*3/uL (ref 1.4–7.0)
Neutrophils: 43 %
Platelets: 270 10*3/uL (ref 150–450)
RBC: 4.55 x10E6/uL (ref 3.77–5.28)
RDW: 13.1 % (ref 11.7–15.4)
WBC: 5.1 10*3/uL (ref 3.4–10.8)

## 2021-07-11 LAB — LIPID PANEL
Chol/HDL Ratio: 4.1 ratio (ref 0.0–4.4)
Cholesterol, Total: 217 mg/dL — ABNORMAL HIGH (ref 100–199)
HDL: 53 mg/dL (ref >39)
LDL Chol Calc (NIH): 142 mg/dL — ABNORMAL HIGH (ref 0–99)
Triglycerides: 124 mg/dL (ref 0–149)
VLDL Cholesterol Cal: 22 mg/dL (ref 5–40)

## 2021-07-27 ENCOUNTER — Other Ambulatory Visit: Payer: Self-pay | Admitting: Family Medicine

## 2021-07-27 DIAGNOSIS — E0843 Diabetes mellitus due to underlying condition with diabetic autonomic (poly)neuropathy: Secondary | ICD-10-CM

## 2021-10-15 DIAGNOSIS — E119 Type 2 diabetes mellitus without complications: Secondary | ICD-10-CM | POA: Diagnosis not present

## 2021-10-15 LAB — HM DIABETES EYE EXAM

## 2021-11-05 ENCOUNTER — Other Ambulatory Visit: Payer: Self-pay

## 2021-11-05 ENCOUNTER — Telehealth: Payer: Self-pay | Admitting: Family Medicine

## 2021-11-05 DIAGNOSIS — E0843 Diabetes mellitus due to underlying condition with diabetic autonomic (poly)neuropathy: Secondary | ICD-10-CM

## 2021-11-05 MED ORDER — GABAPENTIN 600 MG PO TABS
1200.0000 mg | ORAL_TABLET | Freq: Every day | ORAL | 0 refills | Status: DC
Start: 2021-11-05 — End: 2022-01-31

## 2021-11-05 NOTE — Telephone Encounter (Signed)
Nettie faxed refill request for the following medications:  gabapentin (NEURONTIN) 600 MG tablet   Please advise.

## 2021-11-05 NOTE — Telephone Encounter (Signed)
LOV 07/10/2021 NOV 01/10/22 LRF 07/27/21 180 x 0

## 2021-11-09 ENCOUNTER — Encounter: Payer: Self-pay | Admitting: Family Medicine

## 2021-12-05 ENCOUNTER — Other Ambulatory Visit: Payer: Self-pay

## 2021-12-05 ENCOUNTER — Encounter: Payer: Self-pay | Admitting: Family Medicine

## 2021-12-05 ENCOUNTER — Ambulatory Visit: Payer: BC Managed Care – PPO | Admitting: Family Medicine

## 2021-12-05 VITALS — BP 160/90 | HR 67 | Temp 98.8°F | Resp 16 | Ht 64.0 in | Wt 250.3 lb

## 2021-12-05 DIAGNOSIS — E119 Type 2 diabetes mellitus without complications: Secondary | ICD-10-CM

## 2021-12-05 DIAGNOSIS — G8929 Other chronic pain: Secondary | ICD-10-CM

## 2021-12-05 DIAGNOSIS — M25562 Pain in left knee: Secondary | ICD-10-CM

## 2021-12-05 DIAGNOSIS — M25531 Pain in right wrist: Secondary | ICD-10-CM

## 2021-12-05 DIAGNOSIS — E785 Hyperlipidemia, unspecified: Secondary | ICD-10-CM

## 2021-12-05 DIAGNOSIS — I152 Hypertension secondary to endocrine disorders: Secondary | ICD-10-CM

## 2021-12-05 DIAGNOSIS — G4700 Insomnia, unspecified: Secondary | ICD-10-CM

## 2021-12-05 DIAGNOSIS — E1169 Type 2 diabetes mellitus with other specified complication: Secondary | ICD-10-CM | POA: Diagnosis not present

## 2021-12-05 DIAGNOSIS — E1159 Type 2 diabetes mellitus with other circulatory complications: Secondary | ICD-10-CM

## 2021-12-05 DIAGNOSIS — E7800 Pure hypercholesterolemia, unspecified: Secondary | ICD-10-CM

## 2021-12-05 LAB — POCT GLYCOSYLATED HEMOGLOBIN (HGB A1C)
Est. average glucose Bld gHb Est-mCnc: 131
Hemoglobin A1C: 6.2 % — AB (ref 4.0–5.6)

## 2021-12-05 MED ORDER — METFORMIN HCL 1000 MG PO TABS: 1000.0000 mg | ORAL_TABLET | Freq: Two times a day (BID) | ORAL | 3 refills | Status: DC

## 2021-12-05 MED ORDER — TRAZODONE HCL 100 MG PO TABS: 100.0000 mg | ORAL_TABLET | Freq: Every day | ORAL | 3 refills | Status: DC

## 2021-12-05 MED ORDER — AMLODIPINE BESYLATE 10 MG PO TABS: 10.0000 mg | ORAL_TABLET | Freq: Every day | ORAL | 3 refills | Status: DC

## 2021-12-05 MED ORDER — HYDROCHLOROTHIAZIDE 50 MG PO TABS: 50.0000 mg | ORAL_TABLET | Freq: Every day | ORAL | 3 refills | Status: DC

## 2021-12-05 MED ORDER — TRULICITY 1.5 MG/0.5ML ~~LOC~~ SOAJ: 1.5000 mg | SUBCUTANEOUS | 3 refills | Status: DC

## 2021-12-05 MED ORDER — LISINOPRIL-HYDROCHLOROTHIAZIDE 20-25 MG PO TABS: 1.0000 | ORAL_TABLET | Freq: Every day | ORAL | 3 refills | Status: DC

## 2021-12-05 MED ORDER — LISINOPRIL 20 MG PO TABS: 40.0000 mg | ORAL_TABLET | Freq: Every day | ORAL | 3 refills | Status: DC

## 2021-12-05 MED ORDER — DICLOFENAC SODIUM 1 % EX GEL: 4.0000 g | Freq: Four times a day (QID) | CUTANEOUS | 0 refills | Status: DC

## 2021-12-05 NOTE — Assessment & Plan Note (Signed)
Slight increase in H4R- continue trulicity

## 2021-12-05 NOTE — Assessment & Plan Note (Signed)
Goal of LDL <70; change statin to meet goal Has not been following a low fat diet recommend diet low in saturated fat and regular exercise - 30 min at least 5 times per week Unable to exercise d/t knee pain

## 2021-12-05 NOTE — Assessment & Plan Note (Signed)
Chronic, stable Denies snoring or gasping for breath

## 2021-12-05 NOTE — Progress Notes (Signed)
Established patient visit   Patient: Julia Robertson   DOB: [birth year]   66 y.o. Female  MRN: [id number] Visit Date: 12/05/2021  Today's healthcare provider: Gwyneth Sprout, FNP    I,Joseline E Rosas,acting as a scribe for Gwyneth Sprout, FNP.,have documented all relevant documentation on the behalf of Gwyneth Sprout, FNP,as directed by  Gwyneth Sprout, FNP while in the presence of Gwyneth Sprout, FNP.   Chief Complaint  Patient presents with   follow up Chronic disease   Subjective    HPI Patient here for medication refills. Insomnia: stable on Trazodone.  Knee Pain. She currently on Tramadol. Per patient doing good. Now is her her left knee bothering her and needs refill.   Hypertension: Patient for follow up on hypertension and reports that her BP readings are stable. Hone readings: 130's/70's. Symptoms: No chest pain No chest pressure  No palpitations No syncope  No dyspnea No orthopnea  No paroxysmal nocturnal dyspnea No lower extremity edema   T2DM:  Lab Results  Component Value Date   HGBA1C 6.2 (A) 12/05/2021   HGBA1C 6.0 (A) 07/10/2021   HGBA1C 6.7 (A) 01/02/2021   Last seen for diabetes 5 months ago.  Management since then includes continue current medications. She reports excellent compliance with treatment. She is not having side effects.  Symptoms: No fatigue No foot ulcerations  No appetite changes No nausea  Yes paresthesia of the feet  No polydipsia  No polyuria No visual disturbances   No vomiting     Home blood sugar records:  once a week fasting :170.  Episodes of hypoglycemia? No   Most Recent Eye Exam: Last month Current exercise: none Current diet habits: in general, an "unhealthy" diet, Regular  Pertinent Labs: Lab Results  Component Value Date   CHOL 217 (H) 07/10/2021   HDL 53 07/10/2021   LDLCALC 142 (H) 07/10/2021   TRIG 124 07/10/2021   CHOLHDL 4.1 07/10/2021   Lab Results  Component Value Date   NA 139 07/10/2021    K 3.8 07/10/2021   CREATININE 0.80 07/10/2021   EGFR 83 07/10/2021   MICROALBUR 50 12/05/2017     ---------------------------------------------------------------------------------------------------   Medications: Outpatient Medications Prior to Visit  Medication Sig   atorvastatin (LIPITOR) 20 MG tablet Take 1 tablet (20 mg total) by mouth daily.   gabapentin (NEURONTIN) 600 MG tablet Take 2 tablets (1,200 mg total) by mouth at bedtime.   Multiple Vitamin (MULTIVITAMIN) capsule Take 1 capsule by mouth daily.   [DISCONTINUED] albuterol (VENTOLIN HFA) 108 (90 Base) MCG/ACT inhaler Inhale 2 puffs into the lungs every 6 (six) hours as needed for wheezing or shortness of breath.   [DISCONTINUED] amLODipine (NORVASC) 10 MG tablet Take 1 tablet (10 mg total) by mouth daily.   [DISCONTINUED] diclofenac Sodium (VOLTAREN) 1 % GEL Apply 4 g topically 4 (four) times daily.   [DISCONTINUED] Dulaglutide (TRULICITY) 1.5 KZ/6.0FU SOPN Inject 1.5 mg into the skin once a week.   [DISCONTINUED] gentamicin cream (GARAMYCIN) 0.1 % Apply 1 application topically 2 (two) times daily.   [DISCONTINUED] glucose blood test strip Use as instructed   [DISCONTINUED] lisinopril-hydrochlorothiazide (ZESTORETIC) 20-25 MG tablet Take 1 tablet by mouth daily.   [DISCONTINUED] metFORMIN (GLUCOPHAGE) 1000 MG tablet Take 1 tablet (1,000 mg total) by mouth 2 (two) times daily with a meal.   [DISCONTINUED] traMADol (ULTRAM) 50 MG tablet TAKE 1 TABLET BY MOUTH EVERY 8 HOURS AS NEEDED FOR SEVERE PAIN   [  DISCONTINUED] traZODone (DESYREL) 100 MG tablet TAKE 1 TABLET BY MOUTH AT BEDTIME   [DISCONTINUED] levocetirizine (XYZAL) 5 MG tablet Take 1 tablet (5 mg total) by mouth every evening.   No facility-administered medications prior to visit.    Review of Systems     Objective    BP (!) 160/90 (BP Location: Right Arm, Patient Position: Sitting, Cuff Size: Large) Comment: manual   Pulse 67    Temp 98.8 F (37.1 C) (Oral)     Resp 16    Ht _0  (1.626 m)    Wt 250 lb 4.8 oz (113.5 kg)    BMI 42.96 kg/m    Physical Exam Vitals and nursing note reviewed.  Constitutional:      General: She is not in acute distress.    Appearance: Normal appearance. She is obese. She is not ill-appearing, toxic-appearing or diaphoretic.  HENT:     Head: Normocephalic and atraumatic.  Cardiovascular:     Rate and Rhythm: Normal rate and regular rhythm.     Pulses: Normal pulses.     Heart sounds: Normal heart sounds. No murmur heard.   No friction rub. No gallop.  Pulmonary:     Effort: Pulmonary effort is normal. No respiratory distress.     Breath sounds: Normal breath sounds. No stridor. No wheezing, rhonchi or rales.  Chest:     Chest wall: No tenderness.  Abdominal:     General: Bowel sounds are normal.     Palpations: Abdomen is soft.  Musculoskeletal:        General: Swelling and tenderness present. No deformity or signs of injury. Normal range of motion.     Right lower leg: Edema present.     Left lower leg: Edema present.  Skin:    General: Skin is warm and dry.     Capillary Refill: Capillary refill takes less than 2 seconds.     Coloration: Skin is not jaundiced or pale.     Findings: No bruising, erythema, lesion or rash.  Neurological:     General: No focal deficit present.     Mental Status: She is alert and oriented to person, place, and time. Mental status is at baseline.     Cranial Nerves: No cranial nerve deficit.     Sensory: No sensory deficit.     Motor: No weakness.     Coordination: Coordination normal.  Psychiatric:        Mood and Affect: Mood normal.        Behavior: Behavior normal.        Thought Content: Thought content normal.        Judgment: Judgment normal.     Results for orders placed or performed in visit on 12/05/21  POCT glycosylated hemoglobin (Hb A1C)  Result Value Ref Range   Hemoglobin A1C 6.2 (A) 4.0 - 5.6 %   Est. average glucose Bld gHb Est-mCnc 131      Assessment & Plan     Problem List Items Addressed This Visit       Cardiovascular and Mediastinum   Hypertension associated with diabetes ([place])    Elevated; not checking at home Checked in office with both manual and automatic cuff Change medications today- recommend RTC 1 month Recommend start home log- bring machine with when return      Relevant Medications   amLODipine (NORVASC) 10 MG tablet   Dulaglutide (TRULICITY) 1.5 YY/4.8GN SOPN   metFORMIN (GLUCOPHAGE) 1000 MG tablet   lisinopril (  ZESTRIL) 20 MG tablet   hydrochlorothiazide (HYDRODIURIL) 50 MG tablet     Endocrine   T2DM (type 2 diabetes mellitus) (HCC) - Primary    Slight increase in Y8L- continue trulicity       Relevant Medications   amLODipine (NORVASC) 10 MG tablet   Dulaglutide (TRULICITY) 1.5 YH/9.0BP SOPN   metFORMIN (GLUCOPHAGE) 1000 MG tablet   lisinopril (ZESTRIL) 20 MG tablet   Other Relevant Orders   POCT glycosylated hemoglobin (Hb A1C) (Completed)   Hyperlipidemia associated with type 2 diabetes mellitus (HCC)    Goal of LDL <70; change statin to meet goal Has not been following a low fat diet recommend diet low in saturated fat and regular exercise - 30 min at least 5 times per week Unable to exercise d/t knee pain      Relevant Medications   amLODipine (NORVASC) 10 MG tablet   Dulaglutide (TRULICITY) 1.5 JP/2.1KK SOPN   metFORMIN (GLUCOPHAGE) 1000 MG tablet   lisinopril (ZESTRIL) 20 MG tablet   hydrochlorothiazide (HYDRODIURIL) 50 MG tablet     Other   Morbid obesity (HCC)    BMI 43 HTN, HLD, DM Discussed importance of healthy weight management Discussed diet and exercise       Relevant Medications   Dulaglutide (TRULICITY) 1.5 OE/6.9FQ SOPN   metFORMIN (GLUCOPHAGE) 1000 MG tablet   Insomnia    Chronic, stable Denies snoring or gasping for breath      Relevant Medications   traZODone (DESYREL) 100 MG tablet   Knee pain    L knee pain; does not want injections Hx of R  knee pain now s/p revision       Relevant Medications   diclofenac Sodium (VOLTAREN) 1 % GEL   RESOLVED: Hypercholesterolemia   Relevant Medications   amLODipine (NORVASC) 10 MG tablet   lisinopril (ZESTRIL) 20 MG tablet   hydrochlorothiazide (HYDRODIURIL) 50 MG tablet   RESOLVED: Right wrist pain     Return in about 3 months (around 03/05/2022).      Vonna Kotyk, FNP, have reviewed all documentation for this visit. The documentation on 12/05/21 for the exam, diagnosis, procedures, and orders are all accurate and complete.    Gwyneth Sprout, [place] [phone] (phone) [phone] (fax)  [place]

## 2021-12-05 NOTE — Assessment & Plan Note (Signed)
Elevated; not checking at home Checked in office with both manual and automatic cuff Change medications today- recommend RTC 1 month Recommend start home log- bring machine with when return

## 2021-12-05 NOTE — Assessment & Plan Note (Signed)
BMI 43 HTN, HLD, DM Discussed importance of healthy weight management Discussed diet and exercise

## 2021-12-05 NOTE — Assessment & Plan Note (Signed)
L knee pain; does not want injections Hx of R knee pain now s/p revision

## 2022-01-10 ENCOUNTER — Encounter: Payer: BC Managed Care – PPO | Admitting: Family Medicine

## 2022-01-24 ENCOUNTER — Ambulatory Visit (INDEPENDENT_AMBULATORY_CARE_PROVIDER_SITE_OTHER): Payer: BC Managed Care – PPO | Admitting: Family Medicine

## 2022-01-24 ENCOUNTER — Encounter: Payer: Self-pay | Admitting: Family Medicine

## 2022-01-24 ENCOUNTER — Other Ambulatory Visit: Payer: Self-pay

## 2022-01-24 VITALS — BP 112/80 | HR 66 | Temp 98.0°F | Resp 16 | Ht 64.0 in | Wt 244.2 lb

## 2022-01-24 DIAGNOSIS — Z1231 Encounter for screening mammogram for malignant neoplasm of breast: Secondary | ICD-10-CM | POA: Insufficient documentation

## 2022-01-24 DIAGNOSIS — E1169 Type 2 diabetes mellitus with other specified complication: Secondary | ICD-10-CM

## 2022-01-24 DIAGNOSIS — Z0000 Encounter for general adult medical examination without abnormal findings: Secondary | ICD-10-CM | POA: Insufficient documentation

## 2022-01-24 DIAGNOSIS — E785 Hyperlipidemia, unspecified: Secondary | ICD-10-CM

## 2022-01-24 DIAGNOSIS — Z862 Personal history of diseases of the blood and blood-forming organs and certain disorders involving the immune mechanism: Secondary | ICD-10-CM | POA: Diagnosis not present

## 2022-01-24 DIAGNOSIS — R0989 Other specified symptoms and signs involving the circulatory and respiratory systems: Secondary | ICD-10-CM | POA: Insufficient documentation

## 2022-01-24 DIAGNOSIS — I152 Hypertension secondary to endocrine disorders: Secondary | ICD-10-CM | POA: Diagnosis not present

## 2022-01-24 DIAGNOSIS — Z23 Encounter for immunization: Secondary | ICD-10-CM | POA: Insufficient documentation

## 2022-01-24 DIAGNOSIS — E1159 Type 2 diabetes mellitus with other circulatory complications: Secondary | ICD-10-CM | POA: Diagnosis not present

## 2022-01-24 DIAGNOSIS — E559 Vitamin D deficiency, unspecified: Secondary | ICD-10-CM | POA: Diagnosis not present

## 2022-01-24 NOTE — Assessment & Plan Note (Signed)
Repeat CBC today ?Denies complaints of fatigue, tiredness or excess bruising  ?

## 2022-01-24 NOTE — Assessment & Plan Note (Signed)
HLD ?- medications: lipitor, 20 mg qday ?- compliance: good ?- medication SEs: denies ?Reinforce LDL of <70 ?Repeat FLP today; discussed possibility of needing to adjust increase of statin based on lab results ?

## 2022-01-24 NOTE — Progress Notes (Signed)
I,Sulibeya S Dimas,acting as a Education administrator for Gwyneth Sprout, FNP.,have documented all relevant documentation on the behalf of Gwyneth Sprout, FNP,as directed by  Gwyneth Sprout, FNP while in the presence of Gwyneth Sprout, FNP.   Complete physical exam   Patient: Julia Robertson   DOB: [birth year]   66 y.o. Female  MRN: [id number] Visit Date: 01/24/2022  Today's healthcare provider: Gwyneth Sprout, FNP  Reminded of nurse practitioner role and practice setting.  All questions answered.  Discussed provider/patient relationship and expectations.   Chief Complaint  Patient presents with   Annual Exam   Subjective    Julia Robertson is a 66 y.o. female who presents today for a complete physical exam.  She reports consuming a general diet. The patient does not participate in regular exercise at present. She generally feels well. She reports sleeping well. She does have additional problems to discuss today.  HPI    Past Medical History:  Diagnosis Date   Diabetes mellitus without complication ([place])    Hyperlipemia    Hypertension    Neuromuscular disorder ([place])    Obesity    Past Surgical History:  Procedure Laterality Date   CHOLECYSTECTOMY     COLONOSCOPY WITH PROPOFOL N/A 11/28/2015   Procedure: COLONOSCOPY WITH PROPOFOL;  Surgeon: Lucilla Lame, MD;  Location: [facility] ENDOSCOPY;  Service: Endoscopy;  Laterality: N/A;   COLONOSCOPY WITH PROPOFOL N/A 02/27/2021   Procedure: COLONOSCOPY WITH PROPOFOL;  Surgeon: Lucilla Lame, MD;  Location: [facility] ENDOSCOPY;  Service: Endoscopy;  Laterality: N/A;   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, [place]  Tobacco Use   Smoking  status: Never   Smokeless tobacco: Never  Vaping Use   Vaping Use: Never used  Substance and Sexual Activity   Alcohol use: No   Drug use: No   Sexual activity: Not on file  Other Topics Concern   Not on file  Social History Narrative   Not on file   Social Determinants of Health   Financial Resource Strain: Not on file  Food Insecurity: Not on file  Transportation Needs: Not on file  Physical Activity: Not on file  Stress: Not on file  Social Connections: Not on file  Intimate Partner Violence: Not on file   Family Status  Relation Name Status   Mother  Deceased   Father  Deceased   Sister  Alive   Brother  Deceased   Brother  Deceased   Brother  Alive   Brother  Alive   Brother  Alive   Neg Hx  (Not Specified)   Family History  Problem Relation Age of Onset   Cancer Mother        pancreatic   Cancer Father        prostate   Hypertension Sister    Hypertension Brother    Kidney disease Brother    Hypertension Brother    Kidney disease Brother    Hypertension Brother    Diabetes Brother    Hypertension Brother    Breast cancer Neg Hx    Allergies  Allergen Reactions   Mucinex [Guaifenesin Er] Other (See Comments)    syncope    Patient Care Team: Gwyneth Sprout, FNP as PCP - General (Family Medicine) Cathi Roan, [facility] (Inactive) (Pharmacist)   Medications: Outpatient Medications Prior to Visit  Medication Sig   amLODipine (NORVASC) 10 MG tablet Take 1 tablet (10 mg total) by mouth daily.   atorvastatin (LIPITOR) 20 MG tablet Take 1 tablet (20 mg total) by mouth daily.   diclofenac Sodium (VOLTAREN) 1 % GEL Apply 4 g topically 4 (four) times daily.   Dulaglutide (TRULICITY) 1.5 YW/7.3XT SOPN Inject 1.5 mg into the skin once a week.   gabapentin (NEURONTIN) 600 MG tablet Take 2 tablets (1,200 mg total) by mouth at bedtime.   hydrochlorothiazide (HYDRODIURIL) 50 MG tablet Take 1 tablet (50 mg total) by mouth daily.   lisinopril (ZESTRIL) 20 MG  tablet Take 2 tablets (40 mg total) by mouth daily.   metFORMIN (GLUCOPHAGE) 1000 MG tablet Take 1 tablet (1,000 mg total) by mouth 2 (two) times daily with a meal.   Multiple Vitamin (MULTIVITAMIN) capsule Take 1 capsule by mouth daily.   traZODone (DESYREL) 100 MG tablet Take 1 tablet (100 mg total) by mouth at bedtime.   No facility-administered medications prior to visit.    Review of Systems  HENT:  Positive for dental problem.   Musculoskeletal:  Positive for arthralgias and myalgias.  All other systems reviewed and are negative.  Last CBC Lab Results  Component Value Date   WBC 5.1 07/10/2021   HGB 12.3 07/10/2021   HCT 38.9 07/10/2021   MCV 86 07/10/2021   MCH 27.0 07/10/2021   RDW 13.1 07/10/2021   PLT 270 05/20/9484   Last metabolic panel Lab Results  Component Value Date   GLUCOSE 119 (H) 07/10/2021   NA 139 07/10/2021   K 3.8 07/10/2021   CL 99 07/10/2021   CO2 26 07/10/2021   BUN 13 07/10/2021   CREATININE 0.80 07/10/2021   EGFR 83 07/10/2021   CALCIUM 9.4 07/10/2021   PROT 7.9 07/10/2021   ALBUMIN 4.2 07/10/2021   LABGLOB 3.7 07/10/2021   AGRATIO 1.1 (L) 07/10/2021   BILITOT 0.3 07/10/2021   ALKPHOS 69 07/10/2021   AST 14 07/10/2021   ALT 13 07/10/2021   ANIONGAP 9 09/23/2012   Last lipids Lab Results  Component Value Date   CHOL 217 (H) 07/10/2021   HDL 53 07/10/2021   LDLCALC 142 (H) 07/10/2021   TRIG 124 07/10/2021   CHOLHDL 4.1 07/10/2021   Last hemoglobin A1c Lab Results  Component Value Date   HGBA1C 6.2 (A) 12/05/2021   Last thyroid functions Lab Results  Component Value Date   TSH 0.748 12/28/2020       Objective    BP 112/80 (BP Location: Left Arm, Patient Position: Sitting, Cuff Size: Large)    Pulse 66    Temp 98 F (36.7 C) (Temporal)    Resp 16    Ht _0  (1.626 m)    Wt 244 lb 3.2 oz (110.8 kg)    SpO2 100%    BMI 41.92 kg/m  BP Readings from Last 3 Encounters:  01/24/22 112/80  12/05/21 (!) 160/90  07/10/21  125/82   Wt Readings from Last 3 Encounters:  01/24/22 244 lb 3.2 oz (110.8 kg)  12/05/21 250 lb 4.8 oz (113.5 kg)  07/10/21 240 lb 11.2 oz (109.2 kg)       Physical Exam Vitals and nursing note reviewed.  Constitutional:      General: She is awake. She is not in acute distress.    Appearance: Normal appearance. She is well-developed and well-groomed. She is obese. She is not ill-appearing, toxic-appearing or diaphoretic.  HENT:     Head: Normocephalic and atraumatic.     Jaw: There is normal jaw occlusion. No trismus, tenderness, swelling or pain on movement.     Right Ear: Hearing, tympanic membrane, ear canal and external ear normal. There is no impacted cerumen.     Left Ear: Hearing, tympanic membrane, ear canal and external ear normal. There is no impacted cerumen.     Nose: Nose normal. No congestion or rhinorrhea.     Right Turbinates: Not enlarged, swollen or pale.     Left Turbinates: Not enlarged, swollen or pale.     Right Sinus: No maxillary sinus tenderness or frontal sinus tenderness.     Left Sinus: No maxillary sinus tenderness or frontal sinus tenderness.     Mouth/Throat:     Lips: Pink.     Mouth: Mucous membranes are moist. No injury.     Tongue: No lesions.     Pharynx: Oropharynx is clear. Uvula midline. No pharyngeal swelling, oropharyngeal exudate, posterior oropharyngeal erythema or uvula swelling.     Tonsils: No tonsillar exudate or tonsillar abscesses.  Eyes:     General: Lids are normal. Lids are everted, no foreign bodies appreciated. Vision grossly intact. Gaze aligned appropriately. No allergic shiner or visual field deficit.       Right eye: No discharge.        Left eye: No discharge.     Extraocular Movements: Extraocular movements intact.     Conjunctiva/sclera: Conjunctivae normal.     Right eye: Right conjunctiva is not injected. No exudate.    Left eye: Left conjunctiva is not injected. No exudate.    Pupils: Pupils are equal, round, and  reactive to light.  Neck:     Thyroid: No thyroid mass, thyromegaly or thyroid tenderness.     Vascular: No carotid bruit.     Trachea: Trachea normal.  Cardiovascular:     Rate and Rhythm: Normal rate and regular rhythm.     Pulses: Normal pulses.          Carotid pulses are 2+ on the right side and 2+ on the left side.      Radial pulses are 2+ on the right side and 2+ on the left side.       Dorsalis pedis pulses are 2+ on the right side and 2+ on the left side.       Posterior tibial pulses are 2+ on the right side and 2+ on the left side.     Heart sounds: Normal heart sounds, S1 normal and S2 normal. No murmur heard.   No friction rub. No gallop.  Pulmonary:     Effort: Pulmonary effort is normal. No respiratory distress.     Breath sounds: Normal breath sounds and air entry. No stridor. No wheezing, rhonchi or rales.  Chest:     Chest wall: No tenderness.     Comments: Breast exam deferred; discussed self exam and mammography Abdominal:     General: Abdomen is flat. Bowel sounds are normal. There is no distension.     Palpations: Abdomen is soft. There is no mass.     Tenderness: There is no abdominal tenderness. There is no right CVA tenderness, left CVA tenderness, guarding or rebound.  Hernia: No hernia is present.  Genitourinary:    Comments: Exam deferred; denies complaints Musculoskeletal:        General: No swelling, tenderness, deformity or signs of injury. Normal range of motion.     Cervical back: Full passive range of motion without pain, normal range of motion and neck supple. No edema, rigidity or tenderness. No muscular tenderness.     Right lower leg: No edema.     Left lower leg: No edema.  Lymphadenopathy:     Cervical: No cervical adenopathy.     Right cervical: No superficial, deep or posterior cervical adenopathy.    Left cervical: No superficial, deep or posterior cervical adenopathy.  Skin:    General: Skin is warm and dry.     Capillary Refill:  Capillary refill takes less than 2 seconds.     Coloration: Skin is not jaundiced or pale.     Findings: No bruising, erythema, lesion or rash.  Neurological:     General: No focal deficit present.     Mental Status: She is alert and oriented to person, place, and time. Mental status is at baseline.     GCS: GCS eye subscore is 4. GCS verbal subscore is 5. GCS motor subscore is 6.     Sensory: Sensation is intact. No sensory deficit.     Motor: Motor function is intact. No weakness.     Coordination: Coordination is intact. Coordination normal.     Gait: Gait is intact. Gait normal.  Psychiatric:        Attention and Perception: Attention and perception normal.        Mood and Affect: Mood and affect normal.        Speech: Speech normal.        Behavior: Behavior normal. Behavior is cooperative.        Thought Content: Thought content normal.        Cognition and Memory: Cognition and memory normal.        Judgment: Judgment normal.     Last depression screening scores PHQ 2/9 Scores 12/05/2021 07/10/2021 12/28/2020  PHQ - 2 Score 0 0 0  PHQ- 9 Score - 0 0   Last fall risk screening Fall Risk  01/24/2022  Falls in the past year? 0  Comment -  Number falls in past yr: 0  Injury with Fall? 0  Risk for fall due to : No Fall Risks  Follow up Falls evaluation completed   Last Audit-C alcohol use screening Alcohol Use Disorder Test (AUDIT) 01/24/2022  1. How often do you have a drink containing alcohol? 0  2. How many drinks containing alcohol do you have on a typical day when you are drinking? 0  3. How often do you have six or more drinks on one occasion? 0  AUDIT-C Score 0   A score of 3 or more in women, and 4 or more in men indicates increased risk for alcohol abuse, EXCEPT if all of the points are from question 1   No results found for any visits on 01/24/22.  Assessment & Plan    Routine Health Maintenance and Physical Exam  Exercise Activities and Dietary recommendations   Goals   None     Immunization History  Administered Date(s) Administered   DTaP 11/25/2008   Influenza Split 08/25/2009   Influenza,inj,Quad PF,6+ Mos 09/30/2018, 12/28/2020   MODERNA COVID-19 SARS-COV-2 PEDS BIVALENT BOOSTER 6Y-11Y 10/11/2020, 01/08/2022   Moderna Sars-Covid-2 Vaccination 11/30/2019, 12/28/2019  Pneumococcal Polysaccharide-23 11/26/1999   Td 11/25/1993   Zoster Recombinat (Shingrix) 01/24/2022    Health Maintenance  Topic Date Due   COVID-19 Vaccine (3 - Moderna risk series) 01/08/2022   INFLUENZA VACCINE  02/23/2022 (Originally 06/25/2021)   Zoster Vaccines- Shingrix (2 of 2) 03/21/2022   HEMOGLOBIN A1C  06/04/2022   FOOT EXAM  07/10/2022   OPHTHALMOLOGY EXAM  10/15/2022   MAMMOGRAM  02/16/2023   PAP SMEAR-Modifier  10/01/2023   TETANUS/TDAP  03/26/2025   COLONOSCOPY (Pts 45-80yrs Insurance coverage will need to be confirmed)  02/28/2028   Hepatitis C Screening  Completed   HIV Screening  Completed   HPV VACCINES  Aged Out    Discussed health benefits of physical activity, and encouraged her to engage in regular exercise appropriate for her age and condition.  Problem List Items Addressed This Visit       Cardiovascular and Mediastinum   Hypertension associated with diabetes ([place])    Chronic, stable  HTN: - Medications: Norvasc 10 mg, HCTZ 50 mg, Lisinopril 40 mg - Compliance: good - Checking BP at home: no; encouraged - Denies any SOB, CP, vision changes, LE edema, medication SEs, or symptoms of hypotension - Diet: general - Exercise: none; limited by OA and generalized aches/pains       Relevant Orders   Comprehensive metabolic panel   Ambulatory referral to Vascular Surgery     Endocrine   Hyperlipidemia associated with type 2 diabetes mellitus (HCC)    HLD - medications: lipitor, 20 mg qday - compliance: good - medication SEs: denies Reinforce LDL of <70 Repeat FLP today; discussed possibility of needing to adjust increase of statin  based on lab results      Relevant Orders   Lipid Panel With LDL/HDL Ratio   Ambulatory referral to Vascular Surgery   T2DM (type 2 diabetes mellitus) ([place])   Relevant Orders   Hemoglobin A1c   Ambulatory referral to Vascular Surgery     Other   Avitaminosis D    Hx of vit d deficiency Repeat labs today      Relevant Orders   VITAMIN D 25 Hydroxy (Vit-D Deficiency, Fractures)   Decreased pulses in feet    Coolness in BLE; pt complaints of R coolness worse, not distinguishable to PCP Referral to vascular for additional workup given HTN, HLD and DM      Relevant Orders   Ambulatory referral to Vascular Surgery   Encounter for annual health examination - Primary    Plans to pull all teeth UTD on vision Things to do to keep yourself healthy  - Exercise at least 30-45 minutes a day, 3-4 days a week.  - Eat a low-fat diet with lots of fruits and vegetables, up to 7-9 servings per day.  - Seatbelts can save your life. Wear them always.  - Smoke detectors on every level of your home, check batteries every year.  - Eye Doctor - have an eye exam every 1-2 years  - Safe sex - if you may be exposed to STDs, use a condom.  - Alcohol -  If you drink, do it moderately, less than 2 drinks per day.  - [place]. Choose someone to speak for you if you are not able.  - Depression is common in our stressful world.If you're feeling down or losing interest in things you normally enjoy, please come in for a visit.  - Violence - If anyone is threatening or hurting you, please  call immediately.        Relevant Orders   CBC with Differential/Platelet   Comprehensive metabolic panel   Lipid Panel With LDL/HDL Ratio   Hemoglobin A1c   Encounter for screening mammogram for malignant neoplasm of breast    Denies exam today Mammogram ordered       Relevant Orders   MM 3D SCREEN BREAST BILATERAL   History of anemia    Repeat CBC today Denies complaints of fatigue,  tiredness or excess bruising       Relevant Orders   CBC with Differential/Platelet   Morbid obesity ([place])    BMI 43 now 41.92, congratulated on weight loss Associated with HTN, HLD, DM Discussed importance of healthy weight management Discussed diet and exercise       Need for shingles vaccine    First dose provided today; consent obtained Discussed 1/2 dose series       Relevant Orders   Varicella-zoster vaccine IM (Completed)     Return in about 6 months (around 07/27/2022) for chonic disease management.     Vonna Kotyk, FNP, have reviewed all documentation for this visit. The documentation on 01/24/22 for the exam, diagnosis, procedures, and orders are all accurate and complete.    Gwyneth Sprout, [place] [phone] (phone) [phone] (fax)  [place]

## 2022-01-24 NOTE — Assessment & Plan Note (Signed)
Hx of vit d deficiency ?Repeat labs today ?

## 2022-01-24 NOTE — Assessment & Plan Note (Signed)
Denies exam today ?Mammogram ordered ? ?

## 2022-01-24 NOTE — Assessment & Plan Note (Signed)
Plans to pull all teeth ?UTD on vision ?Things to do to keep yourself healthy  ?- Exercise at least 30-45 minutes a day, 3-4 days a week.  ?- Eat a low-fat diet with lots of fruits and vegetables, up to 7-9 servings per day.  ?- Seatbelts can save your life. Wear them always.  ?- Smoke detectors on every level of your home, check batteries every year.  ?- Eye Doctor - have an eye exam every 1-2 years  ?- Safe sex - if you may be exposed to STDs, use a condom.  ?- Alcohol -  If you drink, do it moderately, less than 2 drinks per day.  ?- [place]. Choose someone to speak for you if you are not able.  ?- Depression is common in our stressful world.If you're feeling down or losing interest in things you normally enjoy, please come in for a visit.  ?- Violence - If anyone is threatening or hurting you, please call immediately. ? ? ?

## 2022-01-24 NOTE — Assessment & Plan Note (Signed)
Chronic, stable  ?HTN: ?- Medications: Norvasc 10 mg, HCTZ 50 mg, Lisinopril 40 mg ?- Compliance: good ?- Checking BP at home: no; encouraged ?- Denies any SOB, CP, vision changes, LE edema, medication SEs, or symptoms of hypotension ?- Diet: general ?- Exercise: none; limited by OA and generalized aches/pains ? ?

## 2022-01-24 NOTE — Assessment & Plan Note (Signed)
BMI 43 now 41.92, congratulated on weight loss ?Associated with HTN, HLD, DM ?Discussed importance of healthy weight management ?Discussed diet and exercise ? ?

## 2022-01-24 NOTE — Assessment & Plan Note (Signed)
Coolness in BLE; pt complaints of R coolness worse, not distinguishable to PCP ?Referral to vascular for additional workup given HTN, HLD and DM ?

## 2022-01-24 NOTE — Assessment & Plan Note (Signed)
First dose provided today; consent obtained ?Discussed 1/2 dose series  ?

## 2022-01-25 ENCOUNTER — Other Ambulatory Visit: Payer: Self-pay | Admitting: Family Medicine

## 2022-01-25 LAB — COMPREHENSIVE METABOLIC PANEL
ALT: 21 IU/L (ref 0–32)
AST: 18 IU/L (ref 0–40)
Albumin/Globulin Ratio: 1.2 (ref 1.2–2.2)
Albumin: 4.5 g/dL (ref 3.8–4.8)
Alkaline Phosphatase: 85 IU/L (ref 44–121)
BUN/Creatinine Ratio: 16 (ref 12–28)
BUN: 15 mg/dL (ref 8–27)
Bilirubin Total: 0.4 mg/dL (ref 0.0–1.2)
CO2: 27 mmol/L (ref 20–29)
Calcium: 9.6 mg/dL (ref 8.7–10.3)
Chloride: 99 mmol/L (ref 96–106)
Creatinine, Ser: 0.91 mg/dL (ref 0.57–1.00)
Globulin, Total: 3.7 g/dL (ref 1.5–4.5)
Glucose: 140 mg/dL — ABNORMAL HIGH (ref 70–99)
Potassium: 4.2 mmol/L (ref 3.5–5.2)
Sodium: 141 mmol/L (ref 134–144)
Total Protein: 8.2 g/dL (ref 6.0–8.5)
eGFR: 71 mL/min/{1.73_m2} (ref >59)

## 2022-01-25 LAB — CBC WITH DIFFERENTIAL/PLATELET
Basophils Absolute: 0.1 10*3/uL (ref 0.0–0.2)
Basos: 1 %
EOS (ABSOLUTE): 0.1 10*3/uL (ref 0.0–0.4)
Eos: 3 %
Hematocrit: 41.4 % (ref 34.0–46.6)
Hemoglobin: 13.5 g/dL (ref 11.1–15.9)
Immature Grans (Abs): 0 10*3/uL (ref 0.0–0.1)
Immature Granulocytes: 0 %
Lymphocytes Absolute: 2.1 10*3/uL (ref 0.7–3.1)
Lymphs: 45 %
MCH: 27 pg (ref 26.6–33.0)
MCHC: 32.6 g/dL (ref 31.5–35.7)
MCV: 83 fL (ref 79–97)
Monocytes Absolute: 0.4 10*3/uL (ref 0.1–0.9)
Monocytes: 9 %
Neutrophils Absolute: 2 10*3/uL (ref 1.4–7.0)
Neutrophils: 42 %
Platelets: 270 10*3/uL (ref 150–450)
RBC: 5 x10E6/uL (ref 3.77–5.28)
RDW: 12.5 % (ref 11.7–15.4)
WBC: 4.7 10*3/uL (ref 3.4–10.8)

## 2022-01-25 LAB — LIPID PANEL WITH LDL/HDL RATIO
Cholesterol, Total: 222 mg/dL — ABNORMAL HIGH (ref 100–199)
HDL: 46 mg/dL (ref >39)
LDL Chol Calc (NIH): 153 mg/dL — ABNORMAL HIGH (ref 0–99)
LDL/HDL Ratio: 3.3 ratio — ABNORMAL HIGH (ref 0.0–3.2)
Triglycerides: 130 mg/dL (ref 0–149)
VLDL Cholesterol Cal: 23 mg/dL (ref 5–40)

## 2022-01-25 LAB — HEMOGLOBIN A1C
Est. average glucose Bld gHb Est-mCnc: 148 mg/dL
Hgb A1c MFr Bld: 6.8 % — ABNORMAL HIGH (ref 4.8–5.6)

## 2022-01-25 LAB — VITAMIN D 25 HYDROXY (VIT D DEFICIENCY, FRACTURES): Vit D, 25-Hydroxy: 28.4 ng/mL — ABNORMAL LOW (ref 30.0–100.0)

## 2022-01-25 MED ORDER — ROSUVASTATIN CALCIUM 5 MG PO TABS: 5.0000 mg | ORAL_TABLET | Freq: Every day | ORAL | 3 refills | Status: DC

## 2022-01-30 ENCOUNTER — Other Ambulatory Visit: Payer: Self-pay | Admitting: Family Medicine

## 2022-01-30 DIAGNOSIS — E0843 Diabetes mellitus due to underlying condition with diabetic autonomic (poly)neuropathy: Secondary | ICD-10-CM

## 2022-01-31 NOTE — Telephone Encounter (Signed)
Requested Prescriptions  ?Pending Prescriptions Disp Refills  ?? gabapentin (NEURONTIN) 600 MG tablet [Pharmacy Med Name: Gabapentin 600 MG Oral Tablet] 180 tablet 0  ?  Sig: TAKE 2 TABLETS BY MOUTH AT BEDTIME  ?  ? Neurology: Anticonvulsants - gabapentin Passed - 01/30/2022  9:27 PM  ?  ?  Passed - Cr in normal range and within 360 days  ?  Creatinine  ?Date Value Ref Range Status  ?09/23/2012 0.92 0.60 - 1.30 mg/dL Final  ? ?Creatinine, Ser  ?Date Value Ref Range Status  ?01/24/2022 0.91 0.57 - 1.00 mg/dL Final  ?   ?  ?  Passed - Completed PHQ-2 or PHQ-9 in the last 360 days  ?  ?  Passed - Valid encounter within last 12 months  ?  Recent Outpatient Visits   ?      ? 1 week ago Encounter for annual health examination  ? [facility] Tally Joe T, FNP  ? 1 month ago Type 2 diabetes mellitus with other specified complication, without long-term current use of insulin ([place])  ? [facility] Tally Joe T, FNP  ? 6 months ago Type 2 diabetes mellitus with other specified complication, without long-term current use of insulin ([place])  ? [facility] [place], Dionne Bucy, MD  ? 1 year ago Annual physical exam  ? [facility] Carles Collet M, [place]  ? 1 year ago Allergic rhinitis due to pollen, unspecified seasonality  ? [facility] [place], [place] M, [place]  ?  ?  ? ?  ?  ?  ? ? ?

## 2022-03-13 ENCOUNTER — Other Ambulatory Visit (INDEPENDENT_AMBULATORY_CARE_PROVIDER_SITE_OTHER): Payer: Self-pay | Admitting: Nurse Practitioner

## 2022-03-13 DIAGNOSIS — R0989 Other specified symptoms and signs involving the circulatory and respiratory systems: Secondary | ICD-10-CM

## 2022-03-15 ENCOUNTER — Encounter (INDEPENDENT_AMBULATORY_CARE_PROVIDER_SITE_OTHER): Payer: Self-pay | Admitting: Vascular Surgery

## 2022-03-15 ENCOUNTER — Ambulatory Visit (INDEPENDENT_AMBULATORY_CARE_PROVIDER_SITE_OTHER): Payer: BC Managed Care – PPO | Admitting: Vascular Surgery

## 2022-03-15 ENCOUNTER — Ambulatory Visit (INDEPENDENT_AMBULATORY_CARE_PROVIDER_SITE_OTHER): Payer: BC Managed Care – PPO

## 2022-03-15 VITALS — BP 141/80 | HR 65 | Resp 17 | Ht 64.0 in | Wt 240.6 lb

## 2022-03-15 DIAGNOSIS — E1169 Type 2 diabetes mellitus with other specified complication: Secondary | ICD-10-CM | POA: Diagnosis not present

## 2022-03-15 DIAGNOSIS — R0989 Other specified symptoms and signs involving the circulatory and respiratory systems: Secondary | ICD-10-CM

## 2022-03-15 DIAGNOSIS — I152 Hypertension secondary to endocrine disorders: Secondary | ICD-10-CM

## 2022-03-15 DIAGNOSIS — E1159 Type 2 diabetes mellitus with other circulatory complications: Secondary | ICD-10-CM | POA: Diagnosis not present

## 2022-03-15 DIAGNOSIS — E785 Hyperlipidemia, unspecified: Secondary | ICD-10-CM

## 2022-03-15 NOTE — Assessment & Plan Note (Signed)
lipid control important in reducing the progression of atherosclerotic disease. Continue statin therapy  

## 2022-03-15 NOTE — Assessment & Plan Note (Signed)
Her ABIs today are 1.16 on the right and 1.17 on the left with multiphasic waveforms and normal digital pressures and waveforms bilaterally consistent with no arterial insufficiency.  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  No role for intervention or invasive vascular evaluation at this point.  Follow-up as needed ?

## 2022-03-15 NOTE — Assessment & Plan Note (Signed)
blood glucose control important in reducing the progression of atherosclerotic disease. Also, involved in wound healing. On appropriate medications.  

## 2022-03-15 NOTE — Progress Notes (Signed)
? ? ?Patient ID: Julia Robertson, female   DOB: 1956-01-05, 66 y.o.   MRN: [id number] ? ?Chief Complaint  ?Patient presents with  ? Establish Care  ?  Referred by Dr Rollene Rotunda  ? ? ?HPI ?Julia Robertson is a 66 y.o. female.  I am asked to see the patient by Dickie La, FNP for evaluation of numbness and poorly palpable pedal pulses.  She has more symptoms on the right than the left but has been bothered by progressive numbness particular in her foot and ankle for several months.  No ulceration or infection.  No fevers or chills.  Long history of diabetes.  Multiple atherosclerotic risk factors including diabetes, hypertension, and hyperlipidemia.  Given these findings, she is appropriately referred for evaluation of her lower extremity arterial perfusion.  Her ABIs today are 1.16 on the right and 1.17 on the left with multiphasic waveforms and normal digital pressures and waveforms bilaterally consistent with no arterial insufficiency.   ? ? ?Past Medical History:  ?Diagnosis Date  ? Diabetes mellitus without complication ([place])   ? Hyperlipemia   ? Hypertension   ? Neuromuscular disorder ([place])   ? Obesity   ? ? ?Past Surgical History:  ?Procedure Laterality Date  ? CHOLECYSTECTOMY    ? COLONOSCOPY WITH PROPOFOL N/A 11/28/2015  ? Procedure: COLONOSCOPY WITH PROPOFOL;  Surgeon: Lucilla Lame, MD;  Location: [facility] ENDOSCOPY;  Service: Endoscopy;  Laterality: N/A;  ? COLONOSCOPY WITH PROPOFOL N/A 02/27/2021  ? Procedure: COLONOSCOPY WITH PROPOFOL;  Surgeon: Lucilla Lame, MD;  Location: [facility] ENDOSCOPY;  Service: Endoscopy;  Laterality: N/A;  ? 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 Dr. Rudene Christians at [facility]  ? ? ? ?Family History  ?Problem Relation Age of Onset  ? Cancer Mother   ?     pancreatic  ? Cancer Father   ?     prostate  ? Hypertension Sister   ? Hypertension Brother   ? Kidney disease Brother   ? Hypertension Brother   ? Kidney disease Brother   ? Hypertension Brother   ? Diabetes Brother   ? Hypertension Brother   ?  Breast cancer Neg Hx   ? ? ? ? ?Social History  ? ?Tobacco Use  ? Smoking status: Never  ? Smokeless tobacco: Never  ?Vaping Use  ? Vaping Use: Never used  ?Substance Use Topics  ? Alcohol use: No  ? Drug use: No  ? ? ? ?Allergies  ?Allergen Reactions  ? Mucinex [Guaifenesin Er] Other (See Comments)  ?  syncope  ? ? ?Current Outpatient Medications  ?Medication Sig Dispense Refill  ? amLODipine (NORVASC) 10 MG tablet Take 1 tablet (10 mg total) by mouth daily. 90 tablet 3  ? amoxicillin (AMOXIL) 500 MG capsule Take 500 mg by mouth every 6 (six) hours.    ? diclofenac Sodium (VOLTAREN) 1 % GEL Apply 4 g topically 4 (four) times daily. 150 g 0  ? Dulaglutide (TRULICITY) 1.5 SL/3.7DS SOPN Inject 1.5 mg into the skin once a week. 6 mL 3  ? gabapentin (NEURONTIN) 600 MG tablet TAKE 2 TABLETS BY MOUTH AT BEDTIME 180 tablet 1  ? hydrochlorothiazide (HYDRODIURIL) 50 MG tablet Take 1 tablet (50 mg total) by mouth daily. 90 tablet 3  ? ibuprofen (ADVIL) 600 MG tablet Take 600 mg by mouth every 6 (six) hours as needed.    ? lisinopril (ZESTRIL) 20 MG tablet Take 2 tablets (40 mg total) by mouth daily. 90 tablet 3  ?  metFORMIN (GLUCOPHAGE) 1000 MG tablet Take 1 tablet (1,000 mg total) by mouth 2 (two) times daily with a meal. 180 tablet 3  ? Multiple Vitamin (MULTIVITAMIN) capsule Take 1 capsule by mouth daily.    ? rosuvastatin (CRESTOR) 5 MG tablet Take 1 tablet (5 mg total) by mouth daily. 90 tablet 3  ? traZODone (DESYREL) 100 MG tablet Take 1 tablet (100 mg total) by mouth at bedtime. 90 tablet 3  ? ?No current facility-administered medications for this visit.  ? ? ? ? ?REVIEW OF SYSTEMS (Negative unless checked) ? ?Constitutional: '[]' Weight loss  '[]' Fever  '[]' Chills ?Cardiac: '[]' Chest pain   '[]' Chest pressure   '[]' Palpitations   '[]' Shortness of breath when laying flat   '[]' Shortness of breath at rest   '[]' Shortness of breath with exertion. ?Vascular:  '[]' Pain in legs with walking   '[]' Pain in legs at rest   '[]' Pain in legs when laying  flat   '[]' Claudication   '[]' Pain in feet when walking  '[]' Pain in feet at rest  '[]' Pain in feet when laying flat   '[]' History of DVT   '[]' Phlebitis   '[]' Swelling in legs   '[]' Varicose veins   '[]' Non-healing ulcers ?Pulmonary:   '[]' Uses home oxygen   '[]' Productive cough   '[]' Hemoptysis   '[]' Wheeze  '[]' COPD   '[]' Asthma ?Neurologic:  '[]' Dizziness  '[]' Blackouts   '[]' Seizures   '[]' History of stroke   '[]' History of TIA  '[]' Aphasia   '[]' Temporary blindness   '[]' Dysphagia   '[]' Weakness or numbness in arms   '[x]' Weakness or numbness in legs ?Musculoskeletal:  '[x]' Arthritis   '[]' Joint swelling   '[x]' Joint pain   '[]' Low back pain ?Hematologic:  '[]' Easy bruising  '[]' Easy bleeding   '[]' Hypercoagulable state   '[]' Anemic  '[]' Hepatitis ?Gastrointestinal:  '[]' Blood in stool   '[]' Vomiting blood  '[]' Gastroesophageal reflux/heartburn   '[]' Abdominal pain ?Genitourinary:  '[]' Chronic kidney disease   '[]' Difficult urination  '[]' Frequent urination  '[]' Burning with urination   '[]' Hematuria ?Skin:  '[]' Rashes   '[]' Ulcers   '[]' Wounds ?Psychological:  '[]' History of anxiety   '[]'  History of major depression. ? ? ? ?Physical Exam ?BP (!) 141/80 (BP Location: Right Arm)   Pulse 65   Resp 17   Ht '5\' 4"'  (1.626 m)   Wt 240 lb 9.6 oz (109.1 kg)   BMI 41.30 kg/m?  ?Gen:  WD/WN, NAD. Obese.  ?Head: [place]/AT, No temporalis wasting.  ?Ear/Nose/Throat: Hearing grossly intact, nares w/o erythema or drainage, oropharynx w/o Erythema/Exudate ?Eyes: Conjunctiva clear, sclera non-icteric  ?Neck: trachea midline.  No JVD.  ?Pulmonary:  Good air movement, respirations not labored, no use of accessory muscles  ?Cardiac: RRR, no JVD ?Vascular:  ?Vessel Right Left  ?Radial Palpable Palpable  ?    ?    ?    ?    ?    ?    ?DP 1+ 2+  ?PT 1+ 1+  ? ?Gastrointestinal:. No masses, surgical incisions, or scars. ?Musculoskeletal: M/S 5/5 throughout.  Extremities without ischemic changes.  No deformity or atrophy. Trace LE edema. ?Neurologic: Sensation grossly intact in extremities.  Symmetrical.  Speech is fluent. Motor  exam as listed above. ?Psychiatric: Judgment intact, Mood & affect appropriate for pt's clinical situation. ?Dermatologic: No rashes or ulcers noted.  No cellulitis or open wounds. ? ? ? ?Radiology ?No results found. ? ?Labs ?Recent Results (from the past 2160 hour(s))  ?CBC with Differential/Platelet     Status: None  ? Collection Time: 01/24/22  9:28 AM  ?Result Value Ref Range  ? WBC  4.7 3.4 - 10.8 x10E3/uL  ? RBC 5.00 3.77 - 5.28 x10E6/uL  ? Hemoglobin 13.5 11.1 - 15.9 g/dL  ? Hematocrit 41.4 34.0 - 46.6 %  ? MCV 83 79 - 97 fL  ? MCH 27.0 26.6 - 33.0 pg  ? MCHC 32.6 31.5 - 35.7 g/dL  ? RDW 12.5 11.7 - 15.4 %  ? Platelets 270 150 - 450 x10E3/uL  ? Neutrophils 42 Not Estab. %  ? Lymphs 45 Not Estab. %  ? Monocytes 9 Not Estab. %  ? Eos 3 Not Estab. %  ? Basos 1 Not Estab. %  ? Neutrophils Absolute 2.0 1.4 - 7.0 x10E3/uL  ? Lymphocytes Absolute 2.1 0.7 - 3.1 x10E3/uL  ? Monocytes Absolute 0.4 0.1 - 0.9 x10E3/uL  ? EOS (ABSOLUTE) 0.1 0.0 - 0.4 x10E3/uL  ? Basophils Absolute 0.1 0.0 - 0.2 x10E3/uL  ? Immature Granulocytes 0 Not Estab. %  ? Immature Grans (Abs) 0.0 0.0 - 0.1 x10E3/uL  ?Comprehensive metabolic panel     Status: Abnormal  ? Collection Time: 01/24/22  9:28 AM  ?Result Value Ref Range  ? Glucose 140 (H) 70 - 99 mg/dL  ? BUN 15 8 - 27 mg/dL  ? Creatinine, Ser 0.91 0.57 - 1.00 mg/dL  ? eGFR 71 >59 mL/min/1.73  ? BUN/Creatinine Ratio 16 12 - 28  ? Sodium 141 134 - 144 mmol/L  ? Potassium 4.2 3.5 - 5.2 mmol/L  ? Chloride 99 96 - 106 mmol/L  ? CO2 27 20 - 29 mmol/L  ? Calcium 9.6 8.7 - 10.3 mg/dL  ? Total Protein 8.2 6.0 - 8.5 g/dL  ? Albumin 4.5 3.8 - 4.8 g/dL  ? Globulin, Total 3.7 1.5 - 4.5 g/dL  ? Albumin/Globulin Ratio 1.2 1.2 - 2.2  ? Bilirubin Total 0.4 0.0 - 1.2 mg/dL  ? Alkaline Phosphatase 85 44 - 121 IU/L  ? AST 18 0 - 40 IU/L  ? ALT 21 0 - 32 IU/L  ?Lipid Panel With LDL/HDL Ratio     Status: Abnormal  ? Collection Time: 01/24/22  9:28 AM  ?Result Value Ref Range  ? Cholesterol, Total 222 (H) 100 -  199 mg/dL  ? Triglycerides 130 0 - 149 mg/dL  ? HDL 46 >39 mg/dL  ? VLDL Cholesterol Cal 23 5 - 40 mg/dL  ? LDL Chol Calc (NIH) 153 (H) 0 - 99 mg/dL  ? LDL/HDL Ratio 3.3 (H) 0.0 - 3.2 ratio  ?  Comment:

## 2022-03-15 NOTE — Assessment & Plan Note (Signed)
blood pressure control important in reducing the progression of atherosclerotic disease. On appropriate oral medications.  

## 2022-07-05 ENCOUNTER — Telehealth: Payer: Self-pay

## 2022-07-05 ENCOUNTER — Other Ambulatory Visit: Payer: Self-pay | Admitting: Family Medicine

## 2022-07-05 DIAGNOSIS — E119 Type 2 diabetes mellitus without complications: Secondary | ICD-10-CM

## 2022-07-05 DIAGNOSIS — E1159 Type 2 diabetes mellitus with other circulatory complications: Secondary | ICD-10-CM

## 2022-07-05 MED ORDER — SEMAGLUTIDE (1 MG/DOSE) 4 MG/3ML ~~LOC~~ SOPN: 1.0000 mg | PEN_INJECTOR | SUBCUTANEOUS | 0 refills | Status: DC

## 2022-07-05 NOTE — Telephone Encounter (Signed)
Called patient to let her know her PA for trulicity was rejected.

## 2022-07-05 NOTE — Telephone Encounter (Signed)
Pt is calling back and would like to know what is she to take? Please advise [phone]

## 2022-08-30 ENCOUNTER — Other Ambulatory Visit: Payer: Self-pay | Admitting: Family Medicine

## 2022-08-30 DIAGNOSIS — E0843 Diabetes mellitus due to underlying condition with diabetic autonomic (poly)neuropathy: Secondary | ICD-10-CM

## 2022-09-17 DIAGNOSIS — H2512 Age-related nuclear cataract, left eye: Secondary | ICD-10-CM | POA: Diagnosis not present

## 2022-09-23 ENCOUNTER — Encounter (INDEPENDENT_AMBULATORY_CARE_PROVIDER_SITE_OTHER): Payer: Self-pay

## 2022-10-07 ENCOUNTER — Encounter: Payer: Self-pay | Admitting: Ophthalmology

## 2022-10-08 ENCOUNTER — Encounter: Payer: Self-pay | Admitting: Ophthalmology

## 2022-10-08 NOTE — Discharge Instructions (Signed)

## 2022-10-09 ENCOUNTER — Encounter: Payer: Self-pay | Admitting: Ophthalmology

## 2022-10-09 ENCOUNTER — Encounter
Admission: RE | Disposition: A | Discharge status: Home / Self Care | Payer: Self-pay | Source: Home / Self Care | Attending: Ophthalmology

## 2022-10-09 ENCOUNTER — Other Ambulatory Visit: Payer: Self-pay

## 2022-10-09 ENCOUNTER — Ambulatory Visit
Admission: RE | Admit: 2022-10-09 | Discharge: 2022-10-09 | Disposition: A | Discharge status: Home / Self Care | Payer: BC Managed Care – PPO | Attending: Ophthalmology | Admitting: Ophthalmology

## 2022-10-09 ENCOUNTER — Ambulatory Visit: Payer: BC Managed Care – PPO | Admitting: Anesthesiology

## 2022-10-09 DIAGNOSIS — Z9049 Acquired absence of other specified parts of digestive tract: Secondary | ICD-10-CM | POA: Insufficient documentation

## 2022-10-09 DIAGNOSIS — Z7984 Long term (current) use of oral hypoglycemic drugs: Secondary | ICD-10-CM | POA: Insufficient documentation

## 2022-10-09 DIAGNOSIS — Z6841 Body Mass Index (BMI) 40.0 and over, adult: Secondary | ICD-10-CM | POA: Diagnosis not present

## 2022-10-09 DIAGNOSIS — E785 Hyperlipidemia, unspecified: Secondary | ICD-10-CM | POA: Diagnosis not present

## 2022-10-09 DIAGNOSIS — I10 Essential (primary) hypertension: Secondary | ICD-10-CM | POA: Diagnosis not present

## 2022-10-09 DIAGNOSIS — H2512 Age-related nuclear cataract, left eye: Secondary | ICD-10-CM | POA: Diagnosis not present

## 2022-10-09 DIAGNOSIS — E669 Obesity, unspecified: Secondary | ICD-10-CM | POA: Insufficient documentation

## 2022-10-09 DIAGNOSIS — E1136 Type 2 diabetes mellitus with diabetic cataract: Secondary | ICD-10-CM | POA: Insufficient documentation

## 2022-10-09 HISTORY — PX: CATARACT EXTRACTION W/PHACO: SHX586

## 2022-10-09 LAB — GLUCOSE, CAPILLARY: Glucose-Capillary: 117 mg/dL — ABNORMAL HIGH (ref 70–99)

## 2022-10-09 SURGERY — PHACOEMULSIFICATION, CATARACT, WITH IOL INSERTION
Anesthesia: Monitor Anesthesia Care | Site: Eye | Laterality: Left

## 2022-10-09 MED ORDER — MIDAZOLAM HCL 2 MG/2ML IJ SOLN
INTRAMUSCULAR | Status: DC | PRN
  Administered 2022-10-09: 2 mg via INTRAVENOUS

## 2022-10-09 MED ORDER — FENTANYL CITRATE (PF) 100 MCG/2ML IJ SOLN
INTRAMUSCULAR | Status: DC | PRN
  Administered 2022-10-09 (×2): 25 ug via INTRAVENOUS

## 2022-10-09 MED ORDER — BRIMONIDINE TARTRATE-TIMOLOL 0.2-0.5 % OP SOLN
OPHTHALMIC | Status: DC | PRN
  Administered 2022-10-09: 1 [drp] via OPHTHALMIC

## 2022-10-09 MED ORDER — SIGHTPATH DOSE#1 BSS IO SOLN
INTRAOCULAR | Status: DC | PRN
  Administered 2022-10-09: 1 mL via INTRAMUSCULAR

## 2022-10-09 MED ORDER — TETRACAINE HCL 0.5 % OP SOLN
1.0000 [drp] | OPHTHALMIC | Status: DC | PRN
  Administered 2022-10-09 (×3): 1 [drp] via OPHTHALMIC

## 2022-10-09 MED ORDER — SIGHTPATH DOSE#1 NA HYALUR & NA CHOND-NA HYALUR IO KIT
PACK | INTRAOCULAR | Status: DC | PRN
  Administered 2022-10-09: 1 via OPHTHALMIC

## 2022-10-09 MED ORDER — ARMC OPHTHALMIC DILATING DROPS
1.0000 | OPHTHALMIC | Status: DC | PRN
  Administered 2022-10-09 (×3): 1 via OPHTHALMIC

## 2022-10-09 MED ORDER — CEFUROXIME OPHTHALMIC INJECTION 1 MG/0.1 ML
INJECTION | OPHTHALMIC | Status: DC | PRN
  Administered 2022-10-09: .1 mL via INTRACAMERAL

## 2022-10-09 MED ORDER — SIGHTPATH DOSE#1 BSS IO SOLN
INTRAOCULAR | Status: DC | PRN
  Administered 2022-10-09: 39 mL via OPHTHALMIC

## 2022-10-09 MED ORDER — SIGHTPATH DOSE#1 BSS IO SOLN
INTRAOCULAR | Status: DC | PRN
  Administered 2022-10-09: 15 mL

## 2022-10-09 SURGICAL SUPPLY — 10 items
CATARACT SUITE SIGHTPATH (MISCELLANEOUS) ×1 IMPLANT
FEE CATARACT SUITE SIGHTPATH (MISCELLANEOUS) ×1 IMPLANT
GLOVE SRG 8 PF TXTR STRL LF DI (GLOVE) ×1 IMPLANT
GLOVE SURG ENC TEXT LTX SZ7.5 (GLOVE) ×1 IMPLANT
GLOVE SURG UNDER POLY LF SZ8 (GLOVE) ×1
LENS IOL TECNIS EYHANCE 20.0 (Intraocular Lens) IMPLANT
NDL FILTER BLUNT 18X1 1/2 (NEEDLE) ×1 IMPLANT
NEEDLE FILTER BLUNT 18X1 1/2 (NEEDLE) ×1 IMPLANT
SYR 3ML LL SCALE MARK (SYRINGE) ×1 IMPLANT
WATER STERILE IRR 250ML POUR (IV SOLUTION) ×1 IMPLANT

## 2022-10-09 NOTE — H&P (Signed)
[facility]   Primary Care Physician:  Gwyneth Sprout, FNP Ophthalmologist: Dr. Leandrew Koyanagi  Pre-Procedure History & Physical: HPI:  Julia Robertson is a 66 y.o. female here for ophthalmic surgery.   Past Medical History:  Diagnosis Date   Diabetes mellitus without complication ([place])    Hyperlipemia    Hypertension    Neuromuscular disorder ([place])    Obesity     Past Surgical History:  Procedure Laterality Date   CHOLECYSTECTOMY     COLONOSCOPY WITH PROPOFOL N/A 11/28/2015   Procedure: COLONOSCOPY WITH PROPOFOL;  Surgeon: Lucilla Lame, MD;  Location: [facility] ENDOSCOPY;  Service: Endoscopy;  Laterality: N/A;   COLONOSCOPY WITH PROPOFOL N/A 02/27/2021   Procedure: COLONOSCOPY WITH PROPOFOL;  Surgeon: Lucilla Lame, MD;  Location: [facility] ENDOSCOPY;  Service: Endoscopy;  Laterality: N/A;   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 Rudene [facility] at [facility]    Prior to Admission medications   Medication Sig Start Date End Date Taking? Authorizing Provider  amLODipine (NORVASC) 10 MG tablet Take 1 tablet (10 mg total) by mouth daily. 12/05/21  Yes Tally Joe T, FNP  diclofenac Sodium (VOLTAREN) 1 % GEL Apply 4 g topically 4 (four) times daily. 12/05/21  Yes Tally Joe T, FNP  gabapentin (NEURONTIN) 600 MG tablet TAKE 2 TABLETS BY MOUTH AT BEDTIME 08/30/22  Yes Gwyneth Sprout, FNP  hydrochlorothiazide (HYDRODIURIL) 50 MG tablet Take 1 tablet (50 mg total) by mouth daily. 12/05/21  Yes Tally Joe T, FNP  ibuprofen (ADVIL) 600 MG tablet Take 600 mg by mouth every 6 (six) hours as needed. 03/12/22  Yes [provider]  lisinopril (ZESTRIL) 20 MG tablet Take 2 tablets (40 mg total) by mouth daily. 12/05/21  Yes Gwyneth Sprout, FNP  metFORMIN (GLUCOPHAGE) 1000 MG tablet Take 1 tablet (1,000 mg total) by mouth 2 (two) times daily with a meal. 12/05/21  Yes Gwyneth Sprout, FNP  Multiple Vitamin (MULTIVITAMIN) capsule Take 1 capsule by mouth daily.   Yes [provider]   rosuvastatin (CRESTOR) 5 MG tablet Take 1 tablet (5 mg total) by mouth daily. 01/25/22  Yes Gwyneth Sprout, FNP  Semaglutide, 1 MG/DOSE, 4 MG/3ML SOPN Inject 1 mg as directed once a week. 07/05/22  Yes Gwyneth Sprout, FNP  traZODone (DESYREL) 100 MG tablet Take 1 tablet (100 mg total) by mouth at bedtime. 12/05/21  Yes Gwyneth Sprout, FNP  amoxicillin (AMOXIL) 500 MG capsule Take 500 mg by mouth every 6 (six) hours. Patient not taking: Reported on 10/07/2022 03/12/22   [provider]    Allergies as of 09/13/2022 - Review Complete 03/15/2022  Allergen Reaction Noted   Mucinex [guaifenesin er] Other (See Comments) 04/08/2019    Family History  Problem Relation Age of Onset   Cancer Mother        pancreatic   Cancer Father        prostate   Hypertension Sister    Hypertension Brother    Kidney disease Brother    Hypertension Brother    Kidney disease Brother    Hypertension Brother    Diabetes Brother    Hypertension Brother    Breast cancer Neg Hx     Social History   Socioeconomic History   Marital status: Married    Spouse name: Not on file   Number of children: Not on file   Years of education: Not on file   Highest education level: Not on file  Occupational  History   Occupation: Psychologist, 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, [place]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) 143/99   Ht '5\' 5"'$  (1.651 m)   Wt 111.5 kg   SpO2 97%   BMI 40.90 kg/m  General:   Alert,  pleasant and cooperative in NAD Head:  Normocephalic and atraumatic. Lungs:  Clear to auscultation.    Heart:  Regular rate and rhythm.   Impression/Plan: Julia Robertson is here for ophthalmic surgery.  Risks, benefits, limitations, and alternatives regarding ophthalmic surgery have been reviewed with the patient.  Questions have been answered.  All parties agreeable.   Leandrew Koyanagi, MD  10/09/2022, 11:29 AM

## 2022-10-09 NOTE — Anesthesia Preprocedure Evaluation (Signed)
Anesthesia Evaluation  Patient identified by MRN, date of birth, ID band Patient awake    Reviewed: Allergy & Precautions, NPO status , Patient's Chart, lab work & pertinent test results  History of Anesthesia Complications Negative for: history of anesthetic complications  Airway Mallampati: III  TM Distance: >3 FB Neck ROM: full    Dental  (+) Chipped, Poor Dentition   Pulmonary neg pulmonary ROS   Pulmonary exam normal        Cardiovascular hypertension, On Medications negative cardio ROS Normal cardiovascular exam     Neuro/Psych  Neuromuscular disease  negative psych ROS   GI/Hepatic negative GI ROS, Neg liver ROS,,,  Endo/Other  negative endocrine ROSdiabetes    Renal/GU      Musculoskeletal   Abdominal   Peds  Hematology negative hematology ROS (+)   Anesthesia Other Findings Past Medical History: No date: Diabetes mellitus without complication (HCC) No date: Hyperlipemia No date: Hypertension No date: Neuromuscular disorder (HCC) No date: Obesity  Past Surgical History: No date: CHOLECYSTECTOMY 11/28/2015: COLONOSCOPY WITH PROPOFOL; N/A     Comment:  Procedure: COLONOSCOPY WITH PROPOFOL;  Surgeon: Lucilla Lame, MD;  Location: [facility] ENDOSCOPY;  Service: Endoscopy;              Laterality: N/A; 02/27/2021: COLONOSCOPY WITH PROPOFOL; N/A     Comment:  Procedure: COLONOSCOPY WITH PROPOFOL;  Surgeon: Lucilla Lame, MD;  Location: [facility] ENDOSCOPY;  Service:               Endoscopy;  Laterality: N/A; 08/2012: REPLACEMENT TOTAL KNEE; Right     Comment:  Dr. Rudene Christians at [facility]  BMI    Body Mass Index: 38.61 kg/m      Reproductive/Obstetrics negative OB ROS                             Anesthesia Physical Anesthesia Plan  ASA: 3  Anesthesia Plan: MAC   Post-op Pain Management: Minimal or no pain anticipated   Induction: Intravenous  PONV Risk Score and  Plan:   Airway Management Planned: Natural Airway and Nasal Cannula  Additional Equipment:   Intra-op Plan:   Post-operative Plan:   Informed Consent: I have reviewed the patients History and Physical, chart, labs and discussed the procedure including the risks, benefits and alternatives for the proposed anesthesia with the patient or authorized representative who has indicated his/her understanding and acceptance.     Dental Advisory Given  Plan Discussed with: Anesthesiologist, CRNA and Surgeon  Anesthesia Plan Comments: (Patient consented for risks of anesthesia including but not limited to:  - adverse reactions to medications - damage to eyes, teeth, lips or other oral mucosa - nerve damage due to positioning  - sore throat or hoarseness - Damage to heart, brain, nerves, lungs, other parts of body or loss of life  Patient voiced understanding.)       Anesthesia Quick Evaluation

## 2022-10-09 NOTE — Transfer of Care (Signed)
Immediate Anesthesia Transfer of Care Note  Patient: Julia Robertson  Procedure(s) Performed: CATARACT EXTRACTION PHACO AND INTRAOCULAR LENS PLACEMENT (IOC) LEFT DIABETIC  2.09  00:22.4 (Left: Eye)  Patient Location: PACU  Anesthesia Type: MAC  Level of Consciousness: awake, alert  and patient cooperative  Airway and Oxygen Therapy: Patient Spontanous Breathing and Patient connected to supplemental oxygen  Post-op Assessment: Post-op Vital signs reviewed, Patient's Cardiovascular Status Stable, Respiratory Function Stable, Patent Airway and No signs of Nausea or vomiting  Post-op Vital Signs: Reviewed and stable  Complications: No notable events documented.

## 2022-10-09 NOTE — Anesthesia Postprocedure Evaluation (Signed)
Anesthesia Post Note  Patient: Katriana Dortch  Procedure(s) Performed: CATARACT EXTRACTION PHACO AND INTRAOCULAR LENS PLACEMENT (IOC) LEFT DIABETIC  2.09  00:22.4 (Left: Eye)  Patient location during evaluation: PACU Anesthesia Type: MAC Level of consciousness: awake and alert Pain management: pain level controlled Vital Signs Assessment: post-procedure vital signs reviewed and stable Respiratory status: spontaneous breathing, nonlabored ventilation, respiratory function stable and patient connected to nasal cannula oxygen Cardiovascular status: stable and blood pressure returned to baseline Postop Assessment: no apparent nausea or vomiting Anesthetic complications: no   No notable events documented.   Last Vitals:  Vitals:   10/09/22 1032 10/09/22 1209  BP: (!) 143/99 (!) 143/82  Pulse:  70  Resp:  18  Temp:  (!) 36.4 C  SpO2: 97% 97%    Last Pain:  Vitals:   10/09/22 1032  PainSc: 0-No pain                 Ilene Qua

## 2022-10-09 NOTE — Op Note (Signed)
  OPERATIVE NOTE  Julia Robertson [id number] 10/09/2022   PREOPERATIVE DIAGNOSIS:  Nuclear sclerotic cataract left eye. H25.12   POSTOPERATIVE DIAGNOSIS:    Nuclear sclerotic cataract left eye.     PROCEDURE:  Phacoemusification with posterior chamber intraocular lens placement of the left eye  Ultrasound time: Procedure(s) with comments: CATARACT EXTRACTION PHACO AND INTRAOCULAR LENS PLACEMENT (IOC) LEFT DIABETIC  2.09  00:22.4 (Left) - Diabetic  LENS:   Implant Name Type Inv. Item Serial No. Manufacturer Lot No. LRB No. Used Action  LENS IOL TECNIS EYHANCE 20.0 - [id number] Intraocular Lens LENS IOL TECNIS EYHANCE 20.0 [id number] [employer]  Left 1 Implanted      SURGEON:  Wyonia Hough, MD   ANESTHESIA:  Topical with tetracaine drops and 2% Xylocaine jelly, augmented with 1% preservative-free intracameral lidocaine.    COMPLICATIONS:  None.   DESCRIPTION OF PROCEDURE:  The patient was identified in the holding room and transported to the operating room and placed in the supine position under the operating microscope.  The left eye was identified as the operative eye and it was prepped and draped in the usual sterile ophthalmic fashion.   A 1 millimeter clear-corneal paracentesis was made at the 1:30 position.  0.5 ml of preservative-free 1% lidocaine was injected into the anterior chamber.  The anterior chamber was filled with Viscoat viscoelastic.  A 2.4 millimeter keratome was used to make a near-clear corneal incision at the 10:30 position.  .  A curvilinear capsulorrhexis was made with a cystotome and capsulorrhexis forceps.  Balanced salt solution was used to hydrodissect and hydrodelineate the nucleus.   Phacoemulsification was then used in stop and chop fashion to remove the lens nucleus and epinucleus.  The remaining cortex was then removed using the irrigation and aspiration handpiece. Provisc was then placed into the capsular bag to distend it for lens  placement.  A lens was then injected into the capsular bag.  The remaining viscoelastic was aspirated.   Wounds were hydrated with balanced salt solution.  The anterior chamber was inflated to a physiologic pressure with balanced salt solution.  No wound leaks were noted. Cefuroxime 0.1 ml of a '10mg'$ /ml solution was injected into the anterior chamber for a dose of 1 mg of intracameral antibiotic at the completion of the case.   Timolol and Brimonidine drops were applied to the eye.  The patient was taken to the recovery room in stable condition without complications of anesthesia or surgery.  [name] 10/09/2022, 12:07 PM

## 2022-10-10 ENCOUNTER — Encounter: Payer: Self-pay | Admitting: Ophthalmology

## 2022-10-23 ENCOUNTER — Ambulatory Visit: Admit: 2022-10-23 | Payer: BC Managed Care – PPO | Admitting: Ophthalmology

## 2022-10-23 SURGERY — PHACOEMULSIFICATION, CATARACT, WITH IOL INSERTION
Anesthesia: Topical | Laterality: Right

## 2022-10-28 ENCOUNTER — Ambulatory Visit: Payer: BC Managed Care – PPO | Admitting: Family Medicine

## 2022-11-05 DIAGNOSIS — H524 Presbyopia: Secondary | ICD-10-CM | POA: Diagnosis not present

## 2022-11-19 ENCOUNTER — Encounter: Payer: Self-pay | Admitting: Family Medicine

## 2022-11-19 ENCOUNTER — Ambulatory Visit: Payer: BC Managed Care – PPO | Admitting: Family Medicine

## 2022-11-19 VITALS — BP 125/88 | HR 83 | Temp 98.8°F | Wt 242.5 lb

## 2022-11-19 DIAGNOSIS — J069 Acute upper respiratory infection, unspecified: Secondary | ICD-10-CM | POA: Diagnosis not present

## 2022-11-19 DIAGNOSIS — Z1239 Encounter for other screening for malignant neoplasm of breast: Secondary | ICD-10-CM | POA: Diagnosis not present

## 2022-11-19 LAB — POCT INFLUENZA A/B
Influenza A, POC: NEGATIVE
Influenza B, POC: NEGATIVE

## 2022-11-19 MED ORDER — ALBUTEROL SULFATE HFA 108 (90 BASE) MCG/ACT IN AERS: 2.0000 | INHALATION_SPRAY | Freq: Four times a day (QID) | RESPIRATORY_TRACT | 2 refills | Status: DC | PRN

## 2022-11-19 MED ORDER — PREDNISONE 50 MG PO TABS: 50.0000 mg | ORAL_TABLET | Freq: Every day | ORAL | 0 refills | Status: DC

## 2022-11-19 MED ORDER — SPACER/AERO-HOLDING CHAMBERS DEVI: 1.0000 | 0 refills | Status: DC | PRN

## 2022-11-19 MED ORDER — BENZONATATE 200 MG PO CAPS: 200.0000 mg | ORAL_CAPSULE | Freq: Two times a day (BID) | ORAL | 0 refills | Status: DC | PRN

## 2022-11-19 NOTE — Patient Instructions (Signed)
Please call and schedule your mammogram:  [facility] at [facility]  [place], [place],  [place]  [id number] Get Driving Directions Main: [phone]  Sunday:Closed Monday:7:20 AM - 5:00 PM Tuesday:7:20 AM - 5:00 PM Wednesday:7:20 AM - 5:00 PM Thursday:7:20 AM - 5:00 PM Friday:7:20 AM - 4:30 PM Saturday:Closed  Continue to drink water daily.  Goal of 64 oz/day minimum.  Can use OTC allergy medication, ex Zyrtec as well as nasal steroid, ex Flonase.  If you are congested you can use Saline Nasal Spray.  Use saline prior to using Flonase if needed.  You can use medications for cough like Delsym and for mucus, like Mucinex.  Can add lozenges or drink tea with local honey to help relieve symptoms.

## 2022-11-19 NOTE — Assessment & Plan Note (Signed)
Acute, negative COVID at work; negative flu in office Continue supportive measures; likely another viral cause given work in [facility] setting and symptoms described Use of albuterol with spacer, tessalon, and pred burst to assist RTC as needed

## 2022-11-19 NOTE — Progress Notes (Signed)
I,Connie R Striblin,acting as a Education administrator for Gwyneth Sprout, FNP.,have documented all relevant documentation on the behalf of Gwyneth Sprout, FNP,as directed by  Gwyneth Sprout, FNP while in the presence of Gwyneth Sprout, FNP.  Established patient visit   Patient: Julia Robertson   DOB: Dec 02, 1955   66 y.o. Female  MRN: [id number] Visit Date: 11/19/2022  Today's healthcare provider: Gwyneth Sprout, FNP  Introduced to nurse practitioner role and practice setting.  All questions answered.  Discussed provider/patient relationship and expectations.   Chief Complaint  Patient presents with   Cough        Subjective    HPI HPI     Cough    Additional comments:        Last edited by Araceli Bouche, CMA on 11/19/2022  2:09 PM.      Cough: Patient complains of cough. Symptoms began 4 days ago. Cough described as non-productive, with wheezing. Patient denies sneezing and sore throat. Associated symptoms include chills, dyspnea, fever, and nasal congestion. Current treatments have included  OTC cough medication , with poor improvement.    Negative covid test yesterday  Medications: Outpatient Medications Prior to Visit  Medication Sig   amLODipine (NORVASC) 10 MG tablet Take 1 tablet (10 mg total) by mouth daily.   amoxicillin (AMOXIL) 500 MG capsule Take 500 mg by mouth every 6 (six) hours.   diclofenac Sodium (VOLTAREN) 1 % GEL Apply 4 g topically 4 (four) times daily.   gabapentin (NEURONTIN) 600 MG tablet TAKE 2 TABLETS BY MOUTH AT BEDTIME   hydrochlorothiazide (HYDRODIURIL) 50 MG tablet Take 1 tablet (50 mg total) by mouth daily.   ibuprofen (ADVIL) 600 MG tablet Take 600 mg by mouth every 6 (six) hours as needed.   lisinopril (ZESTRIL) 20 MG tablet Take 2 tablets (40 mg total) by mouth daily.   metFORMIN (GLUCOPHAGE) 1000 MG tablet Take 1 tablet (1,000 mg total) by mouth 2 (two) times daily with a meal.   Multiple Vitamin (MULTIVITAMIN) capsule Take 1 capsule by  mouth daily.   rosuvastatin (CRESTOR) 5 MG tablet Take 1 tablet (5 mg total) by mouth daily.   Semaglutide, 1 MG/DOSE, 4 MG/3ML SOPN Inject 1 mg as directed once a week.   traZODone (DESYREL) 100 MG tablet Take 1 tablet (100 mg total) by mouth at bedtime.   No facility-administered medications prior to visit.    Review of Systems    Objective    BP 125/88 (BP Location: Right Arm, Patient Position: Sitting, Cuff Size: Large)   Pulse 83   Temp 98.8 F (37.1 C) (Oral)   Wt 242 lb 8 oz (110 kg)   SpO2 100%   BMI 40.35 kg/m   Physical Exam Vitals and nursing note reviewed.  Constitutional:      General: She is not in acute distress.    Appearance: Normal appearance. She is obese. She is ill-appearing. She is not toxic-appearing or diaphoretic.  HENT:     Head: Normocephalic and atraumatic.     Right Ear: Tympanic membrane, ear canal and external ear normal.     Left Ear: Tympanic membrane, ear canal and external ear normal.     Nose: Nose normal.     Mouth/Throat:     Mouth: Mucous membranes are moist.     Pharynx: Oropharynx is clear.  Eyes:     Extraocular Movements: Extraocular movements intact.     Conjunctiva/sclera: Conjunctivae normal.  Pupils: Pupils are equal, round, and reactive to light.  Cardiovascular:     Rate and Rhythm: Normal rate and regular rhythm.     Pulses: Normal pulses.     Heart sounds: Normal heart sounds. No murmur heard.    No friction rub. No gallop.  Pulmonary:     Effort: Pulmonary effort is normal. No respiratory distress.     Breath sounds: No stridor. Examination of the right-upper field reveals rhonchi. Examination of the left-upper field reveals rhonchi. Rhonchi present. No wheezing or rales.     Comments: Pt endorses wheezing; no wheezing heard  Chest:     Chest wall: No tenderness.  Musculoskeletal:        General: No swelling, tenderness, deformity or signs of injury. Normal range of motion.     Cervical back: Normal range of  motion and neck supple.     Right lower leg: No edema.     Left lower leg: No edema.  Skin:    General: Skin is warm and dry.     Capillary Refill: Capillary refill takes less than 2 seconds.     Coloration: Skin is not jaundiced or pale.     Findings: No bruising, erythema, lesion or rash.  Neurological:     General: No focal deficit present.     Mental Status: She is alert and oriented to person, place, and time. Mental status is at baseline.     Cranial Nerves: No cranial nerve deficit.     Sensory: No sensory deficit.     Motor: No weakness.     Coordination: Coordination normal.  Psychiatric:        Mood and Affect: Mood normal.        Behavior: Behavior normal.        Thought Content: Thought content normal.        Judgment: Judgment normal.     Results for orders placed or performed in visit on 11/19/22  POCT Influenza A/B  Result Value Ref Range   Influenza A, POC Negative Negative   Influenza B, POC Negative Negative    Assessment & Plan     Problem List Items Addressed This Visit       Respiratory   Viral URI with cough - Primary    Acute, negative COVID at work; negative flu in office Continue supportive measures; likely another viral cause given work in [facility] setting and symptoms described Use of albuterol with spacer, tessalon, and pred burst to assist RTC as needed      Relevant Medications   predniSONE (DELTASONE) 50 MG tablet   albuterol (VENTOLIN HFA) 108 (90 Base) MCG/ACT inhaler   Spacer/Aero-Holding Chambers DEVI   benzonatate (TESSALON) 200 MG capsule   Other Relevant Orders   POCT Influenza A/B (Completed)   Other Visit Diagnoses     Breast screening       Relevant Orders   MM 3D SCREEN BREAST BILATERAL      Return if symptoms worsen or fail to improve.     Vonna Kotyk, FNP, have reviewed all documentation for this visit. The documentation on 11/19/22 for the exam, diagnosis, procedures, and orders are all accurate and  complete.  Gwyneth Sprout, [place] [phone] (phone) [phone] (fax)  [place]

## 2022-12-04 ENCOUNTER — Other Ambulatory Visit: Payer: Self-pay | Admitting: Family Medicine

## 2022-12-04 DIAGNOSIS — G4700 Insomnia, unspecified: Secondary | ICD-10-CM

## 2022-12-04 DIAGNOSIS — E0843 Diabetes mellitus due to underlying condition with diabetic autonomic (poly)neuropathy: Secondary | ICD-10-CM

## 2022-12-04 NOTE — Telephone Encounter (Signed)
Requested Prescriptions  Pending Prescriptions Disp Refills   gabapentin (NEURONTIN) 600 MG tablet [Pharmacy Med Name: Gabapentin 600 MG Oral Tablet] 180 tablet 0    Sig: TAKE 2 TABLETS BY MOUTH AT BEDTIME     Neurology: Anticonvulsants - gabapentin Passed - 12/04/2022  9:33 AM      Passed - Cr in normal range and within 360 days    Creatinine  Date Value Ref Range Status  09/23/2012 0.92 0.60 - 1.30 mg/dL Final   Creatinine, Ser  Date Value Ref Range Status  01/24/2022 0.91 0.57 - 1.00 mg/dL Final         Passed - Completed PHQ-2 or PHQ-9 in the last 360 days      Passed - Valid encounter within last 12 months    Recent Outpatient Visits           2 weeks ago Viral URI with cough   [facility] Gwyneth Sprout, FNP   10 months ago Encounter for annual health examination   [facility] Tally Joe T, FNP   12 months ago Type 2 diabetes mellitus with other specified complication, without long-term current use of insulin [facility])   [facility] Tally Joe T, FNP   1 year ago Type 2 diabetes mellitus with other specified complication, without long-term current use of insulin [facility])   [facility], Dionne Bucy, MD   1 year ago Annual physical exam   [facility] [place], [place]       Future Appointments             In 2 months Gwyneth Sprout, [place], [facility]             traZODone ([place]) 100 MG tablet [Pharmacy Med Name: traZODone HCl 100 MG Oral Tablet] 90 tablet 0    Sig: TAKE 1 TABLET BY MOUTH AT BEDTIME     Psychiatry: Antidepressants - Serotonin Modulator Passed - 12/04/2022  9:33 AM      Passed - Valid encounter within last 6 months    Recent Outpatient Visits           2 weeks ago Viral URI with cough   [facility] Gwyneth Sprout, FNP   10 months ago Encounter for annual health examination   [facility] Tally Joe T, FNP    12 months ago Type 2 diabetes mellitus with other specified complication, without long-term current use of insulin [facility])   [facility] Tally Joe T, FNP   1 year ago Type 2 diabetes mellitus with other specified complication, without long-term current use of insulin [facility])   [facility] [place], Dionne Bucy, MD   1 year ago Annual physical exam   [facility] [place], [place]       Future Appointments             In 2 months Gwyneth Sprout, [place], [place]

## 2022-12-04 NOTE — Telephone Encounter (Signed)
Unable to refill per protocol, Rx request was refilled 12/04/22 for 90. Will refuse.  Requested Prescriptions  Pending Prescriptions Disp Refills   gabapentin (NEURONTIN) 600 MG tablet [Pharmacy Med Name: Gabapentin 600 MG Oral Tablet] 180 tablet 0    Sig: TAKE 2 TABLETS BY MOUTH AT BEDTIME     Neurology: Anticonvulsants - gabapentin Passed - 12/04/2022  9:33 AM      Passed - Cr in normal range and within 360 days    Creatinine  Date Value Ref Range Status  09/23/2012 0.92 0.60 - 1.30 mg/dL Final   Creatinine, Ser  Date Value Ref Range Status  01/24/2022 0.91 0.57 - 1.00 mg/dL Final         Passed - Completed PHQ-2 or PHQ-9 in the last 360 days      Passed - Valid encounter within last 12 months    Recent Outpatient Visits           2 weeks ago Viral URI with cough   [facility] Gwyneth Sprout, FNP   10 months ago Encounter for annual health examination   [facility] Tally Joe T, FNP   12 months ago Type 2 diabetes mellitus with other specified complication, without long-term current use of insulin [facility])   [facility] [place] Tally Joe T, FNP   1 year ago Type 2 diabetes mellitus with other specified complication, without long-term current use of insulin [facility])   [facility] [place], Dionne Bucy, MD   1 year ago Annual physical exam   [facility] [place], [place]       Future Appointments             In 2 months Gwyneth Sprout, [place], [place]

## 2023-01-09 ENCOUNTER — Telehealth: Payer: Self-pay | Admitting: Family Medicine

## 2023-01-09 NOTE — Telephone Encounter (Signed)
[place] faxed refill request for the following medications:   metFORMIN (GLUCOPHAGE) 1000 MG tablet    Please advise.

## 2023-01-10 ENCOUNTER — Other Ambulatory Visit: Payer: Self-pay

## 2023-01-10 DIAGNOSIS — E119 Type 2 diabetes mellitus without complications: Secondary | ICD-10-CM

## 2023-01-10 MED ORDER — METFORMIN HCL 1000 MG PO TABS: 1000.0000 mg | ORAL_TABLET | Freq: Two times a day (BID) | ORAL | 3 refills | Status: DC

## 2023-01-10 NOTE — Telephone Encounter (Signed)
Refilled

## 2023-01-17 ENCOUNTER — Ambulatory Visit
Admission: RE | Admit: 2023-01-17 | Discharge: 2023-01-17 | Disposition: A | Discharge status: Home / Self Care | Payer: BC Managed Care – PPO | Source: Ambulatory Visit | Attending: Family Medicine | Admitting: Family Medicine

## 2023-01-17 DIAGNOSIS — Z1231 Encounter for screening mammogram for malignant neoplasm of breast: Secondary | ICD-10-CM | POA: Diagnosis not present

## 2023-01-17 DIAGNOSIS — Z1239 Encounter for other screening for malignant neoplasm of breast: Secondary | ICD-10-CM

## 2023-01-21 ENCOUNTER — Telehealth: Payer: Self-pay

## 2023-01-21 NOTE — Progress Notes (Signed)
Hi Julia Robertson  Normal mammogram; repeat in 1 year.  Please let us know if you have any questions.  Thank you,  Tally Joe, FNP

## 2023-01-21 NOTE — Telephone Encounter (Signed)
Pat called for mammogram results. Shared provider's note. Gwyneth Sprout, FNP 01/21/2023 10:58 AM EST     Hi Zanasia   Normal mammogram; repeat in 1 year.   Please let us know if you have any questions.   Thank you,   Tally Joe, FNP

## 2023-02-17 NOTE — Progress Notes (Unsigned)
I,J'ya E Smith Potenza,acting as a scribe for Gwyneth Sprout, FNP.,have documented all relevant documentation on the behalf of Gwyneth Sprout, FNP,as directed by  Gwyneth Sprout, FNP while in the presence of Gwyneth Sprout, FNP.  Complete physical exam  Patient: Julia Robertson   DOB: Sep 07, 1956   67 y.o. Female  MRN: [id number] Visit Date: 02/18/2023  Today's healthcare provider: Gwyneth Sprout, FNP  Re Introduced to nurse practitioner role and practice setting.  All questions answered.  Discussed provider/patient relationship and expectations.  Chief Complaint  Patient presents with   Annual Exam   Subjective    Julia Robertson is a 67 y.o. female who presents today for a complete physical exam.  She reports consuming a general diet. Home exercise routine includes walking 0.5 hrs per day. She generally feels well. She reports sleeping well. She does have additional problems to discuss today.   Patient wants to discuss weight loss options.  Reports that she has had eye exam and mammogram. Patient understood that she needs foot exam. Patient wants to have TDaP and Shringrix.   HPI    Past Medical History:  Diagnosis Date   Diabetes mellitus without complication ([place])    Hyperlipemia    Hypertension    Neuromuscular disorder ([place])    Obesity    Past Surgical History:  Procedure Laterality Date   CATARACT EXTRACTION W/PHACO Left 10/09/2022   Procedure: CATARACT EXTRACTION PHACO AND INTRAOCULAR LENS PLACEMENT ([place]) LEFT DIABETIC  2.09  00:22.4;  Surgeon: Leandrew Koyanagi, MD;  Location: [facility];  Service: Ophthalmology;  Laterality: Left;  Diabetic   CHOLECYSTECTOMY     COLONOSCOPY WITH PROPOFOL N/A 11/28/2015   Procedure: COLONOSCOPY WITH PROPOFOL;  Surgeon: Lucilla Lame, MD;  Location: [facility] ENDOSCOPY;  Service: Endoscopy;  Laterality: N/A;   COLONOSCOPY WITH PROPOFOL N/A 02/27/2021   Procedure: COLONOSCOPY WITH PROPOFOL;  Surgeon: Lucilla Lame, MD;  Location: [facility]  ENDOSCOPY;  Service: Endoscopy;  Laterality: N/A;   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, [place]  Tobacco Use   Smoking status: Never   Smokeless tobacco: Never  Vaping Use   Vaping Use: Never used  Substance and Sexual Activity   Alcohol use: No   Drug use: No   Sexual activity: Not on file  Other Topics Concern   Not on file  Social History Narrative   Not on file   Social Determinants of Health   Financial Resource Strain: Not on file  Food Insecurity: Not on file  Transportation Needs: Not on file  Physical Activity: Not on file  Stress: Not on file  Social Connections: Not on file  Intimate Partner Violence: Not on file   Family Status  Relation Name Status   Mother  Deceased   Father  Deceased   Sister  Alive   Brother  Deceased   Brother  Deceased   Brother  Alive   Brother  Alive   Brother  Alive   Neg Hx  (Not Specified)   Family History  Problem Relation Age of Onset   Cancer Mother  pancreatic   Cancer Father        prostate   Hypertension Sister    Hypertension Brother    Kidney disease Brother    Hypertension Brother    Kidney disease Brother    Hypertension Brother    Diabetes Brother    Hypertension Brother    Breast cancer Neg Hx    Allergies  Allergen Reactions   Mucinex [Guaifenesin Er] Other (See Comments)    Syncope (dropped BP)    Patient Care Team: Gwyneth Sprout, FNP as PCP - General (Family Medicine) Cathi Roan, [facility] (Inactive) (Pharmacist)   Medications: Outpatient Medications Prior to Visit  Medication Sig   ibuprofen (ADVIL) 600 MG tablet Take 600 mg by mouth every 6 (six) hours as needed.    Multiple Vitamin (MULTIVITAMIN) capsule Take 1 capsule by mouth daily.   [DISCONTINUED] albuterol (VENTOLIN HFA) 108 (90 Base) MCG/ACT inhaler Inhale 2 puffs into the lungs every 6 (six) hours as needed for wheezing or shortness of breath.   [DISCONTINUED] amLODipine (NORVASC) 10 MG tablet Take 1 tablet (10 mg total) by mouth daily.   [DISCONTINUED] benzonatate (TESSALON) 200 MG capsule Take 1 capsule (200 mg total) by mouth 2 (two) times daily as needed for cough.   [DISCONTINUED] diclofenac Sodium (VOLTAREN) 1 % GEL Apply 4 g topically 4 (four) times daily.   [DISCONTINUED] gabapentin (NEURONTIN) 600 MG tablet TAKE 2 TABLETS BY MOUTH AT BEDTIME   [DISCONTINUED] hydrochlorothiazide (HYDRODIURIL) 50 MG tablet Take 1 tablet (50 mg total) by mouth daily.   [DISCONTINUED] lisinopril (ZESTRIL) 20 MG tablet Take 2 tablets (40 mg total) by mouth daily.   [DISCONTINUED] metFORMIN (GLUCOPHAGE) 1000 MG tablet Take 1 tablet (1,000 mg total) by mouth 2 (two) times daily with a meal.   [DISCONTINUED] traZODone (DESYREL) 100 MG tablet TAKE 1 TABLET BY MOUTH AT BEDTIME   [DISCONTINUED] amoxicillin (AMOXIL) 500 MG capsule Take 500 mg by mouth every 6 (six) hours. (Patient not taking: Reported on 02/18/2023)   [DISCONTINUED] predniSONE (DELTASONE) 50 MG tablet Take 1 tablet (50 mg total) by mouth daily with breakfast. (Patient not taking: Reported on 02/18/2023)   [DISCONTINUED] rosuvastatin (CRESTOR) 5 MG tablet Take 1 tablet (5 mg total) by mouth daily. (Patient not taking: Reported on 02/18/2023)   [DISCONTINUED] Semaglutide, 1 MG/DOSE, 4 MG/3ML SOPN Inject 1 mg as directed once a week. (Patient not taking: Reported on 02/18/2023)   [DISCONTINUED] Spacer/Aero-Holding Josiah Lobo DEVI 1 each by Does not apply route as needed. (Patient not taking: Reported on 02/18/2023)   No facility-administered medications prior to visit.    Review of Systems   Objective    BP (!) 132/96 (BP Location: Left Arm, Patient Position:  Sitting, Cuff Size: Large)   Pulse 94   Temp 97.9 F (36.6 C) (Oral)   Resp 13   Ht 5\' 4"  (1.626 m)   Wt 253 lb (114.8 kg)   SpO2 98%   BMI 43.43 kg/m   Physical Exam Vitals and nursing note reviewed.  Constitutional:      General: She is awake. She is not in acute distress.    Appearance: Normal appearance. She is well-developed and well-groomed. She is obese. She is not ill-appearing, toxic-appearing or diaphoretic.  HENT:     Head: Normocephalic and atraumatic.     Jaw: There is normal jaw occlusion. No trismus, tenderness, swelling or pain on movement.     Right Ear: Hearing, tympanic membrane, ear canal and external ear normal. There is  no impacted cerumen.     Left Ear: Hearing, tympanic membrane, ear canal and external ear normal. There is no impacted cerumen.     Nose: Nose normal. No congestion or rhinorrhea.     Right Turbinates: Not enlarged, swollen or pale.     Left Turbinates: Not enlarged, swollen or pale.     Right Sinus: No maxillary sinus tenderness or frontal sinus tenderness.     Left Sinus: No maxillary sinus tenderness or frontal sinus tenderness.     Mouth/Throat:     Lips: Pink.     Mouth: Mucous membranes are moist. No injury.     Tongue: No lesions.     Pharynx: Oropharynx is clear. Uvula midline. No pharyngeal swelling, oropharyngeal exudate, posterior oropharyngeal erythema or uvula swelling.     Tonsils: No tonsillar exudate or tonsillar abscesses.  Eyes:     General: Lids are normal. Lids are everted, no foreign bodies appreciated. Vision grossly intact. Gaze aligned appropriately. No allergic shiner or visual field deficit.       Right eye: No discharge.        Left eye: No discharge.     Extraocular Movements: Extraocular movements intact.     Conjunctiva/sclera: Conjunctivae normal.     Right eye: Right conjunctiva is not injected. No exudate.    Left eye: Left conjunctiva is not injected. No exudate.    Pupils: Pupils are equal, round, and  reactive to light.  Neck:     Thyroid: No thyroid mass, thyromegaly or thyroid tenderness.     Vascular: No carotid bruit.     Trachea: Trachea normal.  Cardiovascular:     Rate and Rhythm: Normal rate and regular rhythm.     Pulses: Normal pulses.          Carotid pulses are 2+ on the right side and 2+ on the left side.      Radial pulses are 2+ on the right side and 2+ on the left side.       Dorsalis pedis pulses are 2+ on the right side and 2+ on the left side.       Posterior tibial pulses are 2+ on the right side and 2+ on the left side.     Heart sounds: Normal heart sounds, S1 normal and S2 normal. No murmur heard.    No friction rub. No gallop.  Pulmonary:     Effort: Pulmonary effort is normal. No respiratory distress.     Breath sounds: Normal breath sounds and air entry. No stridor. No wheezing, rhonchi or rales.  Chest:     Chest wall: No tenderness.     Comments: Breasts: risk and benefit of breast self-exam was discussed, not examined  Abdominal:     General: Abdomen is flat. Bowel sounds are normal. There is no distension.     Palpations: Abdomen is soft. There is no mass.     Tenderness: There is no abdominal tenderness. There is no right CVA tenderness, left CVA tenderness, guarding or rebound.     Hernia: No hernia is present.  Genitourinary:    Comments: Exam deferred; denies complaints Musculoskeletal:        General: No swelling, tenderness, deformity or signs of injury. Normal range of motion.     Cervical back: Full passive range of motion without pain, normal range of motion and neck supple. No edema, rigidity or tenderness. No muscular tenderness.     Right lower leg: No edema.  Left lower leg: No edema.  Lymphadenopathy:     Cervical: No cervical adenopathy.     Right cervical: No superficial, deep or posterior cervical adenopathy.    Left cervical: No superficial, deep or posterior cervical adenopathy.  Skin:    General: Skin is warm and dry.      Capillary Refill: Capillary refill takes less than 2 seconds.     Coloration: Skin is not jaundiced or pale.     Findings: No bruising, erythema, lesion or rash.  Neurological:     General: No focal deficit present.     Mental Status: She is alert and oriented to person, place, and time. Mental status is at baseline.     GCS: GCS eye subscore is 4. GCS verbal subscore is 5. GCS motor subscore is 6.     Sensory: Sensation is intact. No sensory deficit.     Motor: Motor function is intact. No weakness.     Coordination: Coordination is intact. Coordination normal.     Gait: Gait is intact. Gait normal.  Psychiatric:        Attention and Perception: Attention and perception normal.        Mood and Affect: Mood and affect normal.        Speech: Speech normal.        Behavior: Behavior normal. Behavior is cooperative.        Thought Content: Thought content normal.        Cognition and Memory: Cognition and memory normal.        Judgment: Judgment normal.     Last depression screening scores    11/19/2022    3:06 PM 12/05/2021    3:07 PM 07/10/2021    8:45 AM  PHQ 2/9 Scores  PHQ - 2 Score 0 0 0  PHQ- 9 Score 6  0   Last fall risk screening    11/19/2022    3:06 PM  [place] in the past year? 0  Number falls in past yr: 0  Injury with Fall? 0   Last Audit-C alcohol use screening    01/24/2022    8:51 AM  Alcohol Use Disorder Test (AUDIT)  1. How often do you have a drink containing alcohol? 0  2. How many drinks containing alcohol do you have on a typical day when you are drinking? 0  3. How often do you have six or more drinks on one occasion? 0  AUDIT-C Score 0   A score of 3 or more in women, and 4 or more in men indicates increased risk for alcohol abuse, EXCEPT if all of the points are from question 1   No results found for any visits on 02/18/23.  Assessment & Plan    Routine Health Maintenance and Physical Exam  Exercise Activities and Dietary  recommendations  Goals   None     Immunization History  Administered Date(s) Administered   DTaP 11/25/2008   Influenza Split 08/25/2009   Influenza,inj,Quad PF,6+ Mos 09/30/2018, 12/28/2020   MODERNA COVID-19 SARS-COV-2 PEDS BIVALENT BOOSTER 6Y-11Y 10/11/2020, 01/08/2022   Moderna Sars-Covid-2 Vaccination 11/30/2019, 12/28/2019   Pneumococcal Polysaccharide-23 11/26/1999   Td 11/25/1993   Tdap 02/18/2023   Zoster Recombinat (Shingrix) 01/24/2022, 02/18/2023    Health Maintenance  Topic Date Due   Diabetic kidney evaluation - Urine ACR  01/21/2018   COVID-19 Vaccine (5 - 2023-24 season) 07/26/2022   HEMOGLOBIN A1C  07/27/2022   OPHTHALMOLOGY EXAM  10/15/2022  Diabetic kidney evaluation - eGFR measurement  01/25/2023   INFLUENZA VACCINE  02/23/2023 (Originally 06/25/2022)   PAP SMEAR-Modifier  10/01/2023   FOOT EXAM  02/18/2024   MAMMOGRAM  01/17/2025   COLONOSCOPY (Pts 45-65yrs Insurance coverage will need to be confirmed)  02/28/2028   DTaP/Tdap/Td (4 - Td or Tdap) 02/17/2033   Hepatitis C Screening  Completed   HIV Screening  Completed   Zoster Vaccines- Shingrix  Completed   HPV VACCINES  Aged Out    Discussed health benefits of physical activity, and encouraged her to engage in regular exercise appropriate for her age and condition.  Problem List Items Addressed This Visit       Cardiovascular and Mediastinum   Hypertension associated with diabetes ([place])    Chronic, remains elevated Recommend change from lisinopril 40 mg, hctz 25 mg to diovan  Continue home BP checks to reach goal of <130/<80 and improved exercise and activity tolerance      Relevant Medications   valsartan-hydrochlorothiazide (DIOVAN-HCT) 320-25 MG tablet   amLODipine (NORVASC) 10 MG tablet   tirzepatide (MOUNJARO) 5 MG/0.5ML Pen   rosuvastatin (CRESTOR) 20 MG tablet   metFORMIN (GLUCOPHAGE-XR) 500 MG 24 hr tablet   Other Relevant Orders   CBC with Differential/Platelet   Comprehensive  Metabolic Panel (CMET)   Urine Microalbumin w/creat. 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 ratio   Lipid panel   Hyperlipidemia associated with type 2 diabetes mellitus (HCC)    Chronic, previously elevated Repeat LP Patient notes not previously starting statin to assist at higher dose; continue to discuss risk vs benefit of ASCVD with known HTN, HLD and DM LDL goal of 55-70      Relevant Medications   valsartan-hydrochlorothiazide (DIOVAN-HCT) 320-25 MG tablet   amLODipine (NORVASC) 10 MG tablet   tirzepatide (MOUNJARO) 5 MG/0.5ML Pen   rosuvastatin (CRESTOR) 20 MG tablet   metFORMIN (GLUCOPHAGE-XR) 500 MG 24 hr tablet   Other Relevant Orders   Urine Microalbumin w/creat. ratio   Lipid panel   RESOLVED: T2DM (type 2 diabetes mellitus) (HCC)   Relevant Medications   valsartan-hydrochlorothiazide (DIOVAN-HCT) 320-25 MG tablet   tirzepatide (MOUNJARO) 5 MG/0.5ML Pen   rosuvastatin (CRESTOR) 20 MG tablet   metFORMIN (GLUCOPHAGE-XR) 500 MG 24 hr tablet   Other Relevant Orders   Urine Microalbumin w/creat. ratio     Other   Encounter for annual health examination - Primary    UTD on dental and vision; may have some teeth pulled on top UTD on mammo and colon; PAP due later this year if patient chooses to perform Things to do to keep yourself  healthy  - Exercise at least 30-45 minutes a day, 3-4 days a week.  - Eat a low-fat diet with lots of fruits and vegetables, up to 7-9 servings per day.  - Seatbelts  can save your life. Wear them always.  - Smoke detectors on every level of your home, check batteries every year.  - Eye Doctor - have an eye exam every 1-2 years  - Safe sex - if you may be exposed to STDs, use a condom.  - Alcohol -  If you drink, do it moderately, less than 2 drinks per day.  - [place]. Choose someone to speak for you if you are not able.  - Depression is common in our stressful world.If you're feeling down or losing interest in things you normally enjoy, please come in for a visit.  - Violence - If anyone is threatening or hurting you, please call immediately.       Relevant Orders   CBC with Differential/Platelet   Comprehensive Metabolic Panel (CMET)   Hemoglobin A1c   Lipid panel   TSH + free T4   Insomnia    Chronic, stable Wishes to continue previously dose of trazodone and gabapentin given RLS      Relevant Medications   traZODone (DESYREL) 100 MG tablet   Morbid obesity (HCC)    Chronic, stable Body mass index is 43.43 kg/m. Discussed importance of healthy weight management Discussed diet and exercise       Relevant Medications   tirzepatide (MOUNJARO) 5 MG/0.5ML Pen   metFORMIN (GLUCOPHAGE-XR) 500 MG 24 hr tablet   Other Relevant Orders   CBC with Differential/Platelet   Comprehensive Metabolic Panel (CMET)   Hemoglobin A1c   Lipid panel   TSH + free T4   Need for shingles vaccine   Relevant Orders   Zoster Recombinant (Shingrix ) (Completed)   Need for Tdap vaccination   Relevant Orders   Tdap vaccine greater than or equal to 7yo IM (Completed)   Return in about 3 months (around 05/21/2023) for chonic disease management, T2DM management, HTN management.    Vonna Kotyk, FNP, have reviewed all documentation for this visit. The documentation on  02/18/23 for the exam, diagnosis, procedures, and orders are all accurate and complete.  Gwyneth Sprout, [place] [phone] (phone) [phone] (fax)  [place]

## 2023-02-18 ENCOUNTER — Ambulatory Visit (INDEPENDENT_AMBULATORY_CARE_PROVIDER_SITE_OTHER): Payer: BC Managed Care – PPO | Admitting: Family Medicine

## 2023-02-18 ENCOUNTER — Encounter: Payer: Self-pay | Admitting: Family Medicine

## 2023-02-18 VITALS — BP 132/96 | HR 94 | Temp 97.9°F | Resp 13 | Ht 64.0 in | Wt 253.0 lb

## 2023-02-18 DIAGNOSIS — E0843 Diabetes mellitus due to underlying condition with diabetic autonomic (poly)neuropathy: Secondary | ICD-10-CM | POA: Insufficient documentation

## 2023-02-18 DIAGNOSIS — E1169 Type 2 diabetes mellitus with other specified complication: Secondary | ICD-10-CM

## 2023-02-18 DIAGNOSIS — E785 Hyperlipidemia, unspecified: Secondary | ICD-10-CM

## 2023-02-18 DIAGNOSIS — E1159 Type 2 diabetes mellitus with other circulatory complications: Secondary | ICD-10-CM | POA: Diagnosis not present

## 2023-02-18 DIAGNOSIS — Z23 Encounter for immunization: Secondary | ICD-10-CM | POA: Diagnosis not present

## 2023-02-18 DIAGNOSIS — G4700 Insomnia, unspecified: Secondary | ICD-10-CM | POA: Diagnosis not present

## 2023-02-18 DIAGNOSIS — I152 Hypertension secondary to endocrine disorders: Secondary | ICD-10-CM | POA: Diagnosis not present

## 2023-02-18 DIAGNOSIS — Z0000 Encounter for general adult medical examination without abnormal findings: Secondary | ICD-10-CM

## 2023-02-18 DIAGNOSIS — E119 Type 2 diabetes mellitus without complications: Secondary | ICD-10-CM | POA: Diagnosis not present

## 2023-02-18 MED ORDER — VALSARTAN-HYDROCHLOROTHIAZIDE 320-25 MG PO TABS: 1.0000 | ORAL_TABLET | Freq: Every day | ORAL | 3 refills | Status: DC

## 2023-02-18 MED ORDER — ROSUVASTATIN CALCIUM 20 MG PO TABS: 20.0000 mg | ORAL_TABLET | Freq: Every day | ORAL | 3 refills | Status: DC

## 2023-02-18 MED ORDER — TRAZODONE HCL 100 MG PO TABS: 100.0000 mg | ORAL_TABLET | Freq: Every day | ORAL | 3 refills | Status: DC

## 2023-02-18 MED ORDER — METFORMIN HCL ER 500 MG PO TB24: 1000.0000 mg | ORAL_TABLET | Freq: Two times a day (BID) | ORAL | 3 refills | Status: DC

## 2023-02-18 MED ORDER — TIRZEPATIDE 5 MG/0.5ML ~~LOC~~ SOAJ: 5.0000 mg | SUBCUTANEOUS | 3 refills | Status: DC

## 2023-02-18 MED ORDER — GABAPENTIN 600 MG PO TABS: 1200.0000 mg | ORAL_TABLET | Freq: Every day | ORAL | 3 refills | Status: DC

## 2023-02-18 MED ORDER — AMLODIPINE BESYLATE 10 MG PO TABS: ORAL_TABLET | ORAL | 0 refills | Status: DC

## 2023-02-18 NOTE — Assessment & Plan Note (Signed)
Chronic, previously elevated Repeat LP Patient notes not previously starting statin to assist at higher dose; continue to discuss risk vs benefit of ASCVD with known HTN, HLD and DM LDL goal of 55-70

## 2023-02-18 NOTE — Assessment & Plan Note (Signed)
RLS present Repeat A1c and urine micro Change Metformin from IR to ER given complaints of loose bowels Will change from ozempic to mounjaro to assist with 3 month f/u

## 2023-02-18 NOTE — Assessment & Plan Note (Signed)
UTD on dental and vision; may have some teeth pulled on top UTD on mammo and colon; PAP due later this year if patient chooses to perform Things to do to keep yourself healthy  - Exercise at least 30-45 minutes a day, 3-4 days a week.  - Eat a low-fat diet with lots of fruits and vegetables, up to 7-9 servings per day.  - Seatbelts can save your life. Wear them always.  - Smoke detectors on every level of your home, check batteries every year.  - Eye Doctor - have an eye exam every 1-2 years  - Safe sex - if you may be exposed to STDs, use a condom.  - Alcohol -  If you drink, do it moderately, less than 2 drinks per day.  - [place]. Choose someone to speak for you if you are not able.  - Depression is common in our stressful world.If you're feeling down or losing interest in things you normally enjoy, please come in for a visit.  - Violence - If anyone is threatening or hurting you, please call immediately.

## 2023-02-18 NOTE — Assessment & Plan Note (Signed)
Chronic, remains elevated Recommend change from lisinopril 40 mg, hctz 25 mg to diovan  Continue home BP checks to reach goal of <130/<80 and improved exercise and activity tolerance

## 2023-02-18 NOTE — Assessment & Plan Note (Signed)
Chronic, stable Body mass index is 43.43 kg/m. Discussed importance of healthy weight management Discussed diet and exercise

## 2023-02-18 NOTE — Assessment & Plan Note (Signed)
Chronic, stable Wishes to continue previously dose of trazodone and gabapentin given RLS

## 2023-02-19 LAB — COMPREHENSIVE METABOLIC PANEL
ALT: 28 IU/L (ref 0–32)
AST: 30 IU/L (ref 0–40)
Albumin/Globulin Ratio: 1.1 — ABNORMAL LOW (ref 1.2–2.2)
Albumin: 3.9 g/dL (ref 3.9–4.9)
Alkaline Phosphatase: 73 IU/L (ref 44–121)
BUN/Creatinine Ratio: 10 — ABNORMAL LOW (ref 12–28)
BUN: 10 mg/dL (ref 8–27)
Bilirubin Total: 0.2 mg/dL (ref 0.0–1.2)
CO2: 25 mmol/L (ref 20–29)
Calcium: 9.3 mg/dL (ref 8.7–10.3)
Chloride: 100 mmol/L (ref 96–106)
Creatinine, Ser: 0.96 mg/dL (ref 0.57–1.00)
Globulin, Total: 3.6 g/dL (ref 1.5–4.5)
Glucose: 195 mg/dL — ABNORMAL HIGH (ref 70–99)
Potassium: 4.7 mmol/L (ref 3.5–5.2)
Sodium: 139 mmol/L (ref 134–144)
Total Protein: 7.5 g/dL (ref 6.0–8.5)
eGFR: 66 mL/min/{1.73_m2} (ref >59)

## 2023-02-19 LAB — CBC WITH DIFFERENTIAL/PLATELET
Basophils Absolute: 0 10*3/uL (ref 0.0–0.2)
Basos: 1 %
EOS (ABSOLUTE): 0.1 10*3/uL (ref 0.0–0.4)
Eos: 2 %
Hematocrit: 39 % (ref 34.0–46.6)
Hemoglobin: 12.4 g/dL (ref 11.1–15.9)
Immature Grans (Abs): 0 10*3/uL (ref 0.0–0.1)
Immature Granulocytes: 0 %
Lymphocytes Absolute: 2.4 10*3/uL (ref 0.7–3.1)
Lymphs: 48 %
MCH: 27.6 pg (ref 26.6–33.0)
MCHC: 31.8 g/dL (ref 31.5–35.7)
MCV: 87 fL (ref 79–97)
Monocytes Absolute: 0.5 10*3/uL (ref 0.1–0.9)
Monocytes: 9 %
Neutrophils Absolute: 2 10*3/uL (ref 1.4–7.0)
Neutrophils: 40 %
Platelets: 239 10*3/uL (ref 150–450)
RBC: 4.5 x10E6/uL (ref 3.77–5.28)
RDW: 12.2 % (ref 11.7–15.4)
WBC: 5.1 10*3/uL (ref 3.4–10.8)

## 2023-02-19 LAB — LIPID PANEL
Chol/HDL Ratio: 5.4 ratio — ABNORMAL HIGH (ref 0.0–4.4)
Cholesterol, Total: 234 mg/dL — ABNORMAL HIGH (ref 100–199)
HDL: 43 mg/dL (ref >39)
LDL Chol Calc (NIH): 144 mg/dL — ABNORMAL HIGH (ref 0–99)
Triglycerides: 260 mg/dL — ABNORMAL HIGH (ref 0–149)
VLDL Cholesterol Cal: 47 mg/dL — ABNORMAL HIGH (ref 5–40)

## 2023-02-19 LAB — MICROALBUMIN / CREATININE URINE RATIO
Creatinine, Urine: 143.3 mg/dL
Microalb/Creat Ratio: 6 mg/g creat (ref 0–29)
Microalbumin, Urine: 8.2 ug/mL

## 2023-02-19 LAB — TSH+FREE T4
Free T4: 1.16 ng/dL (ref 0.82–1.77)
TSH: 0.77 u[IU]/mL (ref 0.450–4.500)

## 2023-02-19 LAB — HEMOGLOBIN A1C
Est. average glucose Bld gHb Est-mCnc: 186 mg/dL
Hgb A1c MFr Bld: 8.1 % — ABNORMAL HIGH (ref 4.8–5.6)

## 2023-02-19 NOTE — Progress Notes (Signed)
Normal urine micro; repeat in 1 year.

## 2023-02-19 NOTE — Progress Notes (Signed)
It was nice to see you yesterday; -blood chemistry notes increase in glucose levels; otherwise, stable -A1c has increased; now uncontrolled at 8.1%. Continue new dose of metformin xr to reduce GI side effects and re-start GLP injection to assist. Continue to recommend balanced, lower carb meals. Smaller meal size, adding snacks. Choosing water as drink of choice and increasing purposeful exercise. -Cholesterol is increased and over goal of LDL of 55-70 as discussed. Please start higher dose of statin medication to assist  -urine is pending.  Gwyneth Sprout, [place] [employer] [address] [place], [place] [address] [phone] (phone) [phone] (fax) [place]

## 2023-04-16 ENCOUNTER — Telehealth: Payer: Self-pay | Admitting: Family Medicine

## 2023-04-16 NOTE — Telephone Encounter (Signed)
Pt called requesting to resume taking Ozempic, says she has taken this before but did not answer when asked if she has discussed this with her PCP already

## 2023-04-16 NOTE — Telephone Encounter (Signed)
LVMTCB. CRM created. Ok for [facility] to advise per Merita Norton and clarify if patient is okay with trying mounjaro

## 2023-05-21 ENCOUNTER — Ambulatory Visit: Payer: BC Managed Care – PPO | Admitting: Family Medicine

## 2023-05-27 ENCOUNTER — Ambulatory Visit: Payer: BC Managed Care – PPO | Admitting: Family Medicine

## 2023-05-27 ENCOUNTER — Encounter: Payer: Self-pay | Admitting: Family Medicine

## 2023-05-27 VITALS — BP 139/68 | HR 78 | Temp 98.3°F | Resp 12 | Ht 64.0 in | Wt 246.9 lb

## 2023-05-27 DIAGNOSIS — I152 Hypertension secondary to endocrine disorders: Secondary | ICD-10-CM

## 2023-05-27 DIAGNOSIS — E1159 Type 2 diabetes mellitus with other circulatory complications: Secondary | ICD-10-CM

## 2023-05-27 DIAGNOSIS — E119 Type 2 diabetes mellitus without complications: Secondary | ICD-10-CM | POA: Insufficient documentation

## 2023-05-27 DIAGNOSIS — E1340 Other specified diabetes mellitus with diabetic neuropathy, unspecified: Secondary | ICD-10-CM | POA: Diagnosis not present

## 2023-05-27 DIAGNOSIS — E0843 Diabetes mellitus due to underlying condition with diabetic autonomic (poly)neuropathy: Secondary | ICD-10-CM

## 2023-05-27 LAB — POCT GLYCOSYLATED HEMOGLOBIN (HGB A1C): HbA1c POC (<> result, manual entry): 8.1 % (ref 4.0–5.6)

## 2023-05-27 MED ORDER — TIRZEPATIDE 2.5 MG/0.5ML ~~LOC~~ SOAJ: 2.5000 mg | SUBCUTANEOUS | 0 refills | Status: DC

## 2023-05-27 MED ORDER — GABAPENTIN 800 MG PO TABS: 1600.0000 mg | ORAL_TABLET | Freq: Every day | ORAL | 0 refills | Status: DC

## 2023-05-27 NOTE — Assessment & Plan Note (Signed)
Chronic, stable Repeat A1c unchanged Was not able to fill mounjaro Will resend today Continue to recommend balanced, lower carb meals. Smaller meal size, adding snacks. Choosing water as drink of choice and increasing purposeful exercise. Remains on metformin to assist

## 2023-05-27 NOTE — Assessment & Plan Note (Signed)
Borderline today Reviewed goal of 129/79 to assist with further development of chronic conditions Continues on norvasc 10 mg and diovan 320-25 Continue to recommend salt limited diet and routine exercise

## 2023-05-27 NOTE — Progress Notes (Signed)
Established patient visit  Patient: Julia Robertson   DOB: [birth year]   67 y.o. Female  MRN: [id number] Visit Date: 05/27/2023  Today's healthcare provider: Jacky Kindle, FNP  Re Introduced to nurse practitioner role and practice setting.  All questions answered.  Discussed provider/patient relationship and expectations.  Chief Complaint  Patient presents with   Medical Management of Chronic Issues   Subjective    HPI HPI   Patient reports fbs range from 140-180. Patient reports she never got mounjaro. Home BP readings are mostly normal, she reports some high readings. She denies any chest pain, sob, or edema Last edited by Myles Lipps, CMA on 05/27/2023  2:55 PM.       Medications: Outpatient Medications Prior to Visit  Medication Sig   amLODipine (NORVASC) 10 MG tablet Take 0.5 tablets (5 mg total) by mouth daily for 14 days, THEN 1 tablet (10 mg total) daily.   ibuprofen (ADVIL) 600 MG tablet Take 600 mg by mouth every 6 (six) hours as needed.   metFORMIN (GLUCOPHAGE-XR) 500 MG 24 hr tablet Take 2 tablets (1,000 mg total) by mouth 2 (two) times daily with a meal.   Multiple Vitamin (MULTIVITAMIN) capsule Take 1 capsule by mouth daily.   rosuvastatin (CRESTOR) 20 MG tablet Take 1 tablet (20 mg total) by mouth daily.   traZODone (DESYREL) 100 MG tablet Take 1 tablet (100 mg total) by mouth at bedtime.   valsartan-hydrochlorothiazide (DIOVAN-HCT) 320-25 MG tablet Take 1 tablet by mouth daily.   [DISCONTINUED] gabapentin (NEURONTIN) 600 MG tablet Take 2 tablets (1,200 mg total) by mouth at bedtime.   [DISCONTINUED] tirzepatide [facility]) 5 MG/0.5ML Pen Inject 5 mg into the skin once a week. (Patient not taking: Reported on 05/27/2023)   No facility-administered medications prior to visit.    Review of Systems    Objective    BP 139/68 (BP Location: Left Arm, Patient Position: Sitting, Cuff Size: Large)   Pulse 78   Temp 98.3 F (36.8 C) (Temporal)   Resp  12   Ht 5\' 4"  (1.626 m)   Wt 246 lb 14.4 oz (112 kg)   SpO2 98%   BMI 42.38 kg/m   Physical Exam Vitals and nursing note reviewed.  Constitutional:      General: She is not in acute distress.    Appearance: Normal appearance. She is obese. She is not ill-appearing, toxic-appearing or diaphoretic.  HENT:     Head: Normocephalic and atraumatic.  Cardiovascular:     Rate and Rhythm: Normal rate and regular rhythm.     Pulses: Normal pulses.     Heart sounds: Normal heart sounds. No murmur heard.    No friction rub. No gallop.  Pulmonary:     Effort: Pulmonary effort is normal. No respiratory distress.     Breath sounds: Normal breath sounds. No stridor. No wheezing, rhonchi or rales.  Chest:     Chest wall: No tenderness.  Musculoskeletal:        General: No swelling, tenderness, deformity or signs of injury. Normal range of motion.     Right lower leg: No edema.     Left lower leg: No edema.  Skin:    General: Skin is warm and dry.     Capillary Refill: Capillary refill takes less than 2 seconds.     Coloration: Skin is not jaundiced or pale.     Findings: No bruising, erythema, lesion or rash.  Neurological:  General: No focal deficit present.     Mental Status: She is alert and oriented to person, place, and time. Mental status is at baseline.     Cranial Nerves: No cranial nerve deficit.     Sensory: No sensory deficit.     Motor: No weakness.     Coordination: Coordination normal.  Psychiatric:        Mood and Affect: Mood normal.        Behavior: Behavior normal.        Thought Content: Thought content normal.        Judgment: Judgment normal.      No results found for any visits on 05/27/23.  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 Smaller meal size, adding snacks. Choosing water as drink of choice and increasing purposeful exercise. Remains on metformin to assist       Relevant Medications   tirzepatide (MOUNJARO) 2.5 MG/0.5ML Pen   gabapentin (NEURONTIN) 800 MG tablet   Other Relevant Orders   POCT glycosylated hemoglobin (Hb A1C)   Return in about 3 months (around 08/27/2023) for chonic disease management.     Leilani Merl, FNP, have reviewed all documentation for this visit. The documentation on 05/27/23 for the exam, diagnosis, procedures, and orders are all accurate and complete.  Jacky Kindle, FNP  [facility] [phone] (phone) [phone] (fax)  [facility]

## 2023-05-28 ENCOUNTER — Telehealth: Payer: Self-pay | Admitting: Family Medicine

## 2023-05-28 NOTE — Telephone Encounter (Signed)
Covermymeds is requesting prior authorization Key: [id number] Name: Edythe Clarity 2.5mg /0.5ML pen injectors

## 2023-06-05 ENCOUNTER — Telehealth: Payer: Self-pay

## 2023-06-05 NOTE — Telephone Encounter (Signed)
Copied from CRM [id number]. Topic: General - Other >> Jun 05, 2023  1:37 PM Dondra Prader E wrote: Reason for CRM: Pt called reporting that she contacted the pharmacy to refill diabetic medication (that she does not know the name of) its a weekly injection. She says that the pharmacy told her that her doctor's office needs to contact her insurance. To make sure this is something that will be covered.   Best contact: [phone]

## 2023-06-06 DIAGNOSIS — E1340 Other specified diabetes mellitus with diabetic neuropathy, unspecified: Secondary | ICD-10-CM | POA: Insufficient documentation

## 2023-06-12 NOTE — Telephone Encounter (Signed)
Left detailed message for patient to fu with pharmacy for prescription.

## 2023-06-12 NOTE — Telephone Encounter (Signed)
[place] [id number]. PA started today.

## 2023-06-12 NOTE — Telephone Encounter (Signed)
Ron Agee (Key: Iona Coach) PA Case ID #: [id number] Rx #: [id number] Outcome Approved today Request Reference Number: [id number]. MOUNJARO INJ 2.5/0.5 is approved through 06/11/2024. Your patient may now fill this prescription and it will be covered. Authorization Expiration Date: 06/11/2024

## 2023-06-29 ENCOUNTER — Other Ambulatory Visit: Payer: Self-pay | Admitting: Family Medicine

## 2023-06-29 DIAGNOSIS — E1340 Other specified diabetes mellitus with diabetic neuropathy, unspecified: Secondary | ICD-10-CM

## 2023-06-30 NOTE — Telephone Encounter (Signed)
Requested Prescriptions  Pending Prescriptions Disp Refills   gabapentin (NEURONTIN) 800 MG tablet [Pharmacy Med Name: Gabapentin 800 MG Oral Tablet] 180 tablet 0    Sig: TAKE 2 TABLETS BY MOUTH AT BEDTIME     Neurology: Anticonvulsants - gabapentin Passed - 06/29/2023  6:15 PM      Passed - Cr in normal range and within 360 days    Creatinine  Date Value Ref Range Status  09/23/2012 0.92 0.60 - 1.30 mg/dL Final   Creatinine, Ser  Date Value Ref Range Status  02/18/2023 0.96 0.57 - 1.00 mg/dL Final         Passed - Completed PHQ-2 or PHQ-9 in the last 360 days      Passed - Valid encounter within last 12 months    Recent Outpatient Visits           1 month ago Hypertension associated with diabetes [facility])   [place] [facility] Jacky Kindle, FNP   4 months ago Encounter for annual health examination   [facility] Merita Norton T, FNP   7 months ago Viral URI with cough   [facility] Jacky Kindle, FNP   1 year ago Encounter for annual health examination   [facility] Merita Norton T, FNP   1 year ago Type 2 diabetes mellitus with other specified complication, without long-term current use of insulin [facility])   [place] [facility] Jacky Kindle, FNP       Future Appointments             In 1 month Suzie Portela, Daryl Eastern, FNP [facility]

## 2023-08-28 ENCOUNTER — Encounter: Payer: Self-pay | Admitting: Family Medicine

## 2023-08-28 ENCOUNTER — Ambulatory Visit: Payer: BC Managed Care – PPO | Admitting: Family Medicine

## 2023-08-28 VITALS — BP 135/72 | HR 78 | Ht 64.0 in | Wt 250.0 lb

## 2023-08-28 DIAGNOSIS — I152 Hypertension secondary to endocrine disorders: Secondary | ICD-10-CM | POA: Diagnosis not present

## 2023-08-28 DIAGNOSIS — E1169 Type 2 diabetes mellitus with other specified complication: Secondary | ICD-10-CM | POA: Diagnosis not present

## 2023-08-28 DIAGNOSIS — E1159 Type 2 diabetes mellitus with other circulatory complications: Secondary | ICD-10-CM

## 2023-08-28 DIAGNOSIS — E0843 Diabetes mellitus due to underlying condition with diabetic autonomic (poly)neuropathy: Secondary | ICD-10-CM

## 2023-08-28 DIAGNOSIS — E1340 Other specified diabetes mellitus with diabetic neuropathy, unspecified: Secondary | ICD-10-CM

## 2023-08-28 DIAGNOSIS — E785 Hyperlipidemia, unspecified: Secondary | ICD-10-CM

## 2023-08-28 DIAGNOSIS — Z7984 Long term (current) use of oral hypoglycemic drugs: Secondary | ICD-10-CM

## 2023-08-28 MED ORDER — METFORMIN HCL ER 500 MG PO TB24
1000.0000 mg | ORAL_TABLET | Freq: Two times a day (BID) | ORAL | 3 refills | Status: DC
Start: 2023-08-28 — End: 2023-12-03

## 2023-08-28 MED ORDER — AMLODIPINE BESYLATE 10 MG PO TABS
10.0000 mg | ORAL_TABLET | Freq: Every day | ORAL | 3 refills | Status: DC
Start: 2023-08-28 — End: 2023-12-03

## 2023-08-28 MED ORDER — GABAPENTIN 800 MG PO TABS
1600.0000 mg | ORAL_TABLET | Freq: Every day | ORAL | 3 refills | Status: DC
Start: 2023-08-28 — End: 2023-12-03

## 2023-08-28 MED ORDER — CYCLOBENZAPRINE HCL 10 MG PO TABS: 10.0000 mg | ORAL_TABLET | Freq: Every day | ORAL | 2 refills | Status: DC

## 2023-08-28 MED ORDER — VALSARTAN-HYDROCHLOROTHIAZIDE 320-25 MG PO TABS
1.0000 | ORAL_TABLET | Freq: Every day | ORAL | 3 refills | Status: DC
Start: 2023-08-28 — End: 2023-12-03

## 2023-08-28 MED ORDER — TIRZEPATIDE 5 MG/0.5ML ~~LOC~~ SOAJ: 5.0000 mg | SUBCUTANEOUS | 0 refills | Status: DC

## 2023-08-28 MED ORDER — ROSUVASTATIN CALCIUM 20 MG PO TABS
20.0000 mg | ORAL_TABLET | Freq: Every day | ORAL | 3 refills | Status: DC
Start: 2023-08-28 — End: 2023-08-29

## 2023-08-28 NOTE — Progress Notes (Signed)
Established patient visit   Patient: Julia Robertson   DOB: 1956-10-12   67 y.o. Female  MRN: [id number] Visit Date: 08/28/2023  Today's healthcare provider: Jacky Kindle, FNP  Introduced to nurse practitioner role and practice setting.  All questions answered.  Discussed provider/patient relationship and expectations.  Subjective    HPI HPI     Follow-up    Additional comments: Both legs have been cramping at night       Last edited by Clois Comber on 08/28/2023  4:30 PM.     The patient, with a history of diabetes, presents with right leg cramps and left groin pain. They admit to not drinking enough water recently. The groin pain is described as intermittent and stabbing, but does not radiate or cause numbness or tingling in the leg. The pain is exacerbated by walking. They deny any changes in bowel or bladder habits.  The patient's diabetes has been poorly controlled with a persistently elevated A1c. They have been adherent to their injectable medication, but are considering increasing the dose. They have not yet completed a diabetic eye exam. Last completed on 09/2021.  The patient also mentions a craving for strawberry shortcake, but has not indulged.Congratulated on her restraint!   They work in transportation, but previously worked as a Lawyer, which they believe was better for their health however, the change was made to assist with aging concerns. They have been taking time off work to assist their sister, who is undergoing treatment for breast cancer.  Medications: Outpatient Medications Prior to Visit  Medication Sig   ibuprofen (ADVIL) 600 MG tablet Take 600 mg by mouth every 6 (six) hours as needed.   Multiple Vitamin (MULTIVITAMIN) capsule Take 1 capsule by mouth daily.   traZODone (DESYREL) 100 MG tablet Take 1 tablet (100 mg total) by mouth at bedtime.   [DISCONTINUED] gabapentin (NEURONTIN) 800 MG tablet TAKE 2 TABLETS BY MOUTH AT BEDTIME    [DISCONTINUED] metFORMIN (GLUCOPHAGE-XR) 500 MG 24 hr tablet Take 2 tablets (1,000 mg total) by mouth 2 (two) times daily with a meal.   [DISCONTINUED] rosuvastatin (CRESTOR) 20 MG tablet Take 1 tablet (20 mg total) by mouth daily.   [DISCONTINUED] tirzepatide [facility]) 2.5 MG/0.5ML Pen Inject 2.5 mg into the skin once a week.   [DISCONTINUED] valsartan-hydrochlorothiazide (DIOVAN-HCT) 320-25 MG tablet Take 1 tablet by mouth daily.   [DISCONTINUED] amLODipine (NORVASC) 10 MG tablet Take 0.5 tablets (5 mg total) by mouth daily for 14 days, THEN 1 tablet (10 mg total) daily.   No facility-administered medications prior to visit.    Review of Systems  Last CBC Lab Results  Component Value Date   WBC 5.1 02/18/2023   HGB 12.4 02/18/2023   HCT 39.0 02/18/2023   MCV 87 02/18/2023   MCH 27.6 02/18/2023   RDW 12.2 02/18/2023   PLT 239 02/18/2023   Last metabolic panel Lab Results  Component Value Date   GLUCOSE 195 (H) 02/18/2023   NA 139 02/18/2023   K 4.7 02/18/2023   CL 100 02/18/2023   CO2 25 02/18/2023   BUN 10 02/18/2023   CREATININE 0.96 02/18/2023   EGFR 66 02/18/2023   CALCIUM 9.3 02/18/2023   PROT 7.5 02/18/2023   ALBUMIN 3.9 02/18/2023   LABGLOB 3.6 02/18/2023   AGRATIO 1.1 (L) 02/18/2023   BILITOT 0.2 02/18/2023   ALKPHOS 73 02/18/2023   AST 30 02/18/2023   ALT 28 02/18/2023   ANIONGAP 9 09/23/2012  Last lipids Lab Results  Component Value Date   CHOL 234 (H) 02/18/2023   HDL 43 02/18/2023   LDLCALC 144 (H) 02/18/2023   TRIG 260 (H) 02/18/2023   CHOLHDL 5.4 (H) 02/18/2023   Last hemoglobin A1c Lab Results  Component Value Date   HGBA1C 8.1 05/27/2023     Objective    BP 135/72 (BP Location: Left Arm, Patient Position: Sitting, Cuff Size: Large)   Pulse 78   Ht 5\' 4"  (1.626 m)   Wt 250 lb (113.4 kg)   SpO2 100%   BMI 42.91 kg/m  BP Readings from Last 3 Encounters:  08/28/23 135/72  05/27/23 139/68  02/18/23 (!) 132/96   Wt Readings from  Last 3 Encounters:  08/28/23 250 lb (113.4 kg)  05/27/23 246 lb 14.4 oz (112 kg)  02/18/23 253 lb (114.8 kg)   Physical Exam Vitals and nursing note reviewed.  Constitutional:      General: She is not in acute distress.    Appearance: Normal appearance. She is obese. She is not ill-appearing, toxic-appearing or diaphoretic.  HENT:     Head: Normocephalic and atraumatic.  Cardiovascular:     Rate and Rhythm: Normal rate and regular rhythm.     Pulses: Normal pulses.     Heart sounds: Normal heart sounds. No murmur heard.    No friction rub. No gallop.  Pulmonary:     Effort: Pulmonary effort is normal. No respiratory distress.     Breath sounds: Normal breath sounds. No stridor. No wheezing, rhonchi or rales.  Chest:     Chest wall: No tenderness.  Abdominal:     Tenderness: There is no abdominal tenderness. There is no right CVA tenderness, left CVA tenderness or guarding.  Musculoskeletal:        General: Tenderness present. No swelling, deformity or signs of injury. Normal range of motion.     Right lower leg: No edema.     Left lower leg: No edema.       Legs:  Skin:    General: Skin is warm and dry.     Capillary Refill: Capillary refill takes less than 2 seconds.     Coloration: Skin is not jaundiced or pale.     Findings: No bruising, erythema, lesion or rash.  Neurological:     General: No focal deficit present.     Mental Status: She is alert and oriented to person, place, and time. Mental status is at baseline.     Cranial Nerves: No cranial nerve deficit.     Sensory: No sensory deficit.     Motor: No weakness.     Coordination: Coordination normal.  Psychiatric:        Mood and Affect: Mood normal.        Behavior: Behavior normal.        Thought Content: Thought content normal.        Judgment: Judgment normal.     No results found for any visits on 08/28/23.  Assessment & Plan     Problem List Items Addressed This Visit       Cardiovascular and  Mediastinum   Hypertension associated with diabetes (HCC) - Primary   Relevant Medications   tirzepatide (MOUNJARO) 5 MG/0.5ML Pen   amLODipine (NORVASC) 10 MG tablet   metFORMIN (GLUCOPHAGE-XR) 500 MG 24 hr tablet   rosuvastatin (CRESTOR) 20 MG tablet   valsartan-hydrochlorothiazide (DIOVAN-HCT) 320-25 MG tablet   Other Relevant Orders   CBC with Differential/Platelet  Comprehensive Metabolic Panel (CMET)   TSH   Lipid panel   Hemoglobin A1c   Urine Microalbumin w/creat. 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 ratio   Hyperlipidemia associated with type 2 diabetes mellitus (HCC)   Relevant Medications   tirzepatide (MOUNJARO) 5 MG/0.5ML Pen   amLODipine (NORVASC) 10 MG tablet   metFORMIN (GLUCOPHAGE-XR) 500 MG 24 hr tablet   rosuvastatin (CRESTOR) 20 MG tablet   valsartan-hydrochlorothiazide (DIOVAN-HCT) 320-25 MG tablet   Other Relevant Orders   CBC with Differential/Platelet   Comprehensive Metabolic Panel (CMET)   TSH   Lipid panel   Hemoglobin A1c   Urine Microalbumin w/creat. ratio   Neuropathy due to secondary diabetes (HCC)   Relevant Medications   tirzepatide (MOUNJARO) 5 MG/0.5ML Pen   gabapentin (NEURONTIN) 800 MG tablet   metFORMIN (GLUCOPHAGE-XR) 500 MG 24 hr tablet   rosuvastatin (CRESTOR) 20 MG tablet   valsartan-hydrochlorothiazide (DIOVAN-HCT) 320-25 MG tablet   Other Relevant Orders   CBC with Differential/Platelet   Comprehensive Metabolic Panel (CMET)   TSH   Lipid panel   Hemoglobin A1c   Urine  Microalbumin w/creat. ratio     Other   Morbid obesity (HCC)   Relevant Medications   tirzepatide (MOUNJARO) 5 MG/0.5ML Pen   metFORMIN (GLUCOPHAGE-XR) 500 MG 24 hr tablet   Other Relevant Orders   CBC with Differential/Platelet   Comprehensive Metabolic Panel (CMET)   TSH   Lipid panel   Hemoglobin A1c   Urine Microalbumin w/creat. ratio   Other Visit Diagnoses     Type 2 diabetes mellitus with other specified complication, without long-term current use of insulin (HCC)       Relevant Medications   tirzepatide (MOUNJARO) 5 MG/0.5ML Pen   metFORMIN (GLUCOPHAGE-XR) 500 MG 24 hr tablet   rosuvastatin (CRESTOR) 20 MG tablet   valsartan-hydrochlorothiazide (DIOVAN-HCT) 320-25 MG tablet   Other Relevant Orders   CBC with Differential/Platelet   Comprehensive Metabolic Panel (CMET)   TSH   Lipid panel   Hemoglobin A1c   Urine Microalbumin w/creat. ratio     Left Groin Pain Intermittent stabbing pain in the left groin, exacerbated by walking. No associated numbness, tingling, or swelling. No changes in bowel or bladder habits. Possible muscle strain in the psoas muscle. No signs of hernia or vascular insufficiency on examination. -Recommend stretching, hydration, and over-the-counter ibuprofen as needed. -Prescribe low-dose Flexeril for nighttime use to aid sleep and muscle relaxation.  Right Leg Cramps Patient reports cramps in the right leg. No associated swelling or discoloration. Possible dehydration. -Advise increased water intake. -Add magnesium to labs on return 10/4 to assess for deficiency.  Type 2 Diabetes Mellitus Persistent elevated A1c (8.1). Last labs in February showed glucose of 195. Patient has not yet completed a diabetic eye exam. -Continue current antidiabetic medication regimen. -Order labs to assess current glucose and A1c levels. -Advise patient to schedule a diabetic eye exam. -increase mounjaro from 2.5 to 5 mg to assist.  Hyperlipidemia Elevated  cholesterol and triglycerides noted on last labs in February. -  Continue current lipid-lowering medication regimen. -Order labs to assess current lipid levels.  General Health Maintenance -Flu vaccine already administered at work. -Advise patient to schedule a diabetic eye exam.  Return in about 3 months (around 11/28/2023) for chonic disease management.     Leilani Merl, FNP, have reviewed all documentation for this visit. The documentation on 08/28/23 for the exam, diagnosis, procedures, and orders are all accurate and complete.  Jacky Kindle, FNP  [facility] [phone] (phone) [phone] (fax)  [facility]

## 2023-08-29 ENCOUNTER — Other Ambulatory Visit: Payer: Self-pay | Admitting: Family Medicine

## 2023-08-29 LAB — COMPREHENSIVE METABOLIC PANEL
ALT: 30 [IU]/L (ref 0–32)
AST: 32 [IU]/L (ref 0–40)
Albumin: 4.1 g/dL (ref 3.9–4.9)
Alkaline Phosphatase: 77 [IU]/L (ref 44–121)
BUN/Creatinine Ratio: 17 (ref 12–28)
BUN: 13 mg/dL (ref 8–27)
Bilirubin Total: 0.3 mg/dL (ref 0.0–1.2)
CO2: 28 mmol/L (ref 20–29)
Calcium: 9.8 mg/dL (ref 8.7–10.3)
Chloride: 95 mmol/L — ABNORMAL LOW (ref 96–106)
Creatinine, Ser: 0.78 mg/dL (ref 0.57–1.00)
Globulin, Total: 3.6 g/dL (ref 1.5–4.5)
Glucose: 185 mg/dL — ABNORMAL HIGH (ref 70–99)
Potassium: 4.2 mmol/L (ref 3.5–5.2)
Sodium: 140 mmol/L (ref 134–144)
Total Protein: 7.7 g/dL (ref 6.0–8.5)
eGFR: 85 mL/min/{1.73_m2} (ref >59)

## 2023-08-29 LAB — CBC WITH DIFFERENTIAL/PLATELET
Basophils Absolute: 0.1 10*3/uL (ref 0.0–0.2)
Basos: 1 %
EOS (ABSOLUTE): 0.1 10*3/uL (ref 0.0–0.4)
Eos: 2 %
Hematocrit: 40.6 % (ref 34.0–46.6)
Hemoglobin: 12.7 g/dL (ref 11.1–15.9)
Immature Grans (Abs): 0 10*3/uL (ref 0.0–0.1)
Immature Granulocytes: 0 %
Lymphocytes Absolute: 3.5 10*3/uL — ABNORMAL HIGH (ref 0.7–3.1)
Lymphs: 49 %
MCH: 27.7 pg (ref 26.6–33.0)
MCHC: 31.3 g/dL — ABNORMAL LOW (ref 31.5–35.7)
MCV: 89 fL (ref 79–97)
Monocytes Absolute: 0.7 10*3/uL (ref 0.1–0.9)
Monocytes: 9 %
Neutrophils Absolute: 2.7 10*3/uL (ref 1.4–7.0)
Neutrophils: 39 %
Platelets: 249 10*3/uL (ref 150–450)
RBC: 4.59 x10E6/uL (ref 3.77–5.28)
RDW: 12 % (ref 11.7–15.4)
WBC: 7 10*3/uL (ref 3.4–10.8)

## 2023-08-29 LAB — LIPID PANEL
Chol/HDL Ratio: 4.4 {ratio} (ref 0.0–4.4)
Cholesterol, Total: 219 mg/dL — ABNORMAL HIGH (ref 100–199)
HDL: 50 mg/dL (ref >39)
LDL Chol Calc (NIH): 131 mg/dL — ABNORMAL HIGH (ref 0–99)
Triglycerides: 214 mg/dL — ABNORMAL HIGH (ref 0–149)
VLDL Cholesterol Cal: 38 mg/dL (ref 5–40)

## 2023-08-29 LAB — HEMOGLOBIN A1C
Est. average glucose Bld gHb Est-mCnc: 194 mg/dL
Hgb A1c MFr Bld: 8.4 % — ABNORMAL HIGH (ref 4.8–5.6)

## 2023-08-29 LAB — TSH: TSH: 0.822 u[IU]/mL (ref 0.450–4.500)

## 2023-08-29 LAB — MICROALBUMIN / CREATININE URINE RATIO
Creatinine, Urine: 100.7 mg/dL
Microalb/Creat Ratio: 21 mg/g{creat} (ref 0–29)
Microalbumin, Urine: 21.3 ug/mL

## 2023-08-29 MED ORDER — ROSUVASTATIN CALCIUM 40 MG PO TABS: 40.0000 mg | ORAL_TABLET | Freq: Every day | ORAL | 3 refills | Status: DC

## 2023-08-29 MED ORDER — EZETIMIBE 10 MG PO TABS: 10.0000 mg | ORAL_TABLET | Freq: Every day | ORAL | 3 refills | Status: DC

## 2023-08-29 NOTE — Progress Notes (Signed)
The 10-year ASCVD risk score (Arnett DK, et al., 2019) is: 24.3% Cholesterol remains elevated in total, fats and bad/LDL. Risk of heart attack/stroke is 24%. I continue to recommend diet low in saturated fat and regular exercise - 30 min at least 5 times per week. Recommend addition of zetia to previous prescribed 20 mg of crestor- if you have not picked this medication up, we can change to 40 mg to best lower risk of stroke or heart event.  A1c has increased; now at 8.4%. Continue to recommend balanced, lower carb meals. Smaller meal size, adding snacks. Choosing water as drink of choice and increasing purposeful exercise. Given we increased the mounjaro from 2.5-5 mg weekly we can continue diet and exercise to reassess in 3 months. Continue 1000 mg metformin twice daily  Urine pending.

## 2023-10-06 ENCOUNTER — Other Ambulatory Visit: Payer: Self-pay | Admitting: Family Medicine

## 2023-10-06 DIAGNOSIS — E1340 Other specified diabetes mellitus with diabetic neuropathy, unspecified: Secondary | ICD-10-CM

## 2023-10-09 ENCOUNTER — Other Ambulatory Visit: Payer: Self-pay | Admitting: Family Medicine

## 2023-10-09 DIAGNOSIS — E1340 Other specified diabetes mellitus with diabetic neuropathy, unspecified: Secondary | ICD-10-CM

## 2023-10-09 NOTE — Telephone Encounter (Signed)
Medication Refill -  Most Recent Primary Care Visit:  Provider: Merita Norton T  Department: [facility]  Visit Type: OFFICE VISIT  Date: 08/28/2023  Medication: gabapentin (NEURONTIN) 800 MG tablet [[id number]]   Has the patient contacted their pharmacy? Yes    Is this the correct pharmacy for this prescription? Yes If no, delete pharmacy and type the correct one.  This is the patient's preferred pharmacy:  [facility] [address]), [place] - [address] [place]) [place] [address] Phone: [phone] Fax: [phone]   Has the prescription been filled recently? Yes  Is the patient out of the medication? Yes  Has the patient been seen for an appointment in the last year OR does the patient have an upcoming appointment? Yes  Can we respond through MyChart? Yes  Agent: Please be advised that Rx refills may take up to 3 business days. We ask that you follow-up with your pharmacy.

## 2023-10-10 NOTE — Telephone Encounter (Signed)
Too early. Refilled 08/28/23. Requested Prescriptions  Refused Prescriptions Disp Refills   gabapentin (NEURONTIN) 800 MG tablet 180 tablet 3    Sig: Take 2 tablets (1,600 mg total) by mouth at bedtime.     Neurology: Anticonvulsants - gabapentin Passed - 10/09/2023  4:15 PM      Passed - Cr in normal range and within 360 days    Creatinine  Date Value Ref Range Status  09/23/2012 0.92 0.60 - 1.30 mg/dL Final   Creatinine, Ser  Date Value Ref Range Status  08/28/2023 0.78 0.57 - 1.00 mg/dL Final         Passed - Completed PHQ-2 or PHQ-9 in the last 360 days      Passed - Valid encounter within last 12 months    Recent Outpatient Visits           1 month ago Hypertension associated with diabetes [facility])   [place] [facility] Merita Norton T, FNP   4 months ago Hypertension associated with diabetes [facility])   [place] [facility] Jacky Kindle, FNP   7 months ago Encounter for annual health examination   [facility] Merita Norton T, FNP   10 months ago Viral URI with cough   [facility] Jacky Kindle, FNP   1 year ago Encounter for annual health examination   [facility] Jacky Kindle, FNP       Future Appointments             In 1 month Suzie Portela, Daryl Eastern, FNP [facility]

## 2023-11-28 ENCOUNTER — Ambulatory Visit: Payer: BC Managed Care – PPO | Admitting: Family Medicine

## 2023-12-03 ENCOUNTER — Ambulatory Visit (INDEPENDENT_AMBULATORY_CARE_PROVIDER_SITE_OTHER): Payer: Medicare Other | Admitting: Family Medicine

## 2023-12-03 ENCOUNTER — Encounter: Payer: Self-pay | Admitting: Family Medicine

## 2023-12-03 VITALS — BP 126/82 | HR 78 | Ht 65.0 in | Wt 248.0 lb

## 2023-12-03 DIAGNOSIS — E1169 Type 2 diabetes mellitus with other specified complication: Secondary | ICD-10-CM

## 2023-12-03 DIAGNOSIS — E119 Type 2 diabetes mellitus without complications: Secondary | ICD-10-CM | POA: Insufficient documentation

## 2023-12-03 DIAGNOSIS — Z23 Encounter for immunization: Secondary | ICD-10-CM | POA: Diagnosis not present

## 2023-12-03 DIAGNOSIS — I152 Hypertension secondary to endocrine disorders: Secondary | ICD-10-CM

## 2023-12-03 DIAGNOSIS — Z7984 Long term (current) use of oral hypoglycemic drugs: Secondary | ICD-10-CM | POA: Diagnosis not present

## 2023-12-03 DIAGNOSIS — E785 Hyperlipidemia, unspecified: Secondary | ICD-10-CM | POA: Diagnosis not present

## 2023-12-03 DIAGNOSIS — G4700 Insomnia, unspecified: Secondary | ICD-10-CM

## 2023-12-03 DIAGNOSIS — E1340 Other specified diabetes mellitus with diabetic neuropathy, unspecified: Secondary | ICD-10-CM

## 2023-12-03 DIAGNOSIS — E1159 Type 2 diabetes mellitus with other circulatory complications: Secondary | ICD-10-CM

## 2023-12-03 DIAGNOSIS — Z13820 Encounter for screening for osteoporosis: Secondary | ICD-10-CM

## 2023-12-03 MED ORDER — METFORMIN HCL ER 500 MG PO TB24: 500.0000 mg | ORAL_TABLET | Freq: Every day | ORAL | 0 refills | Status: DC

## 2023-12-03 MED ORDER — GABAPENTIN 800 MG PO TABS: 1600.0000 mg | ORAL_TABLET | Freq: Every day | ORAL | 1 refills | Status: DC

## 2023-12-03 MED ORDER — VALSARTAN-HYDROCHLOROTHIAZIDE 320-25 MG PO TABS: 1.0000 | ORAL_TABLET | Freq: Every day | ORAL | 1 refills | Status: DC

## 2023-12-03 MED ORDER — ROSUVASTATIN CALCIUM 40 MG PO TABS: 40.0000 mg | ORAL_TABLET | Freq: Every day | ORAL | 3 refills | Status: DC

## 2023-12-03 MED ORDER — AMLODIPINE BESYLATE 10 MG PO TABS: 10.0000 mg | ORAL_TABLET | Freq: Every day | ORAL | 1 refills | Status: DC

## 2023-12-03 MED ORDER — EZETIMIBE 10 MG PO TABS: 10.0000 mg | ORAL_TABLET | Freq: Every day | ORAL | 3 refills | Status: DC

## 2023-12-03 MED ORDER — TRULICITY 0.75 MG/0.5ML ~~LOC~~ SOAJ: 0.7500 mg | SUBCUTANEOUS | 2 refills | Status: DC

## 2023-12-03 MED ORDER — TRAZODONE HCL 100 MG PO TABS: 100.0000 mg | ORAL_TABLET | Freq: Every day | ORAL | 1 refills | Status: DC

## 2023-12-03 NOTE — Assessment & Plan Note (Signed)
 Chronically managed with Trazodone  100mg  nightly - wishes to continue, expressed benefits of general sleep hygiene versus medication. Will reorder and reassess need at next visit  - Limit screen time,  - Set nightly schedule - limit water intake past 9pm - bed is for sleep and intimacy only  - If awake go do something outside of bedroom

## 2023-12-03 NOTE — Assessment & Plan Note (Signed)
 Controlled, stable - today 126/82, subtle elevated DBP Goal SBP<130; DBP<80 - low sodium diet - exercise - weight loss - monitor at home weekly with upper arm automatic cuff Continues on norvasc 10 mg and diovan 320-25

## 2023-12-03 NOTE — Assessment & Plan Note (Signed)
 Chronic, gradually improved Body mass index is 41.27 kg/m down from 43 in march 2024 Down 2lbs since October - Joined [employer] recently - motivated.  Discussed importance of healthy weight management Discussed diet and exercise

## 2023-12-03 NOTE — Progress Notes (Signed)
 Established Patient Office Visit  Introduced to nurse practitioner role and practice setting.  All questions answered.  Discussed provider/patient relationship and expectations.   Subjective   Patient ID: Julia Robertson, female    DOB: November 27, 1955  Age: 68 y.o. MRN: [id number]  Chief Complaint  Patient presents with   Medical Management of Chronic Issues    3 month follow-up   Diabetes    Weight gain. Insurance will not cover mounjaro  and metformin  causing diarrhea   Hyperlipidemia   Hypertension    Pt presents for refills on her HTN medications, cholesterol meds, DMII meds, neuropathy meds, and insomnia medication.  Denies any headaches or vision changes.   Has Optho scheduled for annual diabetic eye exam,   Needs routine labs for chronic conditions.  Due for pneumococcal vaccine and DEXA scan screening for osteoporosis.         05/27/2023    2:56 PM 11/19/2022    3:06 PM 12/05/2021    3:07 PM  Depression screen PHQ 2/9  Decreased Interest 0 0 0  Down, Depressed, Hopeless 0 0 0  PHQ - 2 Score 0 0 0  Altered sleeping  0   Tired, decreased energy  3   Change in appetite  3   Feeling bad or failure about yourself   0   Trouble concentrating  0   Moving slowly or fidgety/restless  0   Suicidal thoughts  0   PHQ-9 Score  6   Difficult doing work/chores  Not difficult at all         No data to display           Review of Systems  All other systems reviewed and are negative.   Negative unless indicated in HPI   Objective:     BP 126/82 (BP Location: Left Arm, Patient Position: Sitting, Cuff Size: Normal)   Pulse 78   Ht 5' 5 (1.651 m)   Wt 248 lb (112.5 kg)   BMI 41.27 kg/m    Physical Exam Vitals reviewed.  Constitutional:      Appearance: Normal appearance. She is obese.  HENT:     Head: Normocephalic.     Nose: Nose normal. No congestion or rhinorrhea.  Eyes:     Extraocular Movements: Extraocular movements intact.      Conjunctiva/sclera: Conjunctivae normal.     Pupils: Pupils are equal, round, and reactive to light.  Cardiovascular:     Rate and Rhythm: Normal rate and regular rhythm.     Pulses: Normal pulses.     Heart sounds: Normal heart sounds. No murmur heard.    No gallop.  Pulmonary:     Effort: Pulmonary effort is normal. No respiratory distress.     Breath sounds: Normal breath sounds. No stridor. No wheezing, rhonchi or rales.  Chest:     Chest wall: No tenderness.  Musculoskeletal:     Right lower leg: No edema.     Left lower leg: No edema.  Skin:    General: Skin is warm and dry.     Capillary Refill: Capillary refill takes less than 2 seconds.  Neurological:     General: No focal deficit present.     Mental Status: She is alert and oriented to person, place, and time. Mental status is at baseline.     Cranial Nerves: No cranial nerve deficit.     Sensory: No sensory deficit.     Motor: No weakness.  Coordination: Coordination normal.     Gait: Gait normal.  Psychiatric:        Mood and Affect: Mood normal.        Behavior: Behavior normal.        Thought Content: Thought content normal.        Judgment: Judgment normal.    No results found for any visits on 12/03/23.    The 10-year ASCVD risk score (Arnett DK, et al., 2019) is: 21.8%    Assessment & Plan:  Hypertension associated with diabetes [facility]) Assessment & Plan: Controlled, stable - today 126/82, subtle elevated DBP Goal SBP<130; DBP<80 - low sodium diet - exercise - weight loss - monitor at home weekly with upper arm automatic cuff Continues on norvasc  10 mg and diovan  320-25    Orders: -     Comprehensive metabolic panel -     Hemoglobin A1c -     Valsartan -hydroCHLOROthiazide ; Take 1 tablet by mouth daily.  Dispense: 90 tablet; Refill: 1 -     amLODIPine  Besylate; Take 1 tablet (10 mg total) by mouth daily.  Dispense: 90 tablet; Refill: 1 -     metFORMIN  HCl ER; Take 1 tablet (500 mg total) by  mouth daily with breakfast.  Dispense: 360 tablet; Refill: 0  Hyperlipidemia associated with type 2 diabetes mellitus [facility]) Assessment & Plan: Chronic, previous elevated in October 2024, but improved but 01/2023 - Continue Daily Crestor  40 mg po daily - Continue Zetia  10 mg po daily - Recheck Lipid panel today - LDL goal <70 - Weight loss, DMII and HTN control stressed  Orders: -     Lipid panel -     Rosuvastatin  Calcium ; Take 1 tablet (40 mg total) by mouth daily.  Dispense: 90 tablet; Refill: 3 -     Ezetimibe ; Take 1 tablet (10 mg total) by mouth daily.  Dispense: 90 tablet; Refill: 3  Type 2 diabetes mellitus with other specified complication, without long-term current use of insulin  (HCC) Assessment & Plan: Chronically managed with metformin  500mg  BID, having increase GI upset. Will decrease to 500mg  daily.  - Added trulicity  0.75mg   weekly for DMII mgmt - A1C recheck today, previous was 8.4 on 08/28/23 - was unable to try mounjaro  bc insurance did not cover, if insurance still doesn't cover injectable will try oral second agent. - Referral to Nutrition for DM mgmt - small frequent meals with protein - check BG every morning - weight loss and exercise.   Orders: -     Comprehensive metabolic panel -     Hemoglobin A1c -     Trulicity ; Inject 0.75 mg into the skin once a week.  Dispense: 2 mL; Refill: 2 -     metFORMIN  HCl ER; Take 1 tablet (500 mg total) by mouth daily with breakfast.  Dispense: 360 tablet; Refill: 0 -     Referral to Nutrition and Diabetes Services  Insomnia, unspecified type Assessment & Plan: Chronically managed with Trazodone  100mg  nightly - wishes to continue, expressed benefits of general sleep hygiene versus medication. Will reorder and reassess need at next visit  - Limit screen time,  - Set nightly schedule - limit water intake past 9pm - bed is for sleep and intimacy only  - If awake go do something outside of bedroom  Orders: -     traZODone   HCl; Take 1 tablet (100 mg total) by mouth at bedtime.  Dispense: 90 tablet; Refill: 1  Neuropathy due to secondary diabetes [facility]) Assessment &  Plan: Nightly gabapentin  use, Chronic Reordered Gabapentin  1600mg  po nightly for neuropathy mgmt - DMI control stressed, goal A1c<7  Orders: -     Gabapentin ; Take 2 tablets (1,600 mg total) by mouth at bedtime.  Dispense: 180 tablet; Refill: 1  Morbid obesity (HCC) Assessment & Plan: Chronic, gradually improved Body mass index is 41.27 kg/m down from 43 in march 2024 Down 2lbs since October - Joined [employer] recently - motivated.  Discussed importance of healthy weight management Discussed diet and exercise    Orders: -     Comprehensive metabolic panel  Immunization due -     Pneumococcal conjugate vaccine 20-valent  Screening for osteoporosis -     DG Bone Density; Future    Will communicate lab results.   Return in about 3 months (around 03/02/2024) for a1c check.   I, Curtis DELENA Boom, FNP, have reviewed all documentation for this visit. The documentation on 12/03/23 for the exam, diagnosis, procedures, and orders are all accurate and complete.    Curtis DELENA Boom, FNP

## 2023-12-03 NOTE — Assessment & Plan Note (Signed)
 Chronically managed with metformin  500mg  BID, having increase GI upset. Will decrease to 500mg  daily.  - Added trulicity  0.75mg   weekly for DMII mgmt - A1C recheck today, previous was 8.4 on 08/28/23 - was unable to try mounjaro  bc insurance did not cover, if insurance still doesn't cover injectable will try oral second agent. - Referral to Nutrition for DM mgmt - small frequent meals with protein - check BG every morning - weight loss and exercise.

## 2023-12-03 NOTE — Assessment & Plan Note (Signed)
 Chronic, previous elevated in October 2024, but improved but 01/2023 - Continue Daily Crestor 40 mg po daily - Continue Zetia 10 mg po daily - Recheck Lipid panel today - LDL goal <70 - Weight loss, DMII and HTN control stressed

## 2023-12-03 NOTE — Assessment & Plan Note (Signed)
 Nightly gabapentin use, Chronic Reordered Gabapentin 1600mg  po nightly for neuropathy mgmt - DMI control stressed, goal A1c<7

## 2023-12-04 ENCOUNTER — Telehealth: Payer: Self-pay

## 2023-12-04 ENCOUNTER — Telehealth: Payer: Self-pay | Admitting: Family Medicine

## 2023-12-04 LAB — COMPREHENSIVE METABOLIC PANEL
ALT: 25 [IU]/L (ref 0–32)
AST: 24 [IU]/L (ref 0–40)
Albumin: 4 g/dL (ref 3.9–4.9)
Alkaline Phosphatase: 81 [IU]/L (ref 44–121)
BUN/Creatinine Ratio: 14 (ref 12–28)
BUN: 10 mg/dL (ref 8–27)
Bilirubin Total: 0.3 mg/dL (ref 0.0–1.2)
CO2: 26 mmol/L (ref 20–29)
Calcium: 9.1 mg/dL (ref 8.7–10.3)
Chloride: 98 mmol/L (ref 96–106)
Creatinine, Ser: 0.71 mg/dL (ref 0.57–1.00)
Globulin, Total: 3.7 g/dL (ref 1.5–4.5)
Glucose: 324 mg/dL — ABNORMAL HIGH (ref 70–99)
Potassium: 3.7 mmol/L (ref 3.5–5.2)
Sodium: 140 mmol/L (ref 134–144)
Total Protein: 7.7 g/dL (ref 6.0–8.5)
eGFR: 94 mL/min/{1.73_m2} (ref >59)

## 2023-12-04 LAB — LIPID PANEL
Chol/HDL Ratio: 3.6 {ratio} (ref 0.0–4.4)
Cholesterol, Total: 148 mg/dL (ref 100–199)
HDL: 41 mg/dL (ref >39)
LDL Chol Calc (NIH): 65 mg/dL (ref 0–99)
Triglycerides: 265 mg/dL — ABNORMAL HIGH (ref 0–149)
VLDL Cholesterol Cal: 42 mg/dL — ABNORMAL HIGH (ref 5–40)

## 2023-12-04 LAB — HEMOGLOBIN A1C
Est. average glucose Bld gHb Est-mCnc: 206 mg/dL
Hgb A1c MFr Bld: 8.8 % — ABNORMAL HIGH (ref 4.8–5.6)

## 2023-12-04 NOTE — Telephone Encounter (Signed)
 Covermymeds is requesting prior authorization Key: [id number] Trulicity 0.75MG /0.5ML auto injectros has been rejected and requires PA

## 2023-12-04 NOTE — Progress Notes (Signed)
 Lipid- improved cholesterol, elevated triglycerides - drawn non-fasting given pt's schedule and availability. Pt on Crestor  and Zetia  - continue med and diet changes.  CMP - normal kidney, liver function, electrolytes normal other than elevated glucose at 324 A1C - increased from previous drawn 8.4 to 8.8 - uncontrolled. Pt started on trulicity  0.75 pending insurance approval - if not will start on dual po agent. - recheck in three months. Referral made to nutrition for DM diet mgmt. Continue to increase exercise, small frequent meals - veggies, protein, decrease starches (breads, pastas, rise), daily morning BG checks.   The 10-year ASCVD risk score (Arnett DK, et al., 2019) is: 16.4%   Values used to calculate the score:     Age: 68 years     Sex: Female     Is Non-Hispanic African American: Yes     Diabetic: Yes     Tobacco smoker: No     Systolic Blood Pressure: 126 mmHg     Is BP treated: Yes     HDL Cholesterol: 41 mg/dL     Total Cholesterol: 148 mg/dL

## 2023-12-04 NOTE — Telephone Encounter (Signed)
 Shared provider's note. Follow up appt scheduled.  Julia DELENA Boom, FNP 12/04/2023  8:32 AM EST Back to Top    Lipid- improved cholesterol, elevated triglycerides - drawn non-fasting given pt's schedule and availability. Pt on Crestor  and Zetia  - continue med and diet changes. CMP - normal kidney, liver function, electrolytes normal other than elevated glucose at 324 A1C - increased from previous drawn 8.4 to 8.8 - uncontrolled. Pt started on trulicity  0.75 pending insurance approval - if not will start on dual po agent. - recheck in three months. Referral made to nutrition for DM diet mgmt. Continue to increase exercise, small frequent meals - veggies, protein, decrease starches (breads, pastas, rise), daily morning BG checks.   The 10-year ASCVD risk score (Arnett DK, et al., 2019) is: 16.4%   Values used to calculate the score:     Age: 68 years     Sex: Female     Is Non-Hispanic African American: Yes     Diabetic: Yes     Tobacco smoker: No     Systolic Blood Pressure: 126 mmHg     Is BP treated: Yes     HDL Cholesterol: 41 mg/dL     Total Cholesterol: 148 mg/dL

## 2023-12-04 NOTE — Telephone Encounter (Signed)
 PA has been started, but Cover [employer] Meds states they do not recognize Kellie's NPI. I verified on the NPI website that I have the accurate number.

## 2023-12-04 NOTE — Telephone Encounter (Signed)
 A lot of times when sent to a local pharmacy they find out their insurance wants them to use mail order pharmacy.  This would be a question for your nurse.

## 2023-12-04 NOTE — Telephone Encounter (Signed)
 [facility] faxed refill request for the following medications:   traZODone  (DESYREL ) 100 MG tablet     metFORMIN  (GLUCOPHAGE -XR) 500 MG 24 hr tablet    gabapentin  (NEURONTIN ) 800 MG tablet    valsartan -hydrochlorothiazide  (DIOVAN -HCT) 320-25 MG tablet    rosuvastatin  (CRESTOR ) 40 MG tablet     Please advise.

## 2023-12-17 NOTE — Telephone Encounter (Signed)
Julia Robertson (Key: [facility]) PA Case ID #: [id number]

## 2023-12-23 NOTE — Telephone Encounter (Signed)
Called pt because we keep getting refill request form [facility] for these medications.  She states that she will get them from [facility] for now.

## 2024-01-05 ENCOUNTER — Ambulatory Visit: Payer: Medicare Other

## 2024-01-12 ENCOUNTER — Ambulatory Visit: Payer: Medicare Other

## 2024-01-16 ENCOUNTER — Telehealth: Payer: Self-pay

## 2024-01-16 NOTE — Telephone Encounter (Signed)
 Patient was identified as falling into the [place] Measure - Diabetes.   Patient was: Appointment scheduled for lab or office visit for A1c.

## 2024-01-19 ENCOUNTER — Ambulatory Visit: Payer: Medicare Other

## 2024-03-03 ENCOUNTER — Encounter: Payer: Self-pay | Admitting: Physician Assistant

## 2024-03-03 ENCOUNTER — Ambulatory Visit: Payer: Self-pay | Admitting: Physician Assistant

## 2024-03-03 VITALS — BP 135/69 | HR 87 | Resp 16 | Ht 64.0 in | Wt 243.9 lb

## 2024-03-03 DIAGNOSIS — I152 Hypertension secondary to endocrine disorders: Secondary | ICD-10-CM

## 2024-03-03 DIAGNOSIS — E1159 Type 2 diabetes mellitus with other circulatory complications: Secondary | ICD-10-CM

## 2024-03-03 DIAGNOSIS — Z7984 Long term (current) use of oral hypoglycemic drugs: Secondary | ICD-10-CM

## 2024-03-03 DIAGNOSIS — E1169 Type 2 diabetes mellitus with other specified complication: Secondary | ICD-10-CM | POA: Diagnosis not present

## 2024-03-03 DIAGNOSIS — E1340 Other specified diabetes mellitus with diabetic neuropathy, unspecified: Secondary | ICD-10-CM

## 2024-03-03 DIAGNOSIS — G4700 Insomnia, unspecified: Secondary | ICD-10-CM

## 2024-03-03 DIAGNOSIS — E785 Hyperlipidemia, unspecified: Secondary | ICD-10-CM

## 2024-03-03 NOTE — Progress Notes (Signed)
 Established patient visit  Patient: Julia Robertson   DOB: [birth year]   68 y.o. Female  MRN: [id number] Visit Date: 03/03/2024  Today's healthcare provider: Blane Bunting, PA-C   Chief Complaint  Patient presents with   Follow-up    F/u om HTN and paperwork to be signed.Wants needs rx for Trulicity.   Subjective      Discussed the use of AI scribe software for clinical note transcription with the patient, who gave verbal consent to proceed.  History of Present Illness The patient, with a history of hypertension, hyperlipidemia, diabetes, and neuropathy, presents for a routine follow-up visit. She has been taking valsartan, hydrochlorothiazide, amlodipine for hypertension, and metformin for diabetes. However, she admits to not taking Crestor and Zetia, her cholesterol medications, as regularly as prescribed. Her last HbA1c was 8.8, indicating suboptimal control of her diabetes. She has been referred to a nutritionist, but declined the referral as she had previously seen a nutritionist and feels knowledgeable about dietary management. She also reports problems with sleep, for which she has been prescribed trazodone. The patient experiences neuropathic symptoms, including burning sensations in her feet. She has an upcoming ophthalmology appointment scheduled for diabetic eye screening. Per blood work review, A1c was increased from 8.4-8.8.  Patient was started on Trulicity 0.75 Mg weekly, was referred to nutritionist.  The 10-year ASCVD was 16+ patient was advised to take Crestor and Zetia     03/03/2024    1:18 PM 05/27/2023    2:56 PM 11/19/2022    3:06 PM  Depression screen PHQ 2/9  Decreased Interest 0 0 0  Down, Depressed, Hopeless 0 0 0  PHQ - 2 Score 0 0 0  Altered sleeping 0  0  Tired, decreased energy 0  3  Change in appetite 0  3  Feeling bad or failure about yourself  0  0  Trouble concentrating 0  0  Moving slowly or fidgety/restless 0  0  Suicidal thoughts 0  0   PHQ-9 Score 0  6  Difficult doing work/chores Not difficult at all  Not difficult at all      03/03/2024    1:18 PM  GAD 7 : Generalized Anxiety Score  Nervous, Anxious, on Edge 0  Control/stop worrying 0  Worry too much - different things 0  Trouble relaxing 0  Restless 0  Easily annoyed or irritable 0  Afraid - awful might happen 0  Total GAD 7 Score 0  Anxiety Difficulty Not difficult at all    Medications: Outpatient Medications Prior to Visit  Medication Sig   amLODipine (NORVASC) 10 MG tablet Take 1 tablet (10 mg total) by mouth daily.   cyclobenzaprine (FLEXERIL) 10 MG tablet Take 1 tablet (10 mg total) by mouth at bedtime.   Dulaglutide (TRULICITY) 0.75 MG/0.5ML SOAJ Inject 0.75 mg into the skin once a week.   ezetimibe (ZETIA) 10 MG tablet Take 1 tablet (10 mg total) by mouth daily.   gabapentin (NEURONTIN) 800 MG tablet Take 2 tablets (1,600 mg total) by mouth at bedtime.   ibuprofen (ADVIL) 600 MG tablet Take 600 mg by mouth every 6 (six) hours as needed.   metFORMIN (GLUCOPHAGE-XR) 500 MG 24 hr tablet Take 1 tablet (500 mg total) by mouth daily with breakfast.   Multiple Vitamin (MULTIVITAMIN) capsule Take 1 capsule by mouth daily.   rosuvastatin (CRESTOR) 40 MG tablet Take 1 tablet (40 mg total) by mouth daily.   traZODone (DESYREL) 100 MG tablet Take 1 tablet (  100 mg total) by mouth at bedtime.   valsartan-hydrochlorothiazide (DIOVAN-HCT) 320-25 MG tablet Take 1 tablet by mouth daily.   No facility-administered medications prior to visit.    Review of Systems  All other systems reviewed and are negative.  All negative Except see HPI       Objective    BP 135/69 (BP Location: Right Arm, Patient Position: Sitting)   Pulse 87   Resp 16   Ht 5\' 4"  (1.626 m)   Wt 243 lb 14.4 oz (110.6 kg)   SpO2 98%   BMI 41.87 kg/m     Physical Exam Vitals reviewed.  Constitutional:      General: She is not in acute distress.    Appearance: Normal appearance. She  is well-developed. She is not diaphoretic.  HENT:     Head: Normocephalic and atraumatic.  Eyes:     General: No scleral icterus.    Conjunctiva/sclera: Conjunctivae normal.  Neck:     Thyroid: No thyromegaly.  Cardiovascular:     Rate and Rhythm: Normal rate and regular rhythm.     Pulses: Normal pulses.     Heart sounds: Normal heart sounds. No murmur heard. Pulmonary:     Effort: Pulmonary effort is normal. No respiratory distress.     Breath sounds: Normal breath sounds. No wheezing, rhonchi or rales.  Musculoskeletal:     Cervical back: Neck supple.     Right lower leg: No edema.     Left lower leg: No edema.  Lymphadenopathy:     Cervical: No cervical adenopathy.  Skin:    General: Skin is warm and dry.     Findings: No rash.  Neurological:     Mental Status: She is alert and oriented to person, place, and time. Mental status is at baseline.  Psychiatric:        Mood and Affect: Mood normal.        Behavior: Behavior normal.      No results found for any visits on 03/03/24.       Assessment & Plan Type 2 Diabetes Mellitus HbA1c increased to 8.8%, indicating suboptimal control. Neuropathy likely diabetes-related. Declined further nutritional counseling. - Continue metformin and Trulicity. - Check HbA1c every three months. - Ensure follow-up with ophthalmology in June for diabetic eye exam.  Hypertension Chronic Blood pressure goal <130 mmHg. Not regularly monitoring at home. On valsartan, hydrochlorothiazide, and amlodipine. - Encourage regular home blood pressure monitoring. - Continue valsartan, hydrochlorothiazide, and amlodipine. Will follow-up  Hyperlipidemia Chronic and unstable  inconsistent adherence to Crestor and Zetia. Adherence crucial for cardiovascular risk reduction. The 10-year ASCVD risk score (Arnett DK, et al., 2019) is: 28.1% - Encourage adherence to Crestor and Zetia. Will follow-up  Obesity Chronic  obesity is a risk factor for  diabetes and hypertension. Aware of dietary management strategies. - Encourage lifestyle modifications including diet and exercise. Continue Trulicity and Metformin/weight loss regimen Will adjust after A1C results will be back Will follow-up  General Health Maintenance Does not smoke or consume alcohol. Concerned about Pap smears, reassured for next visit. - Schedule Pap smear with primary care provider at next visit.  Follow-up Follow-up in three months with primary care provider. Blood work including HbA1c needed prior. - Schedule follow-up appointment in three months with primary care provider. - Ensure blood work including HbA1c is done prior to the next appointment.   Hypertension associated with diabetes (HCC) (Primary) - Lipid panel - Hemoglobin A1c - Comprehensive metabolic panel with GFR  Hyperlipidemia associated with type 2 diabetes mellitus (HCC) - Lipid panel - Hemoglobin A1c - Comprehensive metabolic panel with GFR  Type 2 diabetes mellitus with other specified complication, without long-term current use of insulin (HCC)  Neuropathy due to secondary diabetes (HCC) - Lipid panel - Hemoglobin A1c - Comprehensive metabolic panel with GFR - metFORMIN (GLUCOPHAGE-XR) 500 MG 24 hr tablet; Take 1 tablet (500 mg total) by mouth 2 (two) times daily with a meal.  Dispense: 60 tablet; Refill: 1 - glipiZIDE (GLUCOTROL) 5 MG tablet; Take 1 tablet (5 mg total) by mouth 2 (two) times daily before a meal.  Dispense: 60 tablet; Refill: 1 Trulicity 0.75mg  , will adjust after A1C results will be back  Morbid obesity (HCC) - Lipid panel - Hemoglobin A1c - Comprehensive metabolic panel with GFR  Orders Placed This Encounter  Procedures   Lipid panel    Has the patient fasted?:   Yes   Hemoglobin A1c   Comprehensive metabolic panel with GFR    Has the patient fasted?:   Yes    Return in about 3 months (around 06/02/2024) for chronic disease f/u with primary.   The patient was  advised to call back or seek an in-person evaluation if the symptoms worsen or if the condition fails to improve as anticipated.  I discussed the assessment and treatment plan with the patient. The patient was provided an opportunity to ask questions and all were answered. The patient agreed with the plan and demonstrated an understanding of the instructions.  I, Codylee Patil, PA-C have reviewed all documentation for this visit. The documentation on 03/03/2024  for the exam, diagnosis, procedures, and orders are all accurate and complete.  Blane Bunting, [facility], MMS [facility] [phone] (phone) [phone] (fax)  [facility]

## 2024-03-04 ENCOUNTER — Encounter: Payer: Self-pay | Admitting: Physician Assistant

## 2024-03-04 LAB — HEMOGLOBIN A1C
Est. average glucose Bld gHb Est-mCnc: 272 mg/dL
Hgb A1c MFr Bld: 11.1 % — ABNORMAL HIGH (ref 4.8–5.6)

## 2024-03-04 LAB — COMPREHENSIVE METABOLIC PANEL WITH GFR
ALT: 34 IU/L — ABNORMAL HIGH (ref 0–32)
AST: 50 IU/L — ABNORMAL HIGH (ref 0–40)
Albumin: 4.2 g/dL (ref 3.9–4.9)
Alkaline Phosphatase: 77 IU/L (ref 44–121)
BUN/Creatinine Ratio: 11 — ABNORMAL LOW (ref 12–28)
BUN: 11 mg/dL (ref 8–27)
Bilirubin Total: 0.2 mg/dL (ref 0.0–1.2)
CO2: 23 mmol/L (ref 20–29)
Calcium: 9.2 mg/dL (ref 8.7–10.3)
Chloride: 96 mmol/L (ref 96–106)
Creatinine, Ser: 1 mg/dL (ref 0.57–1.00)
Globulin, Total: 3.3 g/dL (ref 1.5–4.5)
Glucose: 433 mg/dL — ABNORMAL HIGH (ref 70–99)
Potassium: 3.8 mmol/L (ref 3.5–5.2)
Sodium: 137 mmol/L (ref 134–144)
Total Protein: 7.5 g/dL (ref 6.0–8.5)
eGFR: 63 mL/min/{1.73_m2} (ref >59)

## 2024-03-04 LAB — LIPID PANEL
Chol/HDL Ratio: 6.1 ratio — ABNORMAL HIGH (ref 0.0–4.4)
Cholesterol, Total: 233 mg/dL — ABNORMAL HIGH (ref 100–199)
HDL: 38 mg/dL — ABNORMAL LOW (ref >39)
LDL Chol Calc (NIH): 138 mg/dL — ABNORMAL HIGH (ref 0–99)
Triglycerides: 313 mg/dL — ABNORMAL HIGH (ref 0–149)
VLDL Cholesterol Cal: 57 mg/dL — ABNORMAL HIGH (ref 5–40)

## 2024-03-04 MED ORDER — METFORMIN HCL ER 500 MG PO TB24: 500.0000 mg | ORAL_TABLET | Freq: Two times a day (BID) | ORAL | 1 refills | Status: DC

## 2024-03-04 MED ORDER — GLIPIZIDE 5 MG PO TABS
5.0000 mg | ORAL_TABLET | Freq: Two times a day (BID) | ORAL | 1 refills | Status: DC
Start: 2024-03-04 — End: 2024-08-12

## 2024-03-04 NOTE — Progress Notes (Signed)
 Check if she takes trulicity . See a message below

## 2024-03-06 MED ORDER — TRULICITY 0.75 MG/0.5ML ~~LOC~~ SOAJ: 0.7500 mg | SUBCUTANEOUS | 0 refills | Status: DC

## 2024-03-06 MED ORDER — TRULICITY 0.75 MG/0.5ML ~~LOC~~ SOAJ: 0.7500 mg | SUBCUTANEOUS | 2 refills | Status: DC

## 2024-03-10 LAB — HM DIABETES EYE EXAM

## 2024-05-02 ENCOUNTER — Other Ambulatory Visit: Payer: Self-pay | Admitting: Family Medicine

## 2024-05-02 DIAGNOSIS — G4700 Insomnia, unspecified: Secondary | ICD-10-CM

## 2024-05-13 ENCOUNTER — Other Ambulatory Visit (HOSPITAL_COMMUNITY): Payer: Self-pay

## 2024-06-02 ENCOUNTER — Encounter: Payer: Self-pay | Admitting: Family Medicine

## 2024-06-02 ENCOUNTER — Other Ambulatory Visit: Payer: Self-pay | Admitting: Family Medicine

## 2024-06-02 ENCOUNTER — Ambulatory Visit (INDEPENDENT_AMBULATORY_CARE_PROVIDER_SITE_OTHER): Admitting: Family Medicine

## 2024-06-02 ENCOUNTER — Ambulatory Visit: Payer: Self-pay | Admitting: Family Medicine

## 2024-06-02 VITALS — BP 148/88 | HR 81 | Temp 97.8°F | Ht 64.0 in | Wt 235.0 lb

## 2024-06-02 DIAGNOSIS — E1165 Type 2 diabetes mellitus with hyperglycemia: Secondary | ICD-10-CM

## 2024-06-02 DIAGNOSIS — I152 Hypertension secondary to endocrine disorders: Secondary | ICD-10-CM | POA: Diagnosis not present

## 2024-06-02 DIAGNOSIS — R8290 Unspecified abnormal findings in urine: Secondary | ICD-10-CM | POA: Diagnosis not present

## 2024-06-02 DIAGNOSIS — R8281 Pyuria: Secondary | ICD-10-CM | POA: Diagnosis not present

## 2024-06-02 DIAGNOSIS — E1159 Type 2 diabetes mellitus with other circulatory complications: Secondary | ICD-10-CM

## 2024-06-02 DIAGNOSIS — E1142 Type 2 diabetes mellitus with diabetic polyneuropathy: Secondary | ICD-10-CM

## 2024-06-02 DIAGNOSIS — Z1231 Encounter for screening mammogram for malignant neoplasm of breast: Secondary | ICD-10-CM

## 2024-06-02 LAB — POCT URINALYSIS DIPSTICK
Bilirubin, UA: POSITIVE — AB
Blood, UA: POSITIVE — AB
Glucose, UA: POSITIVE — AB
Ketones, UA: POSITIVE — AB
Nitrite, UA: POSITIVE — AB
Protein, UA: POSITIVE — AB
Spec Grav, UA: 1.02 (ref 1.010–1.025)
Urobilinogen, UA: 1 U/dL
pH, UA: 6 (ref 5.0–8.0)

## 2024-06-02 LAB — POCT GLYCOSYLATED HEMOGLOBIN (HGB A1C): Hemoglobin A1C: 10.4 % — AB (ref 4.0–5.6)

## 2024-06-02 MED ORDER — NITROFURANTOIN MONOHYD MACRO 100 MG PO CAPS: 100.0000 mg | ORAL_CAPSULE | Freq: Two times a day (BID) | ORAL | 0 refills | Status: DC

## 2024-06-02 NOTE — Progress Notes (Signed)
 Established Patient Office Visit  Introduced to nurse practitioner role and practice setting.  All questions answered.  Discussed provider/patient relationship and expectations.  Subjective   Patient ID: Julia Robertson, female    DOB: 07-16-1956  Age: 69 y.o. MRN: [id number]  Chief Complaint  Patient presents with   Hypertension    Hypertension Patient is here for follow-up of elevated blood pressure. She is walking everyday at the [place] with her job. She is not adherent to a low-salt diet. Blood pressure is monitored at home everyday.  Patient denies any symptoms.   Diabetes    Julia Robertson is a 68 y.o. female who presents for follow up of diabetes.. Patient denies any symptoms. Monitors glucose a couple of times a month states it has been in the 200s when checked at home.  Eye exam was done with Dr. Carolee on [address]   Patient wants to discuss Trulicity  to be able to get approved for it.   Discussed the use of AI scribe software for clinical note transcription with the patient, who gave verbal consent to proceed.  History of Present Illness Julia Robertson is a 68 year old female with diabetes who presents for diabetes management and medication review.  Her last hemoglobin A1c was 11.1%, which has now decreased to 10.4%. She is not taking metformin  as prescribed due to gastrointestinal side effects, specifically diarrhea, and typically takes it only once or twice a week. She is taking glipizide  twice daily. She has not been able to afford Trulicity  due to high costs and insurance coverage issues.  She has a history of cataracts, with the left eye already operated on. The cataract in the right eye is progressing, and she has an upcoming appointment in [place] for further evaluation. She missed a previous appointment due to prioritizing her sister's cancer treatment.  Concerns for pyruia today: Her urine test was positive for white blood cells and nitrates. No  allergies to antibiotics.  She experiences numbness and tingling in her feet, which she attributes to neuropathy. She wears [employer]chers shoes and ensures they are not overly tight to avoid tripping.       06/02/2024    4:10 PM 03/03/2024    1:18 PM 05/27/2023    2:56 PM  Depression screen PHQ 2/9  Decreased Interest 0 0 0  Down, Depressed, Hopeless 0 0 0  PHQ - 2 Score 0 0 0  Altered sleeping 0 0   Tired, decreased energy 0 0   Change in appetite 0 0   Feeling bad or failure about yourself  0 0   Trouble concentrating 0 0   Moving slowly or fidgety/restless 0 0   Suicidal thoughts 0 0   PHQ-9 Score 0 0   Difficult doing work/chores Not difficult at all Not difficult at all        06/02/2024    4:10 PM 03/03/2024    1:18 PM  GAD 7 : Generalized Anxiety Score  Nervous, Anxious, on Edge 0 0  Control/stop worrying 0 0  Worry too much - different things 0 0  Trouble relaxing 0 0  Restless 0 0  Easily annoyed or irritable 0 0  Afraid - awful might happen 0 0  Total GAD 7 Score 0 0  Anxiety Difficulty Not difficult at all Not difficult at all     ROS  Negative unless indicated in HPI   Objective:     BP (!) 148/88 (BP  Location: Left Arm, Patient Position: Sitting, Cuff Size: Normal)   Pulse 81   Temp 97.8 F (36.6 C) (Oral)   Ht 5' 4 (1.626 m)   Wt 235 lb (106.6 kg)   SpO2 100%   BMI 40.34 kg/m    Physical Exam Constitutional:      General: She is not in acute distress.    Appearance: Normal appearance. She is obese. She is not toxic-appearing or diaphoretic.  HENT:     Head: Normocephalic.     Nose: Nose normal.     Mouth/Throat:     Mouth: Mucous membranes are moist.     Dentition: Abnormal dentition. Dental caries present.     Pharynx: Oropharynx is clear.  Eyes:     Extraocular Movements: Extraocular movements intact.     Pupils: Pupils are equal, round, and reactive to light.  Cardiovascular:     Rate and Rhythm: Normal rate and regular rhythm.      Pulses: Normal pulses.     Heart sounds: Normal heart sounds. No murmur heard.    No friction rub. No gallop.  Pulmonary:     Effort: No respiratory distress.     Breath sounds: No stridor. No wheezing, rhonchi or rales.  Chest:     Chest wall: No tenderness.  Musculoskeletal:     Right lower leg: No edema.     Left lower leg: No edema.  Skin:    General: Skin is warm and dry.     Capillary Refill: Capillary refill takes less than 2 seconds.  Neurological:     General: No focal deficit present.     Mental Status: She is alert and oriented to person, place, and time. Mental status is at baseline.  Psychiatric:        Attention and Perception: Attention and perception normal.        Mood and Affect: Mood and affect normal.        Speech: Speech normal.        Behavior: Behavior normal. Behavior is cooperative.        Thought Content: Thought content normal.        Cognition and Memory: Cognition and memory normal.        Judgment: Judgment normal.      Results for orders placed or performed in visit on 06/02/24  POCT HgB A1C  Result Value Ref Range   Hemoglobin A1C 10.4 (A) 4.0 - 5.6 %   HbA1c POC (<> result, manual entry)     HbA1c, POC (prediabetic range)     HbA1c, POC (controlled diabetic range)    POCT Urinalysis Dipstick  Result Value Ref Range   Color, UA Dark Yellow    Clarity, UA Cloudy    Glucose, UA Positive (A) Negative   Bilirubin, UA Positive (A)    Ketones, UA Positive (A)    Spec Grav, UA 1.020 1.010 - 1.025   Blood, UA Positive (A)    pH, UA 6.0 5.0 - 8.0   Protein, UA Positive (A) Negative   Urobilinogen, UA 1.0 0.2 or 1.0 E.U./dL   Nitrite, UA Positive (A)    Leukocytes, UA Small (1+) (A) Negative   Appearance     Odor        The 10-year ASCVD risk score (Arnett DK, et al., 2019) is: 33.9%    Assessment & Plan:  Hypertension associated with diabetes (HCC) -     POCT glycosylated hemoglobin (Hb A1C)  Pyuria -  POCT urinalysis  dipstick -     Urine Culture -     Nitrofurantoin  Monohyd Macro; Take 1 capsule (100 mg total) by mouth 2 (two) times daily.  Dispense: 10 capsule; Refill: 0  Encounter for screening mammogram for malignant neoplasm of breast -     3D Screening Mammogram, Left and Right; Future  Type 2 diabetes mellitus with hyperglycemia, without long-term current use of insulin (HCC) -     AMB Referral VBCI Care Management     Assessment and Plan Assessment & Plan Type 2 Diabetes Mellitus A1c improved to 10.4% but remains above target and uncontrolled. Inconsistent metformin  use due to side effects and cost issues with Trulicity . Prefers to avoid insulin. Has neuropathy. Pt motivated to improved. Previously nutrition referral placed, which pt canceled. Stressed importance of monitoring daily fasting glucose, goal 80-130.  - Refer to pharmacist for cost-effective medication options - VBCI - Encourage metformin  once daily to reduce gastrointestinal side effects. - Continue glipizide  twice daily. - Recheck A1c in three months. - Foot exam UTD - Eye exam - pt to have Dr. Norvin fax over results - On statin - Continue to make conscious decisions for well balanced diet smaller portions with increase protein, fruits, veggies, water as drink of choice, decrease starches, processed foods, and saturated fats. Increase weekly exercise - 150 minutes per week.   Peripheral Neuropathy Numbness and tingling in feet consistent with diabetic neuropathy. - Advise wearing well-fitting shoes to prevent falls. - continue gabapentin   Hypertension Chronic, borderline GOAL<130/80 Typically well controlled - pt just got off work and was flustered, maybe situational - close monitor at home with upper arm cuff automatic Limit sodium processed foods. Blood pressure elevated, atypical as usually well-managed. Continue valsartan -hydrochlorothiazide  320-25  Malodor from urine, most likely UTI, Urinalysis shows positive white  blood cells and nitrates, indicating UTI. No known antibiotic allergies, will send for culture as well - Prescribe Macorbid - Send urine for culture to ensure antibiotic coverage.  General Health Maintenance Due for mammogram, last one in May 2024. - Order mammogram   Return in about 3 months (around 09/02/2024) for DMII and  A1C.   I, Curtis DELENA Boom, FNP, have reviewed all documentation for this visit. The documentation on 06/02/24 for the exam, diagnosis, procedures, and orders are all accurate and complete.   Curtis DELENA Boom, FNP

## 2024-06-02 NOTE — Patient Instructions (Signed)

## 2024-06-03 ENCOUNTER — Ambulatory Visit: Payer: Self-pay | Admitting: Family Medicine

## 2024-06-07 ENCOUNTER — Ambulatory Visit: Payer: Self-pay | Admitting: Family Medicine

## 2024-06-07 LAB — URINE CULTURE

## 2024-06-17 ENCOUNTER — Telehealth: Payer: Self-pay

## 2024-06-17 NOTE — Progress Notes (Signed)
 Complex Care Management Note Care Guide Note  06/17/2024 Name: Julia Robertson MRN: [id number] DOB: January 13, 1956   Complex Care Management Outreach Attempts: An unsuccessful telephone outreach was attempted today to offer the patient information about available complex care management services.  Follow Up Plan:  Additional outreach attempts will be made to offer the patient complex care management information and services.   Encounter Outcome:  No Answer  Dreama Lynwood [facility]  [facility], [facility] Health Care Management Assistant Direct Dial: [phone]  Fax: [phone]

## 2024-06-27 ENCOUNTER — Other Ambulatory Visit: Payer: Self-pay | Admitting: Physician Assistant

## 2024-06-27 DIAGNOSIS — E1169 Type 2 diabetes mellitus with other specified complication: Secondary | ICD-10-CM

## 2024-07-01 NOTE — Progress Notes (Signed)
 Complex Care Management Note Care Guide Note  07/01/2024 Name: Julia Robertson MRN: [id number] DOB: 09/11/1956   Complex Care Management Outreach Attempts: A second unsuccessful outreach was attempted today to offer the patient with information about available complex care management services.  Follow Up Plan:  Additional outreach attempts will be made to offer the patient complex care management information and services.   Encounter Outcome:  Patient Request to Call Back  Julia Robertson [facility]  [facility], [facility] Health Care Management Assistant Direct Dial: [phone]  Fax: [phone]

## 2024-07-06 NOTE — Progress Notes (Signed)
 Complex Care Management Note  Care Guide Note 07/06/2024 Name: Julia Robertson MRN: [id number] DOB: 1956/04/24  Julia Robertson is a 68 y.o. year old female who sees Wellington Curtis LABOR, FNP for primary care. I reached out to Julia CHRISTELLA [name] by phone today to offer complex care management services.  [name] was given information about Complex Care Management services today including:   The Complex Care Management services include support from the care team which includes your Nurse Care Manager, Clinical Social Worker, or Pharmacist.  The Complex Care Management team is here to help remove barriers to the health concerns and goals most important to you. Complex Care Management services are voluntary, and the patient may decline or stop services at any time by request to their care team member.   Complex Care Management Consent Status: Patient agreed to services and verbal consent obtained.   Follow up plan:  Telephone appointment with complex care management team member scheduled for:  07/15/24 at 2:00 p.m.   Encounter Outcome:  Patient Scheduled  Dreama Lynwood [facility]  [place], [facility] Health Care Management Assistant Direct Dial: [phone]  Fax: [phone]

## 2024-07-15 ENCOUNTER — Ambulatory Visit (INDEPENDENT_AMBULATORY_CARE_PROVIDER_SITE_OTHER)

## 2024-07-15 ENCOUNTER — Telehealth: Payer: Self-pay

## 2024-07-15 DIAGNOSIS — Z7984 Long term (current) use of oral hypoglycemic drugs: Secondary | ICD-10-CM | POA: Diagnosis not present

## 2024-07-15 DIAGNOSIS — E1169 Type 2 diabetes mellitus with other specified complication: Secondary | ICD-10-CM

## 2024-07-15 MED ORDER — LANCET DEVICE MISC
1 refills | Status: AC
Start: 2024-07-15 — End: ?

## 2024-07-15 MED ORDER — LANCETS MISC. MISC: 0 refills | Status: DC

## 2024-07-15 MED ORDER — INSULIN GLARGINE 100 UNIT/ML SOLOSTAR PEN: 10.0000 [IU] | PEN_INJECTOR | Freq: Every day | SUBCUTANEOUS | 6 refills | Status: DC

## 2024-07-15 MED ORDER — BLOOD GLUCOSE MONITORING SUPPL DEVI: 1.0000 | Freq: Three times a day (TID) | 0 refills | Status: AC

## 2024-07-15 MED ORDER — PEN NEEDLES 32G X 5 MM MISC: 3 refills | Status: DC

## 2024-07-15 MED ORDER — OZEMPIC (0.25 OR 0.5 MG/DOSE) 2 MG/3ML ~~LOC~~ SOPN: 0.2500 mg | PEN_INJECTOR | SUBCUTANEOUS | Status: DC

## 2024-07-15 MED ORDER — BLOOD GLUCOSE TEST VI STRP: ORAL_STRIP | 6 refills | Status: DC

## 2024-07-15 NOTE — Telephone Encounter (Signed)
 Gave pt a call to see if we can do PAP Novo Nordisk Ozempic  online,pt did not answer left a HIPAA VM.

## 2024-07-15 NOTE — Progress Notes (Unsigned)
 S:     Chief Complaint  Patient presents with   Diabetes    Reason for visit: ?  Julia Robertson is a 68 y.o. female with a history of diabetes (type 2), who presents today for an initial diabetes pharmacotherapy visit.? Pertinent PMH also includes HTN, HLD, insomnia, and obesity.  Care Team: Primary Care Provider: Wellington Curtis LABOR, FNP  At last visit with PCP on 06/02/24, A1c was improved, but still above goal at 10.4%. She endorsed consistent BG readings in the 200s. Patient expressed interest in GLP1 therapy at that time and would prefer to avoid insulin  therapy. Additionally, BP was elevated at 148/88 mmHg.   Current diabetes medications include: Trulicity  0.75 mg weekly(has not started), glipizide  5 mg BID AC, metformin  XR 500 mg BID Previous diabetes medications include: Mounjaro  (cost), Ozempic  (cost), Januvia  (hairloss) Current hypertension medications include: amlodipine  10 mg daily, valsartan /hydrochlorothiazide  320/25 mg daily Current hyperlipidemia medications include: rosuvastatin  40 mg daily, ezetimibe  10 mg daily  Patient reports adherence to taking all medications as prescribed. Patient reports she has been unable to start Trulicity  d/t cost of medication ($200+/month)  Have you been experiencing any side effects to the medications prescribed? Yes - reports significant GI distress (diarrhea) with metformin  Do you have any problems obtaining medications due to transportation or finances? yes Insurance coverage: UHC Medicare  Reported home fasting blood sugars: not checking at home - needs to get a new meter  Patient denies nocturia (nighttime urination).  Patient reports neuropathy (nerve pain). Patient reports visual changes. Patient reports self foot exams.   Patient reported dietary habits: Eats 1 meals/day Lunch/Dinner: protein, vegetables Drinks: water, rare soda/sweet tea *reports weakness of sweets (cakes) about 2-3x per week  Patient-reported  exercise habits: walks 2 times a week in her neighborhood DM Prevention:  Statin: Taking; high intensity.?  History of albuminuria? no, last UACR on 08/28/23 = 21 mg/g Last eye exam: 03/10/24 Lab Results  Component Value Date   HMDIABEYEEXA  03/10/2024     Comment:     UNABLE TO DETERMINE   Last foot exam: No foot exam found Tobacco Use:   Tobacco Use: Low Risk  (06/02/2024)   Patient History    Smoking Tobacco Use: Never    Smokeless Tobacco Use: Never    Passive Exposure: Not on file   O:   Vitals:  Wt Readings from Last 3 Encounters:  06/02/24 235 lb (106.6 kg)  03/03/24 243 lb 14.4 oz (110.6 kg)  12/03/23 248 lb (112.5 kg)   BP Readings from Last 3 Encounters:  06/02/24 (!) 148/88  03/03/24 135/69  12/03/23 126/82   Pulse Readings from Last 3 Encounters:  06/02/24 81  03/03/24 87  12/03/23 78     Labs:?  Lab Results  Component Value Date   HGBA1C 10.4 (A) 06/02/2024   HGBA1C 11.1 (H) 03/03/2024   HGBA1C 8.8 (H) 12/03/2023   GLUCOSE 433 (H) 03/03/2024   MICRALBCREAT 21 08/28/2023   MICRALBCREAT 6 02/18/2023   CREATININE 1.00 03/03/2024   CREATININE 0.71 12/03/2023   CREATININE 0.78 08/28/2023    Lab Results  Component Value Date   CHOL 233 (H) 03/03/2024   LDLCALC 138 (H) 03/03/2024   LDLCALC 65 12/03/2023   LDLCALC 131 (H) 08/28/2023   HDL 38 (L) 03/03/2024   TRIG 313 (H) 03/03/2024   TRIG 265 (H) 12/03/2023   TRIG 214 (H) 08/28/2023   ALT 34 (H) 03/03/2024   ALT 25 12/03/2023   AST  50 (H) 03/03/2024   AST 24 12/03/2023      Chemistry      Component Value Date/Time   NA 137 03/03/2024 1347   NA 138 09/23/2012 0547   K 3.8 03/03/2024 1347   K 4.1 09/23/2012 0547   CL 96 03/03/2024 1347   CL 102 09/23/2012 0547   CO2 23 03/03/2024 1347   CO2 27 09/23/2012 0547   BUN 11 03/03/2024 1347   BUN 14 09/23/2012 0547   CREATININE 1.00 03/03/2024 1347   CREATININE 0.92 09/23/2012 0547      Component Value Date/Time   CALCIUM  9.2 03/03/2024  1347   CALCIUM  8.8 09/23/2012 0547   ALKPHOS 77 03/03/2024 1347   AST 50 (H) 03/03/2024 1347   ALT 34 (H) 03/03/2024 1347   BILITOT 0.2 03/03/2024 1347       The 10-year ASCVD risk score (Arnett DK, et al., 2019) is: 33.9%  Lab Results  Component Value Date   MICRALBCREAT 21 08/28/2023   MICRALBCREAT 6 02/18/2023    A/P: Diabetes currently uncontrolled with a most recent A1c of 10.4% on 06/02/24, which is down from 11.1% on 03/03/24. Medication adherence appears appropriate. Not currently monitoring BG as meter is nonfunctional. Unable to afford Trulicity  d/t cost - will pursue coverage of Ozempic  via [employer] PAP. Patient is interested in discontinuing metformin  d/t significant GI upset. Discussed benefit of insulin  initiation given most recent A1c, duration of time until able to receive approval for PAP, and desire to discontinue metformin . Patient is agreeable.  -Started basal insulin  Lantus /Basaglar /Semglee  (insulin  glargine) 10 units daily -Switched GLP-1 from Trulicity  (dulaglutide ) to Ozempic  (semaglutide ) 0.25 mg weekly. Will send to CPhT, Shasta Spear, to start application process.  -Discontinued metformin   -Continued glipizide  5 mg BID with meals  -Patient educated on purpose, proper use, and potential adverse effects of insulin .  -Extensively discussed pathophysiology of diabetes, recommended lifestyle interventions, dietary effects on blood sugar control.  -Will send prescription for BG testing supplies -Next A1c anticipated 08/2024.   ASCVD risk - primary prevention in patient with diabetes. Last LDL is 138 mg/dL, not at goal of <29 mg/dL. high intensity statin indicated.  -Continued rosuvastatin  40 mg daily.  -Continued ezetimibe  10 mg daily  Written patient instructions provided. Patient verbalized understanding of treatment plan.  Total time in face to face counseling 45 minutes.     Follow-up:  Pharmacist on 08/12/24 PCP clinic visit on 09/02/24  [name], PharmD Clinical Pharmacist [facility] [phone]

## 2024-07-15 NOTE — Patient Instructions (Signed)
 Start insulin  glargine 10 units once daily (blood sugar) Start Ozempic  0.25 mg once weekly when received from patient assistance. Please watch for a call from [employer], Pharmacologist.  STOP metformin   Continue glipizide  5 mg twice daily with meals (blood sugar) Continue amlodipine  10 mg daily (high blood pressure) Continue valsartan /hydrochlorothiazide  320/25 mg daily (high blood pressure) Continue rosuvastatin  40 mg daily (cholesterol) Continue ezetimibe  10 mg daily (cholesterol)

## 2024-07-16 NOTE — Telephone Encounter (Signed)
 Patient return call,gave consent to do PAP Novo Nordisk Ozempic  online faxed provider portion to sign and date and fax it back to [phone].

## 2024-07-21 ENCOUNTER — Other Ambulatory Visit: Payer: Self-pay

## 2024-07-21 ENCOUNTER — Telehealth: Payer: Self-pay

## 2024-07-21 DIAGNOSIS — E1169 Type 2 diabetes mellitus with other specified complication: Secondary | ICD-10-CM

## 2024-07-21 MED ORDER — INSULIN GLARGINE 100 UNIT/ML SOLOSTAR PEN
10.0000 [IU] | PEN_INJECTOR | Freq: Every day | SUBCUTANEOUS | 6 refills | Status: DC
Start: 2024-07-21 — End: 2024-08-12
  Filled 2024-07-21: qty 9, 90d supply, fill #0
  Filled 2024-07-21: qty 15, 150d supply, fill #0
  Filled 2024-08-09: qty 9, 84d supply, fill #1

## 2024-07-21 MED ORDER — INSULIN PEN NEEDLE 31G X 5 MM MISC
3 refills | Status: AC
  Filled 2024-07-21 (×2): qty 100, 100d supply, fill #0

## 2024-07-21 NOTE — Telephone Encounter (Signed)
 Noted! Thank you

## 2024-07-21 NOTE — Telephone Encounter (Signed)
 Copied from CRM [id number]. Topic: Clinical - Medication Question >> Jul 21, 2024 10:21 AM Delon DASEN wrote: Reason for CRM: insulin  glargine (LANTUS ) 100 UNIT/ML Solostar Pen- follow up on getting for cheaper through [facility]- [phone]

## 2024-07-21 NOTE — Progress Notes (Signed)
 Brief Telephone Documentation Reason for Call: Patient left message regarding prescription cost  Summary of Call: Per [facility] pharmacy, they will not break boxes of insulin  pens. Prescription was >$100, as it was run for a 90 day supply. Will send prescription to [facility] pharmacy at [facility].  Patient agreeable. Will coordinate with pharmacy to verify cost.   Adelise Buswell E. Marsh, PharmD Clinical Pharmacist [facility] [phone]

## 2024-07-22 ENCOUNTER — Other Ambulatory Visit: Payer: Self-pay

## 2024-07-22 DIAGNOSIS — H18413 Arcus senilis, bilateral: Secondary | ICD-10-CM | POA: Diagnosis not present

## 2024-07-22 DIAGNOSIS — Z961 Presence of intraocular lens: Secondary | ICD-10-CM | POA: Diagnosis not present

## 2024-07-22 DIAGNOSIS — H2511 Age-related nuclear cataract, right eye: Secondary | ICD-10-CM | POA: Diagnosis not present

## 2024-07-22 DIAGNOSIS — H40023 Open angle with borderline findings, high risk, bilateral: Secondary | ICD-10-CM | POA: Diagnosis not present

## 2024-07-22 NOTE — Telephone Encounter (Signed)
 Faxing provider portion today,has not received pap(Ozempic ) back from provider office.

## 2024-07-23 ENCOUNTER — Telehealth: Payer: Self-pay

## 2024-07-23 NOTE — Telephone Encounter (Signed)
 Copied from CRM [id number]. Topic: Clinical - Medication Question >> Jul 23, 2024 11:21 AM Nathanel BROCKS wrote: Reason for CRM: pt is requesting that Isaiah give her a call about Monjuro.

## 2024-07-23 NOTE — Telephone Encounter (Signed)
 Received provider portion of PAP Novo Nordisk,faxed to [employer] and will follow up in a few days with company.

## 2024-07-27 NOTE — Telephone Encounter (Signed)
 Brief Telephone Documentation Reason for Call: Patient left message regarding question for pharmacist   Summary of Call: Called patient regarding PAP for Ozempic . Currently no updates on enrollment. Informed patient of typical timeline for approval (4-6 weeks).   Follow Up: Patient given direct line for pharmacy technician for further questions regarding enrollment updates  Arijana Narayan E. Marsh, PharmD Clinical Pharmacist [facility] [phone]

## 2024-07-28 ENCOUNTER — Telehealth: Payer: Self-pay

## 2024-07-28 NOTE — Telephone Encounter (Signed)
 Have contacted [employer] to provide this information

## 2024-07-28 NOTE — Telephone Encounter (Signed)
 Received a letter from [employer] ,pt is missing signature on pg #2 and Ins imf, submitted her PAP online today with requested imf will follow up in a couple of days

## 2024-07-28 NOTE — Telephone Encounter (Signed)
 Copied from CRM #[id number]. Topic: General - Other >> Jul 28, 2024 10:58 AM Debby BROCKS wrote: Reason for CRM: Deneshia from [employer] [phone]) states that in order to continue with the patient's Patient Assistance Program they need [facility] License as well as Expiration Date  Patient ID: [id number] for Julia Robertson

## 2024-07-30 NOTE — Telephone Encounter (Signed)
 Received a letter from [employer] Ozempic  was APPROVED thru 11/24/24,left a HIPAA VM at pt number.

## 2024-08-04 ENCOUNTER — Other Ambulatory Visit: Payer: Self-pay | Admitting: Family Medicine

## 2024-08-04 DIAGNOSIS — E1340 Other specified diabetes mellitus with diabetic neuropathy, unspecified: Secondary | ICD-10-CM

## 2024-08-09 ENCOUNTER — Other Ambulatory Visit: Payer: Self-pay

## 2024-08-09 ENCOUNTER — Telehealth: Payer: Self-pay

## 2024-08-09 MED ORDER — TRULICITY 0.75 MG/0.5ML ~~LOC~~ SOAJ
0.7500 mg | SUBCUTANEOUS | 2 refills | Status: DC
  Filled 2024-08-09 (×2): qty 4, 56d supply, fill #0

## 2024-08-09 MED ORDER — INSULIN GLARGINE 100 UNIT/ML SOLOSTAR PEN
10.0000 [IU] | PEN_INJECTOR | Freq: Every day | SUBCUTANEOUS | 6 refills | Status: DC
  Filled 2024-08-09: qty 15, 150d supply, fill #0

## 2024-08-09 MED ORDER — ROSUVASTATIN CALCIUM 40 MG PO TABS
40.0000 mg | ORAL_TABLET | Freq: Every day | ORAL | 3 refills | Status: DC
  Filled 2024-08-09: qty 90, 90d supply, fill #0

## 2024-08-09 MED ORDER — GLUCOSE BLOOD VI STRP: ORAL_STRIP | 6 refills | Status: AC

## 2024-08-09 MED ORDER — VALSARTAN-HYDROCHLOROTHIAZIDE 320-25 MG PO TABS
1.0000 | ORAL_TABLET | Freq: Every day | ORAL | 3 refills | Status: DC
Start: 2024-05-02 — End: 2024-09-02
  Filled 2024-08-09: qty 90, 90d supply, fill #0

## 2024-08-09 NOTE — Telephone Encounter (Signed)
 Per pharmacy, because a 86-month supply was dispensed, cannot fill again on insurance until 09/27/24.   She was recently approved for PAP through [employer] for Ozempic . First dose given on 08/03/24.   Because insulin  was expensive at the pharmacy level and she is unable to obtain a refill at this time, will attempt to get Tresiba  via PAP through [employer].  Patient is scheduled for follow up with me later this week on 08/12/24. Will see if any basal insulin  samples are available at that time as well.   Merlen Gurry E. Marsh, PharmD Clinical Pharmacist [facility] [phone]

## 2024-08-09 NOTE — Telephone Encounter (Unsigned)
 Copied from CRM [id number]. Topic: Clinical - Prescription Issue >> Aug 09, 2024  9:32 AM Julia Robertson wrote: Reason for CRM: Patient states her Insulin  accidentally got thrown away. Is wondering what can be done to help her or if she is able to get another insulin  pen. Asked to speak with Isaiah the pharmacist.  Patient can be reached at [phone]

## 2024-08-09 NOTE — Telephone Encounter (Signed)
 Faxex provider portion of [employer] Tresiba  PAP to provider office to sign and date ,pt gave consent to do PAP on line was submitted today.

## 2024-08-10 NOTE — Telephone Encounter (Signed)
 Received provider portion but missing PG#9, re-faxing again.

## 2024-08-12 ENCOUNTER — Ambulatory Visit

## 2024-08-12 VITALS — BP 111/76 | HR 81

## 2024-08-12 DIAGNOSIS — Z7984 Long term (current) use of oral hypoglycemic drugs: Secondary | ICD-10-CM

## 2024-08-12 DIAGNOSIS — E1169 Type 2 diabetes mellitus with other specified complication: Secondary | ICD-10-CM

## 2024-08-12 DIAGNOSIS — Z794 Long term (current) use of insulin: Secondary | ICD-10-CM | POA: Diagnosis not present

## 2024-08-12 DIAGNOSIS — Z7985 Long-term (current) use of injectable non-insulin antidiabetic drugs: Secondary | ICD-10-CM | POA: Diagnosis not present

## 2024-08-12 MED ORDER — OZEMPIC (0.25 OR 0.5 MG/DOSE) 2 MG/3ML ~~LOC~~ SOPN
PEN_INJECTOR | SUBCUTANEOUS | Status: DC
Start: 2024-08-12 — End: 2024-09-16

## 2024-08-12 MED ORDER — INSULIN DEGLUDEC 100 UNIT/ML ~~LOC~~ SOPN: 10.0000 [IU] | PEN_INJECTOR | Freq: Every day | SUBCUTANEOUS | Status: DC

## 2024-08-12 NOTE — Progress Notes (Signed)
 S:     Chief Complaint  Patient presents with   Medication Management    Reason for visit: ?  Julia Robertson is a 68 y.o. female with a history of diabetes (type 2), who presents today for an initial diabetes pharmacotherapy visit.? Pertinent PMH also includes HTN, HLD, insomnia, and obesity.  Care Team: Primary Care Provider: Wellington Curtis LABOR, FNP  At last visit with clinical pharmacist on 07/15/24, patient reported not checking BG at home. She endorsed c/o GI upset and requested to discontinue metformin  at that time. Given cost of Trulicity  on insurance, patient was enrolled in PAP for Ozempic . In the interim, she was started on Lantus  10 units daily to help with glycemic control while awaiting enrollment to begin. Metformin  was discontinued at that time.   In the interim, she received Ozempic  from the company and had her first dose on 08/03/24. She had accidentally thrown out her insulin  therapy on Sunday and was unable to get it filled on insurance as it was provided as a 3 month supply at the pharmacy.   Currently in the process of enrolling in PAP for Tresiba  via [employer].  Today, patient denies any SDE with Ozempic  therapy, such as upset stomach or nausea. Reports GI distress resolved since metformin  was discontinued. She reports that BG has significantly improved from fasting in the 300-400s to now in the upper 100-200s while on insulin  therapy.   Current diabetes medications include: Ozempic  0.25 mg weekly, glipizide  5 mg BID AC, Lantus  10 units daily (not taking) Previous diabetes medications include: Mounjaro  (cost), Trulicity  (cost), Januvia  (hairloss), metformin  (GI upset) Current hypertension medications include: amlodipine  10 mg daily, valsartan /hydrochlorothiazide  320/25 mg daily Current hyperlipidemia medications include: rosuvastatin  40 mg daily, ezetimibe  10 mg daily  Patient reports adherence to taking all medications as prescribed.   Have you been  experiencing any side effects to the medications prescribed? No Do you have any problems obtaining medications due to transportation or finances? yes Insurance coverage: [employer] Medicare  Reported home fasting blood sugars:   7 day average: 238  14 day average: 270  30 day average: 292  Patient denies nocturia (nighttime urination).  Patient reports neuropathy (nerve pain). Patient denies visual changes.  Patient reports self foot exams.   Patient reported dietary habits: Eats 2 meals/day  Breakfast: cereal, sandwich Lunch/Dinner: protein, vegetables Drinks: water, rare soda/sweet tea *reports weakness of sweets (cakes) about 2-3x per week  Patient-reported exercise habits: walks 2 times a week in her neighborhood  Hypertension: Reported home blood pressure readings readings: 130-140/60  Patient reports hypotensive s/sx including dizziness, lightheadedness.  Patient denies hypertensive symptoms including headache, chest pain, shortness of breath   DM Prevention:  Statin: Taking; high intensity.?  History of albuminuria? no, last UACR on 08/28/23 = 21 mg/g Last eye exam: 03/10/24; cataract surgery in November Lab Results  Component Value Date   HMDIABEYEEXA  03/10/2024     Comment:     UNABLE TO DETERMINE   Last foot exam: No foot exam found Tobacco Use:   Tobacco Use: Low Risk  (06/02/2024)   Patient History    Smoking Tobacco Use: Never    Smokeless Tobacco Use: Never    Passive Exposure: Not on file   O:   Vitals:  Wt Readings from Last 3 Encounters:  06/02/24 235 lb (106.6 kg)  03/03/24 243 lb 14.4 oz (110.6 kg)  12/03/23 248 lb (112.5 kg)   BP Readings from Last 3 Encounters:  08/12/24 111/76  06/02/24 (!) 148/88  03/03/24 135/69   Pulse Readings from Last 3 Encounters:  08/12/24 81  06/02/24 81  03/03/24 87     Labs:?  Lab Results  Component Value Date   HGBA1C 10.4 (A) 06/02/2024   HGBA1C 11.1 (H) 03/03/2024   HGBA1C 8.8 (H) 12/03/2023   GLUCOSE  433 (H) 03/03/2024   MICRALBCREAT 21 08/28/2023   MICRALBCREAT 6 02/18/2023   CREATININE 1.00 03/03/2024   CREATININE 0.71 12/03/2023   CREATININE 0.78 08/28/2023    Lab Results  Component Value Date   CHOL 233 (H) 03/03/2024   LDLCALC 138 (H) 03/03/2024   LDLCALC 65 12/03/2023   LDLCALC 131 (H) 08/28/2023   HDL 38 (L) 03/03/2024   TRIG 313 (H) 03/03/2024   TRIG 265 (H) 12/03/2023   TRIG 214 (H) 08/28/2023   ALT 34 (H) 03/03/2024   ALT 25 12/03/2023   AST 50 (H) 03/03/2024   AST 24 12/03/2023      Chemistry      Component Value Date/Time   NA 137 03/03/2024 1347   NA 138 09/23/2012 0547   K 3.8 03/03/2024 1347   K 4.1 09/23/2012 0547   CL 96 03/03/2024 1347   CL 102 09/23/2012 0547   CO2 23 03/03/2024 1347   CO2 27 09/23/2012 0547   BUN 11 03/03/2024 1347   BUN 14 09/23/2012 0547   CREATININE 1.00 03/03/2024 1347   CREATININE 0.92 09/23/2012 0547      Component Value Date/Time   CALCIUM  9.2 03/03/2024 1347   CALCIUM  8.8 09/23/2012 0547   ALKPHOS 77 03/03/2024 1347   AST 50 (H) 03/03/2024 1347   ALT 34 (H) 03/03/2024 1347   BILITOT 0.2 03/03/2024 1347       The 10-year ASCVD risk score (Arnett DK, et al., 2019) is: 18.5%  Lab Results  Component Value Date   MICRALBCREAT 21 08/28/2023   MICRALBCREAT 6 02/18/2023    A/P: Diabetes currently uncontrolled with a most recent A1c of 10.4% on 06/02/24, which is down from 11.1% on 03/03/24. Medication adherence appears appropriate, aside from accidentally discarding insulin  therapy. BG has improved with a 7 day average of 238 mg/dL fasting. Tolerating Ozempic  without issue. Has been off insulin  for ~5 days. Will provide sample of insulin  today and enroll in PAP. Will also continue to titrate Ozempic  after she has completed her first 4 weeks. Given risk of hypoglycemia with concurrent insulin  and SFU use, will discontinue glipizide  at this time.  -Switched from basal insulin  Lantus  (insulin  glargine) to Tresiba  (insulin   degludec) 10 units daily via PAP. In the interim, provided samples of Toujeo  (insulin  glargine) at 10 units daily -Increased dose of GLP-1 Ozempic  (semaglutide ) to 0.5 mg weekly in two weeks on 08/31/24 -Discontinued glipizide  -Patient educated on purpose, proper use, and potential adverse effects of Ozempic  -Extensively discussed pathophysiology of diabetes, recommended lifestyle interventions, dietary effects on blood sugar control.  -Next A1c anticipated 08/2024.   ASCVD risk - primary prevention in patient with diabetes. Last LDL is 138 mg/dL, not at goal of <29 mg/dL. high intensity statin indicated.  -Continued rosuvastatin  40 mg daily.  -Continued ezetimibe  10 mg daily  Written patient instructions provided. Patient verbalized understanding of treatment plan.  Total time in face to face counseling 30 minutes.     Follow-up:  Pharmacist on 09/16/24 PCP clinic visit on 09/02/24  [name], PharmD Clinical Pharmacist [facility] [phone]

## 2024-08-12 NOTE — Patient Instructions (Addendum)
 Thank you for meeting with me today!  Stop glipizide   Continue Ozempic  0.25 mg weekly for 2 more weeks, then increase to 0.5 mg weekly on October 7th, 2025 Continue Toujeo  10 units daily.

## 2024-08-17 NOTE — Telephone Encounter (Signed)
 Received provider portion [employer] [place]) today, faxed to [employer] will follow up in a couple of day.

## 2024-08-19 NOTE — Telephone Encounter (Signed)
 Gave [employer] a call to follow up on pt PAP (TRESIBA ), spoke with representative at [employer] said provider portion is missing signature and need to re-due pap to refill reorder form,faxed to provider office to sign and date.

## 2024-08-20 NOTE — Telephone Encounter (Signed)
 Received provider refill reorder portion [employer] for Tresiba  added pen needles to the order and faxed to [employer] today

## 2024-08-24 NOTE — Telephone Encounter (Signed)
 Received approval letter from [employer] on Tresiba  thru 11/24/2024.left a HIPAA VM. Approval letter was index.

## 2024-08-27 ENCOUNTER — Telehealth: Payer: Self-pay

## 2024-08-27 NOTE — Progress Notes (Signed)
 Contacted patient to discuss Novo Nordisk patient assistance changes for 2026. Patient requested to call back at a later time.  Julia Robertson, PharmD Clinical Pharmacist [facility] [phone]

## 2024-09-02 ENCOUNTER — Ambulatory Visit: Admitting: Family Medicine

## 2024-09-02 ENCOUNTER — Other Ambulatory Visit: Payer: Self-pay

## 2024-09-02 ENCOUNTER — Encounter: Payer: Self-pay | Admitting: Family Medicine

## 2024-09-02 VITALS — BP 112/71 | HR 79 | Resp 16 | Ht 64.0 in | Wt 232.5 lb

## 2024-09-02 DIAGNOSIS — E785 Hyperlipidemia, unspecified: Secondary | ICD-10-CM

## 2024-09-02 DIAGNOSIS — E1340 Other specified diabetes mellitus with diabetic neuropathy, unspecified: Secondary | ICD-10-CM

## 2024-09-02 DIAGNOSIS — I152 Hypertension secondary to endocrine disorders: Secondary | ICD-10-CM | POA: Diagnosis not present

## 2024-09-02 DIAGNOSIS — G4700 Insomnia, unspecified: Secondary | ICD-10-CM

## 2024-09-02 DIAGNOSIS — Z23 Encounter for immunization: Secondary | ICD-10-CM | POA: Diagnosis not present

## 2024-09-02 DIAGNOSIS — E1159 Type 2 diabetes mellitus with other circulatory complications: Secondary | ICD-10-CM | POA: Diagnosis not present

## 2024-09-02 DIAGNOSIS — E1169 Type 2 diabetes mellitus with other specified complication: Secondary | ICD-10-CM

## 2024-09-02 DIAGNOSIS — Z794 Long term (current) use of insulin: Secondary | ICD-10-CM | POA: Diagnosis not present

## 2024-09-02 LAB — POCT GLYCOSYLATED HEMOGLOBIN (HGB A1C)
Est. average glucose Bld gHb Est-mCnc: 220
Hemoglobin A1C: 9.3 % — AB (ref 4.0–5.6)

## 2024-09-02 MED ORDER — AMLODIPINE BESYLATE 10 MG PO TABS
10.0000 mg | ORAL_TABLET | Freq: Every day | ORAL | 1 refills | Status: AC
Start: 2024-09-02 — End: ?
  Filled 2024-09-02: qty 90, 90d supply, fill #0

## 2024-09-02 MED ORDER — GABAPENTIN 800 MG PO TABS
1600.0000 mg | ORAL_TABLET | Freq: Every day | ORAL | 1 refills | Status: DC
  Filled 2024-09-02: qty 180, 90d supply, fill #0
  Filled 2024-11-05: qty 60, 30d supply, fill #0
  Filled 2024-11-05: qty 180, 90d supply, fill #0
  Filled 2024-11-05: qty 120, 60d supply, fill #0

## 2024-09-02 MED ORDER — VALSARTAN-HYDROCHLOROTHIAZIDE 320-25 MG PO TABS
1.0000 | ORAL_TABLET | Freq: Every day | ORAL | 3 refills | Status: AC
  Filled 2024-09-02: qty 90, 90d supply, fill #0

## 2024-09-02 MED ORDER — EZETIMIBE 10 MG PO TABS
10.0000 mg | ORAL_TABLET | Freq: Every day | ORAL | 3 refills | Status: AC
  Filled 2024-09-02: qty 90, 90d supply, fill #0

## 2024-09-02 MED ORDER — TRAZODONE HCL 100 MG PO TABS
100.0000 mg | ORAL_TABLET | Freq: Every day | ORAL | 1 refills | Status: AC
  Filled 2024-09-02 – 2024-10-20 (×2): qty 90, 90d supply, fill #0

## 2024-09-02 MED ORDER — ROSUVASTATIN CALCIUM 40 MG PO TABS
40.0000 mg | ORAL_TABLET | Freq: Every day | ORAL | 3 refills | Status: AC
Start: 2024-09-02 — End: ?
  Filled 2024-09-02: qty 90, 90d supply, fill #0

## 2024-09-02 NOTE — Patient Instructions (Addendum)
 Please contact [phone] to schedule your mammogram. You will be asked your location preference to have procedure performed. You have two options listed below.  1) [facility] located at [address] [place], [place] [address] 2) [facility] located at [address] [place], [place] [address]  Upon results being received our office will contact you. As well as all results can be viewed through your MyChart. Please feel free to contact us  if you have any further questions or concerns.     Hello Julia Robertson,   We wanted to let you know about an important upcoming change that may impact one of your prescribed medications. [employer] Nord[employer], the maker of Ozempic  and Rybelsus , has announced that they will no longer offer these medications to Medicare beneficiaries through their Patient Assistance Program in 2026.   This means that if you currently receive Ozempic  or Rybelsus  at no cost through the program, starting in January 2026 you'll need to use your insurance to continue on these medicines.   Choosing a Medicare Plan  With Medicare's Annual Enrollment Period starting on October 15th, we encourage you to review formulary coverage and copay amounts for Ozempic  or Rybelsus , as costs can vary widely between plans. Starting on Wednesday, October 1st you can compare your Medicare plan options by going to the Plan Finder tool at [employer].gov ( TeleconferenceOnDemand.fr ).   There are Medicare Specialists through the [place] Health Insurance Information Program ([place]) that can help you shop for Medicare plans.   [place] [facility] toll free number: [phone] (Monday - Friday 8 am - 5 pm)  There are representatives located in each county:   [place] [facility] [address]     [place]  [address] [phone] Call for an appointment      [facility] [address]      [place]  [address] [phone]    Call for an appointment    [facility] [address]     [place]  [address] [phone]    Call for an appointment    [facility] [address]     [place]  [address] [phone]    [phone] Monday - Thursday 8:30 am - 4:00 pm, no appointment necessary   [facility]     [address] [place]  [place]  [address] [phone]     Call for an appointment    Maximum Out-of-Pocket and Prescription Payment Plan  In 2026, the maximum yearly out-of-pocket cost for medications is $2,100 for all Medicare beneficiaries. This means you will not have to pay more than $2,100 in total for all prescription medications filled on your Medicare plan in 2026. You may also choose to enroll in a Medicare Prescription Payment Plan through your chosen 2026 Medicare plan. This lets you spread out your prescription drug costs over the year instead of laying a large amount all at once at the pharmacy.   Medicare Extra Help  Some patients may qualify for a program called Medicare Extra Help that could help lower the cost of prescriptions on your chosen Medicare plan. If you meet the below income criteria, please visit [email or web address] to apply, or respond to this message and one of our pharmacy team members can call you to help apply.    Your Household Size Annual income limit Resource  Limit*  Individual $23,475 $17,600  Married Couple [phone] $35,130   *Resources include:   -  Money in a checking, savings, or retirement account   -  Stocks   -  Bonds  Medicare Does NOT consider the following in your resource limit:   -  Your home   -  One car   -  Burial plot   -  Up to $1,500 for burial expenses if you have put that money aside   -  Furniture    We are  here to support you through this transition. Please reach out with questions!  [facility] Team

## 2024-09-02 NOTE — Progress Notes (Signed)
 Established Patient Office Visit  Introduced to nurse practitioner role and practice setting.  All questions answered.  Discussed provider/patient relationship and expectations.  Subjective   Patient ID: Julia Robertson, female    DOB: 12-26-1955  Age: 68 y.o. MRN: [id number]  Chief Complaint  Patient presents with   Medical Management of Chronic Issues    Discussed the use of AI scribe software for clinical note transcription with the patient, who gave verbal consent to proceed.  History of Present Illness Julia Robertson Julia Robertson is a 68 year old female with diabetes who presents for diabetes management.  Her HbA1c levels have decreased over time, currently at 9.3%, down from 10.4% and 11.1% previously. She is on Tresiba  10 units daily and Ozempic  0.25mg  weekly, with no recent adjustments to her medications.She is working with [facility] pharmacist monthly to improve your diabetic control.  She is currently using trazodone  for sleep and may need a refill. She prefers all her medications to be sent to [facility] for convenience.  She is scheduled for cataract surgery on November 3rd and needs to ensure she receives a diabetic eye screening. Her last screening was in 2023 per records. She has not yet scheduled her mammogram but has the order in place.  She experiences neuropathy and inquires about suitable footwear. Her foot causes her shoes to wear unevenly, leading to discomfort. She has previously seen a podiatrist but it has been a while since her last visit.      09/02/2024    1:43 PM 06/02/2024    4:10 PM 03/03/2024    1:18 PM  Depression screen PHQ 2/9  Decreased Interest 0 0 0  Down, Depressed, Hopeless 0 0 0  PHQ - 2 Score 0 0 0  Altered sleeping  0 0  Tired, decreased energy  0 0  Change in appetite  0 0  Feeling bad or failure about yourself   0 0  Trouble concentrating  0 0  Moving slowly or fidgety/restless  0 0  Suicidal thoughts  0 0  PHQ-9 Score  0 0   Difficult doing work/chores  Not difficult at all Not difficult at all       09/02/2024    1:43 PM 06/02/2024    4:10 PM 03/03/2024    1:18 PM  GAD 7 : Generalized Anxiety Score  Nervous, Anxious, on Edge 0 0 0  Control/stop worrying 0 0 0  Worry too much - different things 0 0 0  Trouble relaxing 0 0 0  Restless 0 0 0  Easily annoyed or irritable 0 0 0  Afraid - awful might happen 0 0 0  Total GAD 7 Score 0 0 0  Anxiety Difficulty Not difficult at all Not difficult at all Not difficult at all     ROS  Negative unless indicated in HPI   Objective:     BP 112/71 (BP Location: Left Arm, Patient Position: Sitting, Cuff Size: Large)   Pulse 79   Resp 16   Ht 5' 4 (1.626 m)   Wt 232 lb 8 oz (105.5 kg)   SpO2 100%   BMI 39.91 kg/m    Physical Exam Constitutional:      General: She is not in acute distress.    Appearance: Normal appearance. She is obese. She is not ill-appearing, toxic-appearing or diaphoretic.  HENT:     Head: Normocephalic.  Eyes:     Extraocular Movements: Extraocular movements intact.  Pupils: Pupils are equal, round, and reactive to light.  Neck:     Vascular: No carotid bruit.  Cardiovascular:     Rate and Rhythm: Normal rate and regular rhythm.     Pulses: Normal pulses.     Heart sounds: Normal heart sounds. No murmur heard.    No friction rub. No gallop.  Pulmonary:     Effort: No respiratory distress.     Breath sounds: No stridor. No wheezing, rhonchi or rales.  Chest:     Chest wall: No tenderness.  Musculoskeletal:     Right lower leg: No edema.     Left lower leg: No edema.  Lymphadenopathy:     Cervical: No cervical adenopathy.  Skin:    General: Skin is warm and dry.     Capillary Refill: Capillary refill takes less than 2 seconds.  Neurological:     General: No focal deficit present.     Mental Status: She is alert and oriented to person, place, and time. Mental status is at baseline.     Cranial Nerves: No cranial nerve  deficit.     Motor: No weakness.     Gait: Gait normal.  Psychiatric:        Mood and Affect: Mood normal.        Behavior: Behavior normal.        Thought Content: Thought content normal.        Judgment: Judgment normal.      Results for orders placed or performed in visit on 09/02/24  POCT glycosylated hemoglobin (Hb A1C)  Result Value Ref Range   Hemoglobin A1C 9.3 (A) 4.0 - 5.6 %   Est. average glucose Bld gHb Est-mCnc 220       The 10-year ASCVD risk score (Arnett DK, et al., 2019) is: 18.9%    Assessment & Plan:  Type 2 diabetes mellitus with other specified complication, without long-term current use of insulin  (HCC) -     POCT glycosylated hemoglobin (Hb A1C) -     Microalbumin / creatinine urine ratio -     Ambulatory referral to Podiatry  Hypertension associated with diabetes (HCC) -     amLODIPine  Besylate; Take 1 tablet (10 mg total) by mouth daily.  Dispense: 90 tablet; Refill: 1 -     Valsartan -hydroCHLOROthiazide ; Take 1 tablet by mouth daily.  Dispense: 90 tablet; Refill: 3  Hyperlipidemia associated with type 2 diabetes mellitus (HCC) -     Rosuvastatin  Calcium ; Take 1 tablet (40 mg total) by mouth daily.  Dispense: 90 tablet; Refill: 3 -     Ezetimibe ; Take 1 tablet (10 mg total) by mouth daily.  Dispense: 90 tablet; Refill: 3  Neuropathy due to secondary diabetes (HCC) -     Gabapentin ; Take 2 tablets (1,600 mg total) by mouth at bedtime.  Dispense: 180 tablet; Refill: 1  Insomnia, unspecified type -     traZODone  HCl; Take 1 tablet (100 mg total) by mouth at bedtime.  Dispense: 90 tablet; Refill: 1  Immunization due -     Flu vaccine HIGH DOSE PF(Fluzone Trivalent)     Assessment and Plan Assessment & Plan  Type 2 diabetes mellitus w/ long standing insulin  - A1c from 10.4 to 9.3% today with POC check - order urine ACR - Neuropathy affecting shoe wear and comfort. - Continue current diabetes medications: Tresiba  10 units daily and Ozempic   0.25mg  weekly - Reports sugars more in high 100s -200s, where were in 300-400s before -  Working closely with [facility] pharmacy on improving DMII mgmt - continue fasting blood glucose checks (goal 80 to 130) - Appears had eye screening April 2025 - unable to interpret provider's writing on document, pt is good at her eye care and evaluation regardless, keeps up with regular visits. - UTD on foot exam - Refer to podiatry for evaluation and recommendations for footwear suitable for neuropathy.  Hypertension Chronic, well controlled - continue valsartan -hydrochlorothiazide  320/25mg  daily - Continue amlodipine  10mg  daily GOAL<130/80 CMP UTD  Hyperlipidemia - continue Zetia  10mg  daily - Continue Crestor  40mg  daily - UTD lipid panel, controlled  Chronic neuropathic pain - continue gabapentin  1600mg  nightly - recommend repeating lipid panel in 3 months  Insomnia - Refill trazodone  100mg  nightly  Administer flu vaccine today   Return in about 3 months (around 12/03/2024) for DMI/ HTN & A1C check.   I, Curtis DELENA Boom, FNP, have reviewed all documentation for this visit. The documentation on 09/02/24 for the exam, diagnosis, procedures, and orders are all accurate and complete.   Curtis DELENA Boom, FNP

## 2024-09-02 NOTE — Progress Notes (Signed)
 Julia Robertson                                          MRN: [id number]   09/02/2024   The [facility] Quality Team Specialist reviewed this patient medical record for the purposes of chart review for care gap closure. The following were reviewed: chart review for care gap closure-glycemic status assessment.    VBCI Quality Team

## 2024-09-02 NOTE — Progress Notes (Signed)
 Spoke with Ms. Elnor regarding Novo Nordisk changes for the 2026 year. Explained that Ozempic  will no longer be offered via patient assistance for Medicare Beneficiaries.   It appears that she is not eligible for Medicare Extra Help based on her income.   Discussed options such as meeting with Medicare Specialists during open enrollment or utilizing the Medicare payment plan with a max out of pocket of $2100 for the new year.  Will follow up with clinical pharmacist on 09/16/24.  Chere Babson E. Marsh, PharmD Clinical Pharmacist [facility] [phone]

## 2024-09-03 ENCOUNTER — Ambulatory Visit: Payer: Self-pay | Admitting: Family Medicine

## 2024-09-03 LAB — MICROALBUMIN / CREATININE URINE RATIO
Creatinine, Urine: 125 mg/dL
Microalb/Creat Ratio: 9 mg/g{creat} (ref 0–29)
Microalbumin, Urine: 10.7 ug/mL

## 2024-09-06 NOTE — Progress Notes (Signed)
 Julia Robertson                                          MRN: [id number]   09/06/2024   The VBCI Quality Team Specialist reviewed this patient medical record for the purposes of chart review for care gap closure. The following were reviewed: abstraction for care gap closure-kidney health evaluation for diabetes:eGFR  and uACR.    VBCI Quality Team

## 2024-09-08 ENCOUNTER — Encounter: Payer: Self-pay | Admitting: Family Medicine

## 2024-09-08 ENCOUNTER — Other Ambulatory Visit: Payer: Self-pay

## 2024-09-08 ENCOUNTER — Ambulatory Visit (INDEPENDENT_AMBULATORY_CARE_PROVIDER_SITE_OTHER): Admitting: Family Medicine

## 2024-09-08 VITALS — BP 91/61 | HR 76 | Temp 98.4°F | Ht 64.0 in | Wt 234.6 lb

## 2024-09-08 DIAGNOSIS — M25552 Pain in left hip: Secondary | ICD-10-CM

## 2024-09-08 DIAGNOSIS — M1612 Unilateral primary osteoarthritis, left hip: Secondary | ICD-10-CM | POA: Diagnosis not present

## 2024-09-08 MED ORDER — KETOROLAC TROMETHAMINE 30 MG/ML IJ SOLN: 30.0000 mg | Freq: Once | INTRAMUSCULAR | Status: DC

## 2024-09-08 MED ORDER — KETOROLAC TROMETHAMINE 60 MG/2ML IM SOLN
60.0000 mg | Freq: Once | INTRAMUSCULAR | Status: AC
  Administered 2024-09-08: 30 mg via INTRAMUSCULAR

## 2024-09-08 MED ORDER — MELOXICAM 7.5 MG PO TABS
7.5000 mg | ORAL_TABLET | Freq: Every day | ORAL | 0 refills | Status: AC
Start: 2024-09-09 — End: ?
  Filled 2024-09-08: qty 30, 30d supply, fill #0

## 2024-09-08 NOTE — Patient Instructions (Signed)
  Please report to [facility] located at:  [address]  [place], [place] [address]  You do not need an appointment to have xrays completed.   Our office will follow up with  results once available.     To keep you healthy, please keep in mind the following health maintenance items that you are due for:   Health Maintenance Due  Topic Date Due   Medicare Annual Wellness (AWV)  Never done   OPHTHALMOLOGY EXAM  10/15/2022   Cervical Cancer Screening (HPV/Pap Cotest)  10/01/2023   DEXA SCAN  Never done   COVID-19 Vaccine (5 - 2025-26 season) 07/26/2024     Best Wishes,   Dr. Lang

## 2024-09-08 NOTE — Progress Notes (Signed)
 ACUTE VISIT   Patient: Julia Robertson   DOB: Oct 03, 1956   68 y.o. Female  MRN: [id number]   PCP: Wellington Curtis LABOR, FNP  Chief Complaint  Patient presents with   Acute Visit   Hip Pain    Patient reports pain from L side groin all the way to side of left hip onset x 3-4 days. Pain with walking/ weight bearing, relief when sitting down   Subjective    HPI HPI     Hip Pain    Additional comments: Patient reports pain from L side groin all the way to side of left hip onset x 3-4 days. Pain with walking/ weight bearing, relief when sitting down      Last edited by Cherry Chiquita CHRISTELLA, CMA on 09/08/2024  3:35 PM.       Discussed the use of AI scribe software for clinical note transcription with the patient, who gave verbal consent to proceed.  History of Present Illness Julia Robertson Julia Robertson is a 68 year old female with obesity, hypertension, and diabetes who presents with acute left-sided groin and hip pain.  She has been experiencing acute left-sided groin pain extending to her left hip for the past three to four days. The pain is exacerbated by walking, bearing weight, and sitting down. It originates from the groin and radiates around to the hip. No recent trauma, falls, or accidents have occurred that could have precipitated the pain.  The pain worsens with certain shoes, although she typically wears Skechers, which she believes provide good support. The pain does not radiate down her leg to her foot, and she denies any shooting pains. She reports having had similar hip pain a long time ago.  The pain has progressively worsened, with Monday being particularly severe, making it difficult for her to walk. She has been managing the pain with over-the-counter medications, including Aleve and eight-hour arthritis pain pills, which have provided some relief. She has not had any surgeries or fractures in the left hip area in the past.     Medications: Outpatient  Medications Prior to Visit  Medication Sig   amLODipine  (NORVASC ) 10 MG tablet Take 1 tablet (10 mg total) by mouth daily.   Blood Glucose Monitoring Suppl DEVI 1 each by Does not apply route in the morning, at noon, and at bedtime. May substitute to any manufacturer covered by patient's insurance.   ezetimibe  (ZETIA ) 10 MG tablet Take 1 tablet (10 mg total) by mouth daily.   gabapentin  (NEURONTIN ) 800 MG tablet Take 2 tablets (1,600 mg total) by mouth at bedtime.   glucose blood test strip Use to check blood sugar up to 5 times daily   ibuprofen (ADVIL) 600 MG tablet Take 600 mg by mouth every 6 (six) hours as needed.   insulin  degludec (TRESIBA ) 100 UNIT/ML FlexTouch Pen Inject 10 Units into the skin daily.   Insulin  Pen Needle 31G X 5 MM MISC Use as directed to inject insulin  once daily   Lancet Device MISC Use to check blood sugar up to 5 times per day. May substitute to any manufacturer covered by patient's insurance.   Multiple Vitamin (MULTIVITAMIN) capsule Take 1 capsule by mouth daily.   rosuvastatin  (CRESTOR ) 40 MG tablet Take 1 tablet (40 mg total) by mouth daily.   Semaglutide ,0.25 or 0.5MG /DOS, (OZEMPIC , 0.25 OR 0.5 MG/DOSE,) 2 MG/3ML SOPN Increase to 0.5 mg weekly on 08/31/24   traZODone  (DESYREL ) 100 MG tablet Take 1 tablet (  100 mg total) by mouth at bedtime.   valsartan -hydrochlorothiazide  (DIOVAN -HCT) 320-25 MG tablet Take 1 tablet by mouth daily.   No facility-administered medications prior to visit.    Last metabolic panel Lab Results  Component Value Date   GLUCOSE 433 (H) 03/03/2024   NA 137 03/03/2024   K 3.8 03/03/2024   CL 96 03/03/2024   CO2 23 03/03/2024   BUN 11 03/03/2024   CREATININE 1.00 03/03/2024   EGFR 63 03/03/2024   CALCIUM  9.2 03/03/2024   PROT 7.5 03/03/2024   ALBUMIN 4.2 03/03/2024   LABGLOB 3.3 03/03/2024   AGRATIO 1.1 (L) 02/18/2023   BILITOT 0.2 03/03/2024   ALKPHOS 77 03/03/2024   AST 50 (H) 03/03/2024   ALT 34 (H) 03/03/2024   ANIONGAP  9 09/23/2012        Objective    BP 91/61 (BP Location: Right Arm, Patient Position: Sitting, Cuff Size: Normal)   Pulse 76   Temp 98.4 F (36.9 C) (Oral)   Ht 5' 4 (1.626 m)   Wt 234 lb 9.6 oz (106.4 kg)   SpO2 99%   BMI 40.27 kg/m    Physical Exam Musculoskeletal:     Right hip: Normal.     Left hip: Tenderness present. No bony tenderness. Normal strength.     Comments: Negative log roll tests bilaterally  Pain with flexion of the left hip  Ambulating independently  Negative straight leg raise tests       Physical Exam    No results found for any visits on 09/08/24.  Assessment & Plan      Assessment & Plan Left hip and groin pain Acute left hip and groin pain for 3-4 days, exacerbated by weight-bearing and certain footwear. Pain is deep, possibly related to arthritis or a labral tear. Sciatica is less likely due to the absence of radiating pain down the leg. No trauma or previous surgeries to the left hip. Pain worsened significantly on Monday, limiting mobility. Differential includes arthritis and labral tear, with bursitis being less likely. - Administer 30 mg Toradol injection for pain relief. - Order x-ray of the left hip to evaluate for arthritis or bone damage. - Prescribe meloxicam  7.5 mg once daily, starting the day after the Toradol injection. Advise against taking additional NSAIDs concurrently. - Advise to use Tylenol for additional pain relief if needed, but avoid NSAIDs. - Discussed the option of orthopedic referral for further evaluation and management, but she prefers to wait for x-ray results first.      Return in about 1 month (around 10/09/2024) for left hip arthritis .        Rockie Agent, MD  [facility] [phone] (phone) [phone] (fax)  [facility]

## 2024-09-09 ENCOUNTER — Ambulatory Visit
Admission: RE | Admit: 2024-09-09 | Discharge: 2024-09-09 | Disposition: A | Discharge status: Home / Self Care | Attending: Family Medicine | Admitting: Family Medicine

## 2024-09-09 ENCOUNTER — Ambulatory Visit
Admission: RE | Admit: 2024-09-09 | Discharge: 2024-09-09 | Disposition: A | Discharge status: Home / Self Care | Source: Ambulatory Visit | Attending: Family Medicine | Admitting: Family Medicine

## 2024-09-09 DIAGNOSIS — M1612 Unilateral primary osteoarthritis, left hip: Secondary | ICD-10-CM | POA: Diagnosis not present

## 2024-09-09 DIAGNOSIS — M160 Bilateral primary osteoarthritis of hip: Secondary | ICD-10-CM | POA: Diagnosis not present

## 2024-09-09 DIAGNOSIS — M25552 Pain in left hip: Secondary | ICD-10-CM | POA: Diagnosis not present

## 2024-09-09 DIAGNOSIS — M47816 Spondylosis without myelopathy or radiculopathy, lumbar region: Secondary | ICD-10-CM | POA: Diagnosis not present

## 2024-09-09 NOTE — Progress Notes (Signed)
 Pharmacy Quality Measure Review  This patient is appearing on a report for being at risk of failing the adherence measure for cholesterol (statin) medications this calendar year.   Medication: rosuvastatin  Last fill date: 09/08/24 for 90 day supply  Insurance report was not up to date. No action needed at this time.   Jalaysha Skilton E. Marsh, PharmD Clinical Pharmacist [facility] [phone]

## 2024-09-11 ENCOUNTER — Other Ambulatory Visit: Payer: Self-pay

## 2024-09-14 ENCOUNTER — Ambulatory Visit (INDEPENDENT_AMBULATORY_CARE_PROVIDER_SITE_OTHER)

## 2024-09-14 ENCOUNTER — Encounter: Payer: Self-pay | Admitting: Podiatry

## 2024-09-14 ENCOUNTER — Ambulatory Visit: Admitting: Podiatry

## 2024-09-14 VITALS — Ht 64.0 in | Wt 234.6 lb

## 2024-09-14 DIAGNOSIS — M79674 Pain in right toe(s): Secondary | ICD-10-CM

## 2024-09-14 DIAGNOSIS — M79675 Pain in left toe(s): Secondary | ICD-10-CM | POA: Diagnosis not present

## 2024-09-14 DIAGNOSIS — M19071 Primary osteoarthritis, right ankle and foot: Secondary | ICD-10-CM | POA: Diagnosis not present

## 2024-09-14 DIAGNOSIS — M7751 Other enthesopathy of right foot: Secondary | ICD-10-CM

## 2024-09-14 DIAGNOSIS — B351 Tinea unguium: Secondary | ICD-10-CM | POA: Diagnosis not present

## 2024-09-14 NOTE — Progress Notes (Signed)
   Chief Complaint  Patient presents with   Foot Pain    Pt is here due to right foot issue, states her foot turns outward while wearing shoes, states no pain, just concerned.    Subjective:  68 y.o. female presenting today for follow-up evaluation regarding pain and tenderness to the bilateral feet secondary to flatfoot deformity.  She says that is tolerable with Tylenol  Also requesting nail debridement today  Past Medical History:  Diagnosis Date   Diabetes mellitus without complication (HCC)    Hyperlipemia    Hypertension    Neuromuscular disorder (HCC)    Obesity    Past Surgical History:  Procedure Laterality Date   CATARACT EXTRACTION W/PHACO Left 10/09/2022   Procedure: CATARACT EXTRACTION PHACO AND INTRAOCULAR LENS PLACEMENT (IOC) LEFT DIABETIC  2.09  00:22.4;  Surgeon: Mittie Gaskin, MD;  [facility];  Service: Ophthalmology;  Laterality: Left;  Diabetic   CHOLECYSTECTOMY     COLONOSCOPY WITH PROPOFOL  N/A 11/28/2015   Procedure: COLONOSCOPY WITH PROPOFOL ;  Surgeon: Rogelia Copping, MD;  Location: [facility];  Service: Endoscopy;  Laterality: N/A;   COLONOSCOPY WITH PROPOFOL  N/A 02/27/2021   Procedure: COLONOSCOPY WITH PROPOFOL ;  Surgeon: Copping Rogelia, MD;  Location: [facility];  Service: Endoscopy;  Laterality: N/A;   REPLACEMENT TOTAL KNEE Right 08/2012   Dr. Kathlynn at [facility]   Allergies  Allergen Reactions   Mucinex [Guaifenesin Er] Other (See Comments)    Syncope (dropped BP)       Objective/Physical Exam General: The patient is alert and oriented x3 in no acute distress.  Dermatology: Skin is warm, dry and supple bilateral lower extremities.  Hyperkeratotic dystrophic nails noted 1-5 bilateral  Vascular: Palpable pedal pulses bilaterally. No edema or erythema noted. Capillary refill within normal limits.  Neurological: Grossly intact via light touch  Musculoskeletal Exam: Range of motion within normal limits to all pedal and ankle  joints bilateral. Muscle strength 5/5 in all groups bilateral.  Upon weightbearing there is a medial longitudinal arch collapse bilaterally. Remove foot valgus noted to the bilateral lower extremities with excessive pronation upon mid stance.  Radiographic exam RT foot 09/14/2024: Advanced degenerative arthritis noted throughout the midtarsal joints of the foot with para-articular spurring.  No acute fractures identified.  Assessment: 1.  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  Pain due to onychomycosis of toenails both   Plan of Care:  -Patient evaluated.  X-rays reviewed -Declined cortisone injection -Also discussed surgical correction of the flatfoot to address both the arthritis and the flatfoot however the patient states that currently she is not in a position for surgery and the pain is tolerable with Tylenol -Continue wearing good supportive shoes and sneakers with arch supports -Mechanical debridement of nails 1-5 bilateral performed using nail nipper without incident or bleeding -Return to clinic PRN   Thresa EMERSON Sar, DPM [facility]  Dr. Thresa EMERSON Sar, DPM    [address]                                        [place], [place] [address]                Office [phone]  Fax [phone]

## 2024-09-16 ENCOUNTER — Telehealth: Payer: Self-pay

## 2024-09-16 ENCOUNTER — Ambulatory Visit (INDEPENDENT_AMBULATORY_CARE_PROVIDER_SITE_OTHER)

## 2024-09-16 DIAGNOSIS — Z794 Long term (current) use of insulin: Secondary | ICD-10-CM

## 2024-09-16 DIAGNOSIS — E1169 Type 2 diabetes mellitus with other specified complication: Secondary | ICD-10-CM | POA: Diagnosis not present

## 2024-09-16 DIAGNOSIS — Z7985 Long-term (current) use of injectable non-insulin antidiabetic drugs: Secondary | ICD-10-CM

## 2024-09-16 MED ORDER — SEMAGLUTIDE (1 MG/DOSE) 4 MG/3ML ~~LOC~~ SOPN: 1.0000 mg | PEN_INJECTOR | SUBCUTANEOUS | Status: DC

## 2024-09-16 MED ORDER — FREESTYLE LIBRE 3 PLUS SENSOR MISC: Status: AC

## 2024-09-16 NOTE — Telephone Encounter (Signed)
 Filled and faxed refill reorder form provider portion increase to 1 mg Ozempic  for provider to sign and date and faxed back to [phone].

## 2024-09-16 NOTE — Patient Instructions (Addendum)
 Thanks for visiting with me today!  Please increase Ozempic  to 1 mg weekly starting 09/27/24 Continue Lantus  10 units daily  Let me know if you have any questions!

## 2024-09-16 NOTE — Progress Notes (Signed)
 S:     Chief Complaint  Patient presents with   Medication Management    Reason for visit: ?  Julia Robertson is a 68 y.o. female with a history of diabetes (type 2), who presents today for an initial diabetes pharmacotherapy visit.? Pertinent PMH also includes HTN, HLD, insomnia, and obesity.  Care Team: Primary Care Provider: Wellington Curtis LABOR, FNP  At last visit with clinical pharmacist on 08/12/24, glipizide  was discontinued and Ozempic  was increased from 0.25 mg to 0.5 mg weekly.   Today, patient denies any SDE with Ozempic  therapy, such as upset stomach or nausea. Reports fasting BG will be typically in the lower 100s normally, or up to 200 if she is eating sweets the night before. Patient reports that she has been able to find a new Medicare plan for 2026 that will cover her Ozempic  at $35 each month.  Current diabetes medications include: Ozempic  0.5 mg weekly, Lantus  10 units daily  Previous diabetes medications include: Mounjaro  (cost), Trulicity  (cost), Januvia  (hairloss), metformin  (GI upset) Current hypertension medications include: amlodipine  10 mg daily, valsartan /hydrochlorothiazide  320/25 mg daily Current hyperlipidemia medications include: rosuvastatin  40 mg daily, ezetimibe  10 mg daily  Patient reports adherence to taking all medications as prescribed.   Have you been experiencing any side effects to the medications prescribed? No Do you have any problems obtaining medications due to transportation or finances? yes Insurance coverage: [employer] Medicare  Reported home fasting blood sugars:   Did not bring meter today - reports range 120-200 mg/dL  Patient denies nocturia (nighttime urination).  Patient reports neuropathy (nerve pain). Patient denies visual changes.  Patient reports self foot exams.   Patient reported dietary habits: Eats 2 meals/day  Breakfast: cereal, sandwich Lunch/Dinner: protein, vegetables Drinks: water, rare soda/sweet  tea *reports weakness of sweets (cakes) about 2-3x per week  Patient-reported exercise habits: walks 2 times a week in her neighborhood  DM Prevention:  Statin: Taking; high intensity.?  History of albuminuria? no, last UACR on 08/28/23 = 21 mg/g Last eye exam: 03/10/24; cataract surgery in November Lab Results  Component Value Date   HMDIABEYEEXA  03/10/2024     Comment:     UNABLE TO DETERMINE   Last foot exam: No foot exam found Tobacco Use:   Tobacco Use: Low Risk  (09/14/2024)   Patient History    Smoking Tobacco Use: Never    Smokeless Tobacco Use: Never    Passive Exposure: Not on file   O:   Vitals:  Wt Readings from Last 3 Encounters:  09/14/24 234 lb 9.6 oz (106.4 kg)  09/08/24 234 lb 9.6 oz (106.4 kg)  09/02/24 232 lb 8 oz (105.5 kg)   BP Readings from Last 3 Encounters:  09/08/24 91/61  09/02/24 112/71  08/12/24 111/76   Pulse Readings from Last 3 Encounters:  09/08/24 76  09/02/24 79  08/12/24 81     Labs:?  Lab Results  Component Value Date   HGBA1C 9.3 (A) 09/02/2024   HGBA1C 10.4 (A) 06/02/2024   HGBA1C 11.1 (H) 03/03/2024   GLUCOSE 433 (H) 03/03/2024   MICRALBCREAT 9 09/02/2024   MICRALBCREAT 21 08/28/2023   MICRALBCREAT 6 02/18/2023   CREATININE 1.00 03/03/2024   CREATININE 0.71 12/03/2023   CREATININE 0.78 08/28/2023    Lab Results  Component Value Date   CHOL 233 (H) 03/03/2024   LDLCALC 138 (H) 03/03/2024   LDLCALC 65 12/03/2023   LDLCALC 131 (H) 08/28/2023   HDL 38 (L) 03/03/2024  TRIG 313 (H) 03/03/2024   TRIG 265 (H) 12/03/2023   TRIG 214 (H) 08/28/2023   ALT 34 (H) 03/03/2024   ALT 25 12/03/2023   AST 50 (H) 03/03/2024   AST 24 12/03/2023      Chemistry      Component Value Date/Time   NA 137 03/03/2024 1347   NA 138 09/23/2012 0547   K 3.8 03/03/2024 1347   K 4.1 09/23/2012 0547   CL 96 03/03/2024 1347   CL 102 09/23/2012 0547   CO2 23 03/03/2024 1347   CO2 27 09/23/2012 0547   BUN 11 03/03/2024 1347   BUN  14 09/23/2012 0547   CREATININE 1.00 03/03/2024 1347   CREATININE 0.92 09/23/2012 0547      Component Value Date/Time   CALCIUM  9.2 03/03/2024 1347   CALCIUM  8.8 09/23/2012 0547   ALKPHOS 77 03/03/2024 1347   AST 50 (H) 03/03/2024 1347   ALT 34 (H) 03/03/2024 1347   BILITOT 0.2 03/03/2024 1347       The 10-year ASCVD risk score (Arnett DK, et al., 2019) is: 11.8%  Lab Results  Component Value Date   MICRALBCREAT 9 09/02/2024   MICRALBCREAT 21 08/28/2023   MICRALBCREAT 6 02/18/2023    A/P: Diabetes currently uncontrolled with a most recent A1c of 10.4% on 06/02/24, which is down from 11.1% on 03/03/24. Medication adherence appears appropriate. Patient did not bring BG log to clinic but reports a fasting BG range of 120-200 mg/dL. She would benefit from CGM testing supplies for monitoring of BG. Will increase dose of Ozempic  for additional glycemic benefit. Patient reports new insurance for 2026 will cover Ozempic  for $35 a month. -Continued basal insulin  Tresiba  (insulin  degludec) 10 units daily  -Increased dose of GLP-1 Ozempic  (semaglutide ) to 1 mg weekly starting 09/27/24. Will coordinate with PAP team for dose change. -Patient educated on purpose, proper use, and potential adverse effects of Ozempic  -Extensively discussed pathophysiology of diabetes, recommended lifestyle interventions, dietary effects on blood sugar control.  -Next A1c anticipated 08/2024.  -Placed Freestyle Libre 3+ sensors in clinic. Will submit order to DME company  ASCVD risk - primary prevention in patient with diabetes. Last LDL is 138 mg/dL, not at goal of <29 mg/dL. high intensity statin indicated.  -Continued rosuvastatin  40 mg daily.  -Continued ezetimibe  10 mg daily  Written patient instructions provided. Patient verbalized understanding of treatment plan.  Total time in face to face counseling 30 minutes.     Follow-up:  Pharmacist on 10/20/24 PCP clinic visit on 12/03/24  [name],  PharmD Clinical Pharmacist [facility] [phone]

## 2024-09-16 NOTE — Telephone Encounter (Signed)
 Gave pt a call to get pt LIS imf,spoke with pt explain she wont qualify due to her making too much and is a house of 2 and her husband is making to much plus pt has a new ins for 2026 and told her Ozempic  will be cover she will have a copay.

## 2024-09-18 ENCOUNTER — Ambulatory Visit: Payer: Self-pay | Admitting: Family Medicine

## 2024-09-20 NOTE — Telephone Encounter (Signed)
 Received provider portion [employer] Ozempic  refill reorder form faxed to [employer] today.

## 2024-09-22 ENCOUNTER — Other Ambulatory Visit (HOSPITAL_COMMUNITY): Payer: Self-pay

## 2024-09-23 ENCOUNTER — Ambulatory Visit (INDEPENDENT_AMBULATORY_CARE_PROVIDER_SITE_OTHER)

## 2024-09-23 ENCOUNTER — Other Ambulatory Visit: Payer: Self-pay

## 2024-09-23 ENCOUNTER — Telehealth: Payer: Self-pay

## 2024-09-23 DIAGNOSIS — Z7985 Long-term (current) use of injectable non-insulin antidiabetic drugs: Secondary | ICD-10-CM | POA: Diagnosis not present

## 2024-09-23 DIAGNOSIS — E1169 Type 2 diabetes mellitus with other specified complication: Secondary | ICD-10-CM | POA: Diagnosis not present

## 2024-09-23 NOTE — Progress Notes (Deleted)
 S:    Reason for visit: ?  Julia Robertson is a 68 y.o. female with a history of diabetes (type 2), who presents today for a follow up diabetes pharmacotherapy visit.? Pertinent PMH also includes HTN, HLD, insomnia, and obesity.  Care Team: Primary Care Provider: Wellington Curtis LABOR, FNP  Patient presented today for assistance with scanning new CGM sensor into her phone.   Current diabetes medications include: Ozempic  0.5 mg weekly, glipizide  5 mg BID AC, Tresiba  10 units daily  Previous diabetes medications include: Mounjaro  (cost), Trulicity  (cost), Januvia  (hairloss), metformin  (GI upset) Current hypertension medications include: amlodipine  10 mg daily, valsartan /hydrochlorothiazide  320/25 mg daily Current hyperlipidemia medications include: rosuvastatin  40 mg daily, ezetimibe  10 mg daily  Patient reports adherence to taking all medications as prescribed.   Have you been experiencing any side effects to the medications prescribed? No Do you have any problems obtaining medications due to transportation or finances? yes Insurance coverage: [facility] Medicare  Patient reported dietary habits: Eats 2 meals/day  Breakfast: cereal, sandwich Lunch/Dinner: protein, vegetables Drinks: water, rare soda/sweet tea *reports weakness of sweets (cakes) about 2-3x per week  Patient-reported exercise habits: walks 2 times a week in her neighborhood  Hypertension: Reported home blood pressure readings readings: 130-140/60  Patient reports hypotensive s/sx including dizziness, lightheadedness.  Patient denies hypertensive symptoms including headache, chest pain, shortness of breath   DM Prevention:  Statin: Taking; high intensity.?  History of albuminuria? no, last UACR on 08/28/23 = 21 mg/g Last eye exam: 03/10/24; cataract surgery in November Lab Results  Component Value Date   HMDIABEYEEXA  03/10/2024     Comment:     UNABLE TO DETERMINE   Last foot exam: No foot exam found Tobacco  Use:   Tobacco Use: Low Risk  (09/14/2024)   Patient History    Smoking Tobacco Use: Never    Smokeless Tobacco Use: Never    Passive Exposure: Not on file   O:   Vitals:  Wt Readings from Last 3 Encounters:  09/14/24 234 lb 9.6 oz (106.4 kg)  09/08/24 234 lb 9.6 oz (106.4 kg)  09/02/24 232 lb 8 oz (105.5 kg)   BP Readings from Last 3 Encounters:  09/08/24 91/61  09/02/24 112/71  08/12/24 111/76   Pulse Readings from Last 3 Encounters:  09/08/24 76  09/02/24 79  08/12/24 81     Labs:?  Lab Results  Component Value Date   HGBA1C 9.3 (A) 09/02/2024   HGBA1C 10.4 (A) 06/02/2024   HGBA1C 11.1 (H) 03/03/2024   GLUCOSE 433 (H) 03/03/2024   MICRALBCREAT 9 09/02/2024   MICRALBCREAT 21 08/28/2023   MICRALBCREAT 6 02/18/2023   CREATININE 1.00 03/03/2024   CREATININE 0.71 12/03/2023   CREATININE 0.78 08/28/2023    Lab Results  Component Value Date   CHOL 233 (H) 03/03/2024   LDLCALC 138 (H) 03/03/2024   LDLCALC 65 12/03/2023   LDLCALC 131 (H) 08/28/2023   HDL 38 (L) 03/03/2024   TRIG 313 (H) 03/03/2024   TRIG 265 (H) 12/03/2023   TRIG 214 (H) 08/28/2023   ALT 34 (H) 03/03/2024   ALT 25 12/03/2023   AST 50 (H) 03/03/2024   AST 24 12/03/2023      Chemistry      Component Value Date/Time   NA 137 03/03/2024 1347   NA 138 09/23/2012 0547   K 3.8 03/03/2024 1347   K 4.1 09/23/2012 0547   CL 96 03/03/2024 1347   CL 102 09/23/2012 0547  CO2 23 03/03/2024 1347   CO2 27 09/23/2012 0547   BUN 11 03/03/2024 1347   BUN 14 09/23/2012 0547   CREATININE 1.00 03/03/2024 1347   CREATININE 0.92 09/23/2012 0547      Component Value Date/Time   CALCIUM  9.2 03/03/2024 1347   CALCIUM  8.8 09/23/2012 0547   ALKPHOS 77 03/03/2024 1347   AST 50 (H) 03/03/2024 1347   ALT 34 (H) 03/03/2024 1347   BILITOT 0.2 03/03/2024 1347       The 10-year ASCVD risk score (Arnett DK, et al., 2019) is: 11.8%  Lab Results  Component Value Date   MICRALBCREAT 9 09/02/2024    MICRALBCREAT 21 08/28/2023   MICRALBCREAT 6 02/18/2023    A/P: Diabetes currently uncontrolled with a most recent A1c of 10.4% on 06/02/24, which is down from 11.1% on 03/03/24. Medication adherence appears appropriate, aside from accidentally discarding insulin  therapy. BG has improved with a 7 day average of 238 mg/dL fasting. Tolerating Ozempic  without issue. Has been off insulin  for ~5 days. Will provide sample of insulin  today and enroll in PAP. Will also continue to titrate Ozempic  after she has completed her first 4 weeks. Given risk of hypoglycemia with concurrent insulin  and SFU use, will discontinue glipizide  at this time.  -Switched from basal insulin  Lantus  (insulin  glargine) to Tresiba  (insulin  degludec) 10 units daily via PAP. In the interim, provided samples of Toujeo  (insulin  glargine) at 10 units daily -Increased dose of GLP-1 Ozempic  (semaglutide ) to 0.5 mg weekly in two weeks on 08/31/24 -Discontinued glipizide  -Patient educated on purpose, proper use, and potential adverse effects of Ozempic  -Extensively discussed pathophysiology of diabetes, recommended lifestyle interventions, dietary effects on blood sugar control.  -Next A1c anticipated 08/2024.   ASCVD risk - primary prevention in patient with diabetes. Last LDL is 138 mg/dL, not at goal of <29 mg/dL. high intensity statin indicated.  -Continued rosuvastatin  40 mg daily.  -Continued ezetimibe  10 mg daily  Written patient instructions provided. Patient verbalized understanding of treatment plan.  Total time in face to face counseling 30 minutes.     Follow-up:  Pharmacist on 09/16/24 PCP clinic visit on 09/02/24  [name], PharmD Clinical Pharmacist [facility] [phone]

## 2024-09-23 NOTE — Telephone Encounter (Signed)
 Copied from CRM #[id number]. Topic: General - Other >> Sep 23, 2024  9:02 AM Wess RAMAN wrote: Reason for CRM: Patient would like to speak with Issac Peyton BRAVO, [facility] about her Continuous Glucose Sensor (FREESTYLE LIBRE 3 PLUS SENSOR) MISC  Callback #: [phone]

## 2024-09-23 NOTE — Progress Notes (Signed)
 S:    Reason for visit: ?  Julia Robertson is a 68 y.o. female with a history of diabetes (type 2), who presents today for a follow up diabetes pharmacotherapy visit.? Pertinent PMH also includes HTN, HLD, insomnia, and obesity.  Care Team: Primary Care Provider: Wellington Curtis LABOR, FNP  Patient presents today for re-education on the scanning process for [facility] Libre 3+ sensor. She reports she has enjoyed seeing her BG readings with her CGM device.   Current diabetes medications include: Ozempic  0.5 mg weekly, Tresiba  10 units daily  Previous diabetes medications include: Mounjaro  (cost), Trulicity  (cost), Januvia  (hairloss), metformin  (GI upset) Current hypertension medications include: amlodipine  10 mg daily, valsartan /hydrochlorothiazide  320/25 mg daily Current hyperlipidemia medications include: rosuvastatin  40 mg daily, ezetimibe  10 mg daily  Patient reports adherence to taking all medications as prescribed.   Have you been experiencing any side effects to the medications prescribed? No Do you have any problems obtaining medications due to transportation or finances? yes Insurance coverage: [facility] Medicare  Patient reported dietary habits: Eats 2 meals/day  Breakfast: cereal, sandwich Lunch/Dinner: protein, vegetables Drinks: water, rare soda/sweet tea *reports weakness of sweets (cakes) about 2-3x per week  Patient-reported exercise habits: walks 2 times a week in her neighborhood  DM Prevention:  Statin: Taking; high intensity.?  History of albuminuria? no Last eye exam: 03/10/24; cataract surgery in November Lab Results  Component Value Date   HMDIABEYEEXA  03/10/2024     Comment:     UNABLE TO DETERMINE   Last foot exam: No foot exam found Tobacco Use:   Tobacco Use: Low Risk  (09/14/2024)   Patient History    Smoking Tobacco Use: Never    Smokeless Tobacco Use: Never    Passive Exposure: Not on file   O:   Vitals:  Wt Readings from Last 3  Encounters:  09/14/24 234 lb 9.6 oz (106.4 kg)  09/08/24 234 lb 9.6 oz (106.4 kg)  09/02/24 232 lb 8 oz (105.5 kg)   BP Readings from Last 3 Encounters:  09/08/24 91/61  09/02/24 112/71  08/12/24 111/76   Pulse Readings from Last 3 Encounters:  09/08/24 76  09/02/24 79  08/12/24 81     Labs:?  Lab Results  Component Value Date   HGBA1C 9.3 (A) 09/02/2024   HGBA1C 10.4 (A) 06/02/2024   HGBA1C 11.1 (H) 03/03/2024   GLUCOSE 433 (H) 03/03/2024   MICRALBCREAT 9 09/02/2024   MICRALBCREAT 21 08/28/2023   MICRALBCREAT 6 02/18/2023   CREATININE 1.00 03/03/2024   CREATININE 0.71 12/03/2023   CREATININE 0.78 08/28/2023    Lab Results  Component Value Date   CHOL 233 (H) 03/03/2024   LDLCALC 138 (H) 03/03/2024   LDLCALC 65 12/03/2023   LDLCALC 131 (H) 08/28/2023   HDL 38 (L) 03/03/2024   TRIG 313 (H) 03/03/2024   TRIG 265 (H) 12/03/2023   TRIG 214 (H) 08/28/2023   ALT 34 (H) 03/03/2024   ALT 25 12/03/2023   AST 50 (H) 03/03/2024   AST 24 12/03/2023      Chemistry      Component Value Date/Time   NA 137 03/03/2024 1347   NA 138 09/23/2012 0547   K 3.8 03/03/2024 1347   K 4.1 09/23/2012 0547   CL 96 03/03/2024 1347   CL 102 09/23/2012 0547   CO2 23 03/03/2024 1347   CO2 27 09/23/2012 0547   BUN 11 03/03/2024 1347   BUN 14 09/23/2012 0547   CREATININE 1.00  03/03/2024 1347   CREATININE 0.92 09/23/2012 0547      Component Value Date/Time   CALCIUM  9.2 03/03/2024 1347   CALCIUM  8.8 09/23/2012 0547   ALKPHOS 77 03/03/2024 1347   AST 50 (H) 03/03/2024 1347   ALT 34 (H) 03/03/2024 1347   BILITOT 0.2 03/03/2024 1347       The 10-year ASCVD risk score (Arnett DK, et al., 2019) is: 11.8%  Lab Results  Component Value Date   MICRALBCREAT 9 09/02/2024   MICRALBCREAT 21 08/28/2023   MICRALBCREAT 6 02/18/2023    A/P: Diabetes currently uncontrolled with a most recent A1c of 9.3% on 09/02/24, which is down from 10.4% on 06/02/24. Medication adherence appears  appropriate. CGM report shows a GMI of 6.5%. Previously planned to increase Ozempic  next week. Given control, will discontinue insulin  therapy when Ozempic  dose is increased.  -Discontinued basal insulin  Tresiba  (insulin  degludec) when she starts Ozempic  1 mg -Increased dose of GLP-1 Ozempic  (semaglutide ) to 1 mg weekly next injection -Patient educated on purpose, proper use, and potential adverse effects of Ozempic  -Extensively discussed pathophysiology of diabetes, recommended lifestyle interventions, dietary effects on blood sugar control.  -Next A1c anticipated 08/2024.  -Continue monitoring BG via [employer] Libre 3+ sensors. Re-educated on placement and scanning process  ASCVD risk - primary prevention in patient with diabetes. Last LDL is 138 mg/dL, not at goal of <29 mg/dL. high intensity statin indicated.  -Continued rosuvastatin  40 mg daily.  -Continued ezetimibe  10 mg daily  Written patient instructions provided. Patient verbalized understanding of treatment plan.  Total time in face to face counseling 15 minutes.     Follow-up:  Pharmacist on 10/20/24 PCP clinic visit on 12/03/24  [name], PharmD Clinical Pharmacist [facility] [phone]

## 2024-09-30 ENCOUNTER — Encounter: Payer: Self-pay | Admitting: Family Medicine

## 2024-09-30 ENCOUNTER — Telehealth: Payer: Self-pay

## 2024-09-30 NOTE — Telephone Encounter (Signed)
 Called patient to inform her of the provider's advice regarding her dental procedure with being under anesthesia and she stated that she no longer needed the form to be filled out.  She decided that she was going to wait until the beginning of the year because she didn't want to go through the holidays with no teeth.  Stated we could discard the form.

## 2024-09-30 NOTE — Progress Notes (Signed)
 Received clearance for dental procedure. Okay to proceed. Please hold Ozempic  for one week prior to procedure. In interim continue checking blood glucose. If needed okay to restart Tresiba  (basal insulin ) 10 units daily while off Ozempic . May restart Ozempic  once procedure complete.   Form filled out and given to CMA to communicate plan with patient. Okay to fax order once done.

## 2024-10-08 ENCOUNTER — Telehealth: Payer: Self-pay

## 2024-10-08 NOTE — Telephone Encounter (Signed)
 Spoke with [facility] dental [facility] and oral spoke with [facility] and made aware regarding pre-op clearance and made aware pt reported to PCP she would be pushing surgery back as she wanted to wait after the holidays. Ihor documented information into pt chart.

## 2024-10-08 NOTE — Telephone Encounter (Signed)
 Spoke with pt and advised for her to call dentist to make aware she is pushing her surgery back. Pt then reported during call that she needs 1 of her tooth taken out soon and wanted us  to send over the form just to have that one tooth out. I advised pt she still needs to get in contact with the dentist office so they are made aware of her decisions.

## 2024-10-11 ENCOUNTER — Encounter: Payer: Self-pay | Admitting: *Deleted

## 2024-10-11 NOTE — Progress Notes (Signed)
 Julia Robertson                                          MRN: [id number]   10/11/2024   The [facility] Quality Team Specialist reviewed this patient medical record for the purposes of chart review for care gap closure. The following were reviewed: staff audit for quality review.    [facility]BCI Quality Team

## 2024-10-15 ENCOUNTER — Telehealth: Payer: Self-pay

## 2024-10-15 NOTE — Telephone Encounter (Signed)
 Received a refill re-order from from [employer](Ozempic ) filled and faxed to provider office to sign and date.

## 2024-10-19 NOTE — Telephone Encounter (Signed)
 Received provider portion [employer] (Ozempic ) Faxed to [employer] today.

## 2024-10-20 ENCOUNTER — Ambulatory Visit

## 2024-10-20 ENCOUNTER — Other Ambulatory Visit: Payer: Self-pay

## 2024-10-20 DIAGNOSIS — Z7985 Long-term (current) use of injectable non-insulin antidiabetic drugs: Secondary | ICD-10-CM | POA: Diagnosis not present

## 2024-10-20 DIAGNOSIS — E1159 Type 2 diabetes mellitus with other circulatory complications: Secondary | ICD-10-CM

## 2024-10-20 DIAGNOSIS — I152 Hypertension secondary to endocrine disorders: Secondary | ICD-10-CM | POA: Diagnosis not present

## 2024-10-20 DIAGNOSIS — E1169 Type 2 diabetes mellitus with other specified complication: Secondary | ICD-10-CM

## 2024-10-20 MED ORDER — SEMAGLUTIDE (1 MG/DOSE) 4 MG/3ML ~~LOC~~ SOPN
1.0000 mg | PEN_INJECTOR | SUBCUTANEOUS | 1 refills | Status: AC
  Filled 2024-10-20: qty 3, 28d supply, fill #0

## 2024-10-20 NOTE — Progress Notes (Signed)
 S:     Reason for visit: ?  Julia Robertson is a 68 y.o. female with a history of diabetes (type 2), who presents today for a follow up diabetes Face to Face pharmacotherapy visit.? Pertinent PMH also includes HTN, HLD, insomnia, and obesity.   Care Team: Primary Care Provider: Wellington Curtis LABOR, FNP (Inactive)  At last visit with clinical pharmacist on 09/23/24, patient was instructed to discontinue Tresiba  as GMI had improved with Ozempic  to 6.5%. Ozempic  was increased to 1 mg weekly  Today, patient reports her CGM meter continues to fall off after a few days. She would like to know if there are any adhesives to prevent this. She has not been wearing her sensor for about 4 weeks.   Current diabetes medications include: Ozempic  1 mg weekly Previous diabetes medications include: Tresiba  (glycemic control), Mounjaro  (cost), Trulicity  (cost), Januvia  (hairloss), metformin  (GI upset) Current hypertension medications include: amlodipine  10 mg daily, valsartan /hydrochlorothiazide  320/25 mg daily Current hyperlipidemia medications include: rosuvastatin  40 mg daily, ezetimibe  10 mg daily  Patient reports adherence to taking all medications as prescribed.   Have you been experiencing any side effects to the medications prescribed? No Do you have any problems obtaining medications due to transportation or finances? yes Insurance coverage: [employer] Medicare  Reported fasting BG readings: 110-130 mg/dL  Patient denies hypoglycemic events.  Patient reported dietary habits: Eats 2 meals/day  Breakfast: cereal, sandwich Lunch/Dinner: protein, vegetables Drinks: water, rare soda/sweet tea *reports weakness of sweets (cakes) about 2-3x per week   Patient-reported exercise habits: walks 2 times a week in her neighborhood DM Prevention:  Statin: Taking; high intensity.?  ACE/ARB: yes; valsartan  Last urinary albumin/creatinine ratio:  Lab Results  Component Value Date   MICRALBCREAT 9  09/02/2024   MICRALBCREAT 21 08/28/2023   MICRALBCREAT 6 02/18/2023   Last eye exam:  Lab Results  Component Value Date   HMDIABEYEEXA  03/10/2024     Comment:     UNABLE TO DETERMINE   Lab Results  Component Value Date   HMDIABEYEEXA  03/10/2024     Comment:     UNABLE TO DETERMINE   Last foot exam: No foot exam found Tobacco Use:  Tobacco Use: Low Risk  (09/14/2024)   Patient History    Smoking Tobacco Use: Never    Smokeless Tobacco Use: Never    Passive Exposure: Not on file   O:   Vitals:  Wt Readings from Last 3 Encounters:  09/14/24 234 lb 9.6 oz (106.4 kg)  09/08/24 234 lb 9.6 oz (106.4 kg)  09/02/24 232 lb 8 oz (105.5 kg)   BP Readings from Last 3 Encounters:  09/08/24 91/61  09/02/24 112/71  08/12/24 111/76   Pulse Readings from Last 3 Encounters:  09/08/24 76  09/02/24 79  08/12/24 81     Labs:?  Lab Results  Component Value Date   HGBA1C 9.3 (A) 09/02/2024   HGBA1C 10.4 (A) 06/02/2024   HGBA1C 11.1 (H) 03/03/2024   GLUCOSE 433 (H) 03/03/2024   MICRALBCREAT 9 09/02/2024   MICRALBCREAT 21 08/28/2023   MICRALBCREAT 6 02/18/2023   CREATININE 1.00 03/03/2024   CREATININE 0.71 12/03/2023   CREATININE 0.78 08/28/2023    Lab Results  Component Value Date   CHOL 233 (H) 03/03/2024   LDLCALC 138 (H) 03/03/2024   LDLCALC 65 12/03/2023   LDLCALC 131 (H) 08/28/2023   HDL 38 (L) 03/03/2024   TRIG 313 (H) 03/03/2024   TRIG 265 (H) 12/03/2023  TRIG 214 (H) 08/28/2023   ALT 34 (H) 03/03/2024   ALT 25 12/03/2023   AST 50 (H) 03/03/2024   AST 24 12/03/2023      Chemistry      Component Value Date/Time   NA 137 03/03/2024 1347   NA 138 09/23/2012 0547   K 3.8 03/03/2024 1347   K 4.1 09/23/2012 0547   CL 96 03/03/2024 1347   CL 102 09/23/2012 0547   CO2 23 03/03/2024 1347   CO2 27 09/23/2012 0547   BUN 11 03/03/2024 1347   BUN 14 09/23/2012 0547   CREATININE 1.00 03/03/2024 1347   CREATININE 0.92 09/23/2012 0547      Component Value  Date/Time   CALCIUM  9.2 03/03/2024 1347   CALCIUM  8.8 09/23/2012 0547   ALKPHOS 77 03/03/2024 1347   AST 50 (H) 03/03/2024 1347   ALT 34 (H) 03/03/2024 1347   BILITOT 0.2 03/03/2024 1347       The 10-year ASCVD risk score (Arnett DK, et al., 2019) is: 11.8%  Lab Results  Component Value Date   MICRALBCREAT 9 09/02/2024   MICRALBCREAT 21 08/28/2023   MICRALBCREAT 6 02/18/2023    A/P: Diabetes currently controlled with a most recent A1c of 9.3 on 09/02/24, which is down from 10.4% on 06/02/24. Reported fasting BG readings are at goal with a range of 110-130 mg/dL. Patient is able to verbalize appropriate hypoglycemia management plan. Medication adherence appears appropriate. Patient reports issues issues with CGM sensors falling off after a few days of placement.  -Continued GLP-1 Ozempic  (semaglutide ) 1 mg weekly.  -Counseled on CGM placement techniques. Counseled that FDA approved location is the back of the arm, however, off-label use on the stomach, if needed. Found adhesives with an open center online for patient to order.  -Extensively discussed pathophysiology of diabetes, recommended lifestyle interventions, dietary effects on blood sugar control.  -Counseled on s/sx of and management of hypoglycemia.  -Next A1c anticipated 11/2024.   ASCVD risk - primary prevention in patient with diabetes. Last LDL is 138 mg/dL, not at goal of <29 mg/dL. high intensity statin indicated. May discuss addition of bempedoic acid once new insurance is obtained in 2026.  -Continued rosuvastatin  40 mg daily.  -Continued ezetimibe  10 mg daily  Patient verbalized understanding of treatment plan. Total time patient counseling 30 minutes.  Follow-up:  Pharmacist on 12/22/24 PCP clinic visit on 12/03/24  [name], PharmD, CPP Clinical Pharmacist [facility] [phone]

## 2024-10-25 ENCOUNTER — Telehealth: Payer: Self-pay

## 2024-10-25 NOTE — Telephone Encounter (Signed)
 Pharmacy Quality Measure Review  This patient is appearing on a report for being at risk of failing the Glycemic Status Assessment in Diabetes measure this calendar year.    Last documented A1c 9.3% on 09/02/24. Last documented GMI 6.6% from 09/12/24-09/25/24        Able to close GSD gap based on GMI on LibreView report.   Taten Merrow E. Marsh, PharmD, CPP Clinical Pharmacist [facility] [phone]

## 2024-11-02 ENCOUNTER — Other Ambulatory Visit: Payer: Self-pay

## 2024-11-02 DIAGNOSIS — E1340 Other specified diabetes mellitus with diabetic neuropathy, unspecified: Secondary | ICD-10-CM

## 2024-11-02 DIAGNOSIS — M1612 Unilateral primary osteoarthritis, left hip: Secondary | ICD-10-CM

## 2024-11-02 NOTE — Telephone Encounter (Unsigned)
 Copied from CRM #[id number]. Topic: Clinical - Medication Refill >> Nov 02, 2024 11:16 AM Vanessa G wrote: Medication: meloxicam  (MOBIC ) 7.5 MG tablet gabapentin  (NEURONTIN ) 800 MG tablet  Has the patient contacted their pharmacy? Yes, referred to provider (Agent: If no, request that the patient contact the pharmacy for the refill. If patient does not wish to contact the pharmacy document the reason why and proceed with request.) (Agent: If yes, when and what did the pharmacy advise?)  This is the patient's preferred pharmacy:  [facility] [place] [facility] [address] [place] [address] Phone: [phone] Fax: [phone]  Is this the correct pharmacy for this prescription? Yes If no, delete pharmacy and type the correct one.   Has the prescription been filled recently? No  Is the patient out of the medication? Yes  Has the patient been seen for an appointment in the last year OR does the patient have an upcoming appointment? Yes  Can we respond through MyChart? No  Agent: Please be advised that Rx refills may take up to 3 business days. We ask that you follow-up with your pharmacy. Patient would like for refills to be completed before 11/05/24 as [place].

## 2024-11-03 ENCOUNTER — Inpatient Hospital Stay: Admission: RE | Admit: 2024-11-03 | Discharge: 2024-11-03 | Attending: Family Medicine

## 2024-11-03 ENCOUNTER — Ambulatory Visit: Payer: Self-pay

## 2024-11-03 DIAGNOSIS — Z13820 Encounter for screening for osteoporosis: Secondary | ICD-10-CM | POA: Insufficient documentation

## 2024-11-03 DIAGNOSIS — Z1231 Encounter for screening mammogram for malignant neoplasm of breast: Secondary | ICD-10-CM | POA: Diagnosis present

## 2024-11-03 NOTE — Progress Notes (Signed)
 Attempted call and pt stated she was not Julia Robertson and somehow 3 line was merged. Pt ended call. Was calling to advised results. Ok for E2C2 to advise pt.

## 2024-11-04 ENCOUNTER — Other Ambulatory Visit: Payer: Self-pay

## 2024-11-04 MED ORDER — MELOXICAM 7.5 MG PO TABS
7.5000 mg | ORAL_TABLET | Freq: Every day | ORAL | 0 refills | Status: AC
  Filled 2024-11-04: qty 30, 30d supply, fill #0

## 2024-11-04 NOTE — Telephone Encounter (Signed)
 Rx 09/02/24 #180 1RF- too soon Requested Prescriptions  Pending Prescriptions Disp Refills   gabapentin  (NEURONTIN ) 800 MG tablet 180 tablet 1    Sig: Take 2 tablets (1,600 mg total) by mouth at bedtime.     Neurology: Anticonvulsants - gabapentin  Passed - 11/04/2024 11:38 AM      Passed - Cr in normal range and within 360 days    Creatinine  Date Value Ref Range Status  09/23/2012 0.92 0.60 - 1.30 mg/dL Final   Creatinine, Ser  Date Value Ref Range Status  03/03/2024 1.00 0.57 - 1.00 mg/dL Final         Passed - Completed PHQ-2 or PHQ-9 in the last 360 days      Passed - Valid encounter within last 12 months    Recent Outpatient Visits           1 month ago Arthritis of left hip   [place] [facility] Simmons-Robinson, [place], MD   2 months ago Type 2 diabetes mellitus with other specified complication, without long-term current use of insulin  [facility])   [place] [facility] [place], Curtis A, FNP   2 months ago Type 2 diabetes mellitus with other specified complication, without long-term current use of insulin  [facility])   [place] [facility] Issac Keeling E, [facility]   3 months ago Type 2 diabetes mellitus with other specified complication, without long-term current use of insulin  [facility])   [place] [facility] Issac Keeling E, RPH   5 months ago Hypertension associated with diabetes [facility])   [place] [facility] [place], Kellie A, [place]               meloxicam  (MOBIC ) 7.5 MG tablet 30 tablet 0    Sig: Take 1 tablet (7.5 mg total) by mouth daily.     Analgesics:  COX2 Inhibitors Failed - 11/04/2024 11:38 AM      Failed - Manual Review: Labs are only required if the patient has taken medication for more than 8 weeks.      Failed - HGB in normal range and within 360 days    Hemoglobin  Date Value Ref Range Status  08/28/2023 12.7 11.1 - 15.9 g/dL Final         Failed - HCT in normal range  and within 360 days    Hematocrit  Date Value Ref Range Status  08/28/2023 40.6 34.0 - 46.6 % Final         Failed - AST in normal range and within 360 days    AST  Date Value Ref Range Status  03/03/2024 50 (H) 0 - 40 IU/L Final         Failed - ALT in normal range and within 360 days    ALT  Date Value Ref Range Status  03/03/2024 34 (H) 0 - 32 IU/L Final         Passed - Cr in normal range and within 360 days    Creatinine  Date Value Ref Range Status  09/23/2012 0.92 0.60 - 1.30 mg/dL Final   Creatinine, Ser  Date Value Ref Range Status  03/03/2024 1.00 0.57 - 1.00 mg/dL Final         Passed - eGFR is 30 or above and within 360 days    EGFR (African American)  Date Value Ref Range Status  09/23/2012 >60  Final   GFR calc Af Amer  Date Value Ref Range Status  12/28/2020  96 >59 mL/min/1.73 Final    Comment:    **In accordance with recommendations from the NKF-ASN Task force,**   [facility] is in the process of updating its eGFR calculation to the   2021 CKD-EPI creatinine equation that estimates kidney function   without a race variable.    EGFR (Non-African Amer.)  Date Value Ref Range Status  09/23/2012 >60  Final    Comment:    eGFR values <61mL/min/1.73 m2 may be an indication of chronic kidney disease (CKD). Calculated eGFR is useful in patients with stable renal function. The eGFR calculation will not be reliable in acutely ill patients when serum creatinine is changing rapidly. It is not useful in  patients on dialysis. The eGFR calculation may not be applicable to patients at the low and high extremes of body sizes, pregnant women, and vegetarians.    GFR calc non Af Amer  Date Value Ref Range Status  12/28/2020 83 >59 mL/min/1.73 Final   eGFR  Date Value Ref Range Status  03/03/2024 63 >59 mL/min/1.73 Final         Passed - Patient is not pregnant      Passed - Valid encounter within last 12 months    Recent Outpatient Visits           1  month ago Arthritis of left hip   [facility] Simmons-Robinson, [place], MD   2 months ago Type 2 diabetes mellitus with other specified complication, without long-term current use of insulin  (HCC)   [place] [facility] [place], Curtis A, FNP   2 months ago Type 2 diabetes mellitus with other specified complication, without long-term current use of insulin  [facility])   [place] [facility] Issac Peyton BRAVO, [facility]   3 months ago Type 2 diabetes mellitus with other specified complication, without long-term current use of insulin  [facility])   [place] [facility] [place] Peyton E, RPH   5 months ago Hypertension associated with diabetes [facility])   [facility] [place], Curtis LABOR, [place]

## 2024-11-04 NOTE — Telephone Encounter (Signed)
 Requested medication (s) are due for refill today - unsure  Requested medication (s) are on the active medication list -yes  Future visit scheduled -yes  Last refill: 09/09/24 #30  Notes to clinic: Acute visit Rx, fails partial lab protocol- over 1 year- 08/28/23, sent for review of request  Requested Prescriptions  Pending Prescriptions Disp Refills   meloxicam  (MOBIC ) 7.5 MG tablet 30 tablet 0    Sig: Take 1 tablet (7.5 mg total) by mouth daily.     Analgesics:  COX2 Inhibitors Failed - 11/04/2024 11:39 AM      Failed - Manual Review: Labs are only required if the patient has taken medication for more than 8 weeks.      Failed - HGB in normal range and within 360 days    Hemoglobin  Date Value Ref Range Status  08/28/2023 12.7 11.1 - 15.9 g/dL Final         Failed - HCT in normal range and within 360 days    Hematocrit  Date Value Ref Range Status  08/28/2023 40.6 34.0 - 46.6 % Final         Failed - AST in normal range and within 360 days    AST  Date Value Ref Range Status  03/03/2024 50 (H) 0 - 40 IU/L Final         Failed - ALT in normal range and within 360 days    ALT  Date Value Ref Range Status  03/03/2024 34 (H) 0 - 32 IU/L Final         Passed - Cr in normal range and within 360 days    Creatinine  Date Value Ref Range Status  09/23/2012 0.92 0.60 - 1.30 mg/dL Final   Creatinine, Ser  Date Value Ref Range Status  03/03/2024 1.00 0.57 - 1.00 mg/dL Final         Passed - eGFR is 30 or above and within 360 days    EGFR (African American)  Date Value Ref Range Status  09/23/2012 >60  Final   GFR calc Af Amer  Date Value Ref Range Status  12/28/2020 96 >59 mL/min/1.73 Final    Comment:    **In accordance with recommendations from the NKF-ASN Task force,**   [facility] is in the process of updating its eGFR calculation to the   2021 CKD-EPI creatinine equation that estimates kidney function   without a race variable.    EGFR (Non-African Amer.)   Date Value Ref Range Status  09/23/2012 >60  Final    Comment:    eGFR values <28mL/min/1.73 m2 may be an indication of chronic kidney disease (CKD). Calculated eGFR is useful in patients with stable renal function. The eGFR calculation will not be reliable in acutely ill patients when serum creatinine is changing rapidly. It is not useful in  patients on dialysis. The eGFR calculation may not be applicable to patients at the low and high extremes of body sizes, pregnant women, and vegetarians.    GFR calc non Af Amer  Date Value Ref Range Status  12/28/2020 83 >59 mL/min/1.73 Final   eGFR  Date Value Ref Range Status  03/03/2024 63 >59 mL/min/1.73 Final         Passed - Patient is not pregnant      Passed - Valid encounter within last 12 months    Recent Outpatient Visits           1 month ago Arthritis of left hip   [facility] Simmons-Robinson, [place], MD   2 months ago Type 2 diabetes mellitus with other specified complication, without long-term current use of insulin  [facility])   [place] [facility] [place], Curtis A, FNP   2 months ago Type 2 diabetes mellitus with other specified complication, without long-term current use of insulin  [facility])   [place] [facility] Issac Peyton BRAVO, [facility]   3 months ago Type 2 diabetes mellitus with other specified complication, without long-term current use of insulin  [facility])   [place] [facility] Issac Peyton BRAVO, RPH   5 months ago Hypertension associated with diabetes [facility])   [place] [facility] [place], Curtis LABOR, [place]              Refused Prescriptions Disp Refills   gabapentin  (NEURONTIN ) 800 MG tablet 180 tablet 1    Sig: Take 2 tablets (1,600 mg total) by mouth at bedtime.     Neurology: Anticonvulsants - gabapentin  Passed - 11/04/2024 11:39 AM      Passed - Cr in normal range and within 360 days    Creatinine  Date Value Ref  Range Status  09/23/2012 0.92 0.60 - 1.30 mg/dL Final   Creatinine, Ser  Date Value Ref Range Status  03/03/2024 1.00 0.57 - 1.00 mg/dL Final         Passed - Completed PHQ-2 or PHQ-9 in the last 360 days      Passed - Valid encounter within last 12 months    Recent Outpatient Visits           1 month ago Arthritis of left hip   [place] [facility] Simmons-Robinson, [place], MD   2 months ago Type 2 diabetes mellitus with other specified complication, without long-term current use of insulin  (HCC)   [place] [facility] [place], Curtis A, FNP   2 months ago Type 2 diabetes mellitus with other specified complication, without long-term current use of insulin  [facility])   [place] [facility] Issac Peyton E, RPH   3 months ago Type 2 diabetes mellitus with other specified complication, without long-term current use of insulin  [facility])    [facility] Issac Peyton E, RPH   5 months ago Hypertension associated with diabetes [facility])    [facility] [place], [place] A, [place]                 Requested Prescriptions  Pending Prescriptions Disp Refills   meloxicam  (MOBIC ) 7.5 MG tablet 30 tablet 0    Sig: Take 1 tablet (7.5 mg total) by mouth daily.     Analgesics:  COX2 Inhibitors Failed - 11/04/2024 11:39 AM      Failed - Manual Review: Labs are only required if the patient has taken medication for more than 8 weeks.      Failed - HGB in normal range and within 360 days    Hemoglobin  Date Value Ref Range Status  08/28/2023 12.7 11.1 - 15.9 g/dL Final         Failed - HCT in normal range and within 360 days    Hematocrit  Date Value Ref Range Status  08/28/2023 40.6 34.0 - 46.6 % Final         Failed - AST in normal range and within 360 days    AST  Date Value Ref Range Status  03/03/2024 50 (H) 0 - 40 IU/L Final  Failed - ALT in normal range and within 360 days     ALT  Date Value Ref Range Status  03/03/2024 34 (H) 0 - 32 IU/L Final         Passed - Cr in normal range and within 360 days    Creatinine  Date Value Ref Range Status  09/23/2012 0.92 0.60 - 1.30 mg/dL Final   Creatinine, Ser  Date Value Ref Range Status  03/03/2024 1.00 0.57 - 1.00 mg/dL Final         Passed - eGFR is 30 or above and within 360 days    EGFR (African American)  Date Value Ref Range Status  09/23/2012 >60  Final   GFR calc Af Amer  Date Value Ref Range Status  12/28/2020 96 >59 mL/min/1.73 Final    Comment:    **In accordance with recommendations from the NKF-ASN Task force,**   [facility] is in the process of updating its eGFR calculation to the   2021 CKD-EPI creatinine equation that estimates kidney function   without a race variable.    EGFR (Non-African Amer.)  Date Value Ref Range Status  09/23/2012 >60  Final    Comment:    eGFR values <21mL/min/1.73 m2 may be an indication of chronic kidney disease (CKD). Calculated eGFR is useful in patients with stable renal function. The eGFR calculation will not be reliable in acutely ill patients when serum creatinine is changing rapidly. It is not useful in  patients on dialysis. The eGFR calculation may not be applicable to patients at the low and high extremes of body sizes, pregnant women, and vegetarians.    GFR calc non Af Amer  Date Value Ref Range Status  12/28/2020 83 >59 mL/min/1.73 Final   eGFR  Date Value Ref Range Status  03/03/2024 63 >59 mL/min/1.73 Final         Passed - Patient is not pregnant      Passed - Valid encounter within last 12 months    Recent Outpatient Visits           1 month ago Arthritis of left hip   [place] [facility], [place], MD   2 months ago Type 2 diabetes mellitus with other specified complication, without long-term current use of insulin  (HCC)   [facility] [place], Curtis A, FNP   2  months ago Type 2 diabetes mellitus with other specified complication, without long-term current use of insulin  [facility])   [place] [facility], [facility]   3 months ago Type 2 diabetes mellitus with other specified complication, without long-term current use of insulin  [facility])   [place] [facility] Issac [place] E, RPH   5 months ago Hypertension associated with diabetes [facility])   [place] [facility], [place] A, [place]              Refused Prescriptions Disp Refills   gabapentin  (NEURONTIN ) 800 MG tablet 180 tablet 1    Sig: Take 2 tablets (1,600 mg total) by mouth at bedtime.     Neurology: Anticonvulsants - gabapentin  Passed - 11/04/2024 11:39 AM      Passed - Cr in normal range and within 360 days    Creatinine  Date Value Ref Range Status  09/23/2012 0.92 0.60 - 1.30 mg/dL Final   Creatinine, Ser  Date Value Ref Range Status  03/03/2024 1.00 0.57 - 1.00 mg/dL Final  Passed - Completed PHQ-2 or PHQ-9 in the last 360 days      Passed - Valid encounter within last 12 months    Recent Outpatient Visits           1 month ago Arthritis of left hip   [place] [facility] Simmons-Robinson, [place], MD   2 months ago Type 2 diabetes mellitus with other specified complication, without long-term current use of insulin  [facility])   [place] [facility] [place], Curtis A, FNP   2 months ago Type 2 diabetes mellitus with other specified complication, without long-term current use of insulin  [facility])   [place] [facility] Issac Peyton BRAVO, [facility]   3 months ago Type 2 diabetes mellitus with other specified complication, without long-term current use of insulin  [facility])   [place] [facility] Issac Peyton E, RPH   5 months ago Hypertension associated with diabetes [facility])   [facility], Curtis LABOR, [place]

## 2024-11-05 ENCOUNTER — Other Ambulatory Visit: Payer: Self-pay

## 2024-11-08 ENCOUNTER — Other Ambulatory Visit: Payer: Self-pay

## 2024-11-19 ENCOUNTER — Other Ambulatory Visit: Payer: Self-pay

## 2024-12-03 ENCOUNTER — Other Ambulatory Visit: Payer: Self-pay

## 2024-12-03 ENCOUNTER — Ambulatory Visit

## 2024-12-03 VITALS — BP 113/76 | HR 76 | Temp 98.3°F | Wt 231.7 lb

## 2024-12-03 DIAGNOSIS — E1340 Other specified diabetes mellitus with diabetic neuropathy, unspecified: Secondary | ICD-10-CM

## 2024-12-03 DIAGNOSIS — E66812 Obesity, class 2: Secondary | ICD-10-CM

## 2024-12-03 DIAGNOSIS — E1169 Type 2 diabetes mellitus with other specified complication: Secondary | ICD-10-CM | POA: Diagnosis not present

## 2024-12-03 DIAGNOSIS — I152 Hypertension secondary to endocrine disorders: Secondary | ICD-10-CM

## 2024-12-03 DIAGNOSIS — E1159 Type 2 diabetes mellitus with other circulatory complications: Secondary | ICD-10-CM

## 2024-12-03 DIAGNOSIS — E1140 Type 2 diabetes mellitus with diabetic neuropathy, unspecified: Secondary | ICD-10-CM | POA: Diagnosis not present

## 2024-12-03 LAB — POCT GLYCOSYLATED HEMOGLOBIN (HGB A1C): Hemoglobin A1C: 6.2 % — AB (ref 4.0–5.6)

## 2024-12-03 MED ORDER — GABAPENTIN 800 MG PO TABS
1600.0000 mg | ORAL_TABLET | Freq: Every day | ORAL | 3 refills | Status: AC
  Filled 2024-12-03: qty 180, 90d supply, fill #0

## 2024-12-03 NOTE — Assessment & Plan Note (Addendum)
 BMI 39. Comorbidities of T2DM, HTN. Patient has lost weight since last visit! Discussed eating a balanced diet and incorporating movement into daily routine. Continue Ozempic .  Wt Readings from Last 3 Encounters:  12/03/24 231 lb 11.2 oz (105.1 kg)  09/14/24 234 lb 9.6 oz (106.4 kg)  09/08/24 234 lb 9.6 oz (106.4 kg)

## 2024-12-03 NOTE — Assessment & Plan Note (Signed)
 Chronic, uncontrolled. Patient taking gabapentin  1600mg  daily for neuropathy management. Will recheck kidney function and consider increasing dose.

## 2024-12-03 NOTE — Assessment & Plan Note (Signed)
 Chronic, controlled. A1c is down to 6.2! Continues to take Ozempic  1mg  weekly.  - Continue current dose of Ozempic .  - Assisted patient in applying CGM sensor today, has been applying sensors on her stomach due to them falling off her arm within a few days.  - Follow up with clinical pharmacist - Follow up with me in 3 months.

## 2024-12-03 NOTE — Assessment & Plan Note (Addendum)
 Chronic, controlled. Goal < 130/80. Continue amlodipine  and Diovan . Will check BMP today.

## 2024-12-03 NOTE — Progress Notes (Signed)
 "     Established patient visit   Patient: Julia Robertson   DOB: Nov 14, 1956   69 y.o. Female  MRN: [id number] Visit Date: 12/03/2024  Today's healthcare provider: Isaiah DELENA Pepper, MD   Chief Complaint  Patient presents with   Diabetes Management Plan    3 month follow up   Subjective    HPI  Patient is overall doing well. Has been taking Ozempic  1mg  weekly. Has had trouble using her CGM. The sensors fall off her arm within 2 days.  T2DM- ozempic  1mg  weekly HTN- amlodipine , valsartan /hydrochlorothiazide   HLD- statin, ezetimibe   Medications: Show/hide medication list[1]  Review of Systems as noted in HPI.      Objective    BP 113/76   Pulse 76   Temp 98.3 F (36.8 C) (Oral)   Wt 231 lb 11.2 oz (105.1 kg)   SpO2 95%   BMI 39.77 kg/m    Physical Exam Constitutional:      Appearance: Normal appearance.  HENT:     Head: Normocephalic and atraumatic.     Mouth/Throat:     Mouth: Mucous membranes are moist.  Eyes:     Pupils: Pupils are equal, round, and reactive to light.  Pulmonary:     Effort: Pulmonary effort is normal.  Skin:    General: Skin is warm.  Neurological:     General: No focal deficit present.     Mental Status: She is alert.      Results for orders placed or performed in visit on 12/03/24  POCT glycosylated hemoglobin (Hb A1C)  Result Value Ref Range   Hemoglobin A1C 6.2 (A) 4.0 - 5.6 %   HbA1c POC (<> result, manual entry)     HbA1c, POC (prediabetic range)     HbA1c, POC (controlled diabetic range)      Assessment & Plan     Problem List Items Addressed This Visit       Cardiovascular and Mediastinum   Hypertension associated with diabetes (HCC)   Chronic, controlled. Goal < 130/80. Continue amlodipine  and Diovan . Will check BMP today.      Relevant Orders   Basic metabolic panel with GFR     Endocrine   Neuropathy due to secondary diabetes (HCC)   Chronic, uncontrolled. Patient taking gabapentin  1600mg  daily for  neuropathy management. Will recheck kidney function and consider increasing dose.      Relevant Medications   gabapentin  (NEURONTIN ) 800 MG tablet   Diabetes mellitus (HCC) - Primary   Chronic, controlled. A1c is down to 6.2! Continues to take Ozempic  1mg  weekly.  - Continue current dose of Ozempic .  - Assisted patient in applying CGM sensor today, has been applying sensors on her stomach due to them falling off her arm within a few days.  - Follow up with clinical pharmacist - Follow up with me in 3 months.       Relevant Orders   POCT glycosylated hemoglobin (Hb A1C) (Completed)     Other   Morbid obesity (HCC)   BMI 39. Comorbidities of T2DM, HTN. Patient has lost weight since last visit! Discussed eating a balanced diet and incorporating movement into daily routine. Continue Ozempic .  Wt Readings from Last 3 Encounters:  12/03/24 231 lb 11.2 oz (105.1 kg)  09/14/24 234 lb 9.6 oz (106.4 kg)  09/08/24 234 lb 9.6 oz (106.4 kg)           Return in about 3 months (around 03/03/2025) for Follow Up.  Isaiah DELENA Pepper, MD  [facility] [phone] (phone) [phone] (fax)     [1]  Outpatient Medications Prior to Visit  Medication Sig   amLODipine  (NORVASC ) 10 MG tablet Take 1 tablet (10 mg total) by mouth daily.   Blood Glucose Monitoring Suppl DEVI 1 each by Does not apply route in the morning, at noon, and at bedtime. May substitute to any manufacturer covered by patient's insurance.   Continuous Glucose Sensor (FREESTYLE LIBRE 3 PLUS SENSOR) MISC Change sensor every 15 days.   ezetimibe  (ZETIA ) 10 MG tablet Take 1 tablet (10 mg total) by mouth daily.   glucose blood test strip Use to check blood sugar up to 5 times daily   ibuprofen (ADVIL) 600 MG tablet Take 600 mg by mouth every 6 (six) hours as needed.   Insulin  Pen Needle 31G X 5 MM MISC Use as directed to inject insulin  once daily   Lancet Device MISC Use to check blood sugar up  to 5 times per day. May substitute to any manufacturer covered by patient's insurance.   meloxicam  (MOBIC ) 7.5 MG tablet Take 1 tablet (7.5 mg total) by mouth daily.   Multiple Vitamin (MULTIVITAMIN) capsule Take 1 capsule by mouth daily.   rosuvastatin  (CRESTOR ) 40 MG tablet Take 1 tablet (40 mg total) by mouth daily.   Semaglutide , 1 MG/DOSE, 4 MG/3ML SOPN Inject 1 mg into the skin once a week.   traZODone  (DESYREL ) 100 MG tablet Take 1 tablet (100 mg total) by mouth at bedtime.   valsartan -hydrochlorothiazide  (DIOVAN -HCT) 320-25 MG tablet Take 1 tablet by mouth daily.   [DISCONTINUED] gabapentin  (NEURONTIN ) 800 MG tablet Take 2 tablets (1,600 mg total) by mouth at bedtime.   No facility-administered medications prior to visit.   "

## 2024-12-04 LAB — BASIC METABOLIC PANEL WITH GFR
BUN/Creatinine Ratio: 9 — ABNORMAL LOW (ref 12–28)
BUN: 9 mg/dL (ref 8–27)
CO2: 28 mmol/L (ref 20–29)
Calcium: 9.9 mg/dL (ref 8.7–10.3)
Chloride: 98 mmol/L (ref 96–106)
Creatinine, Ser: 0.97 mg/dL (ref 0.57–1.00)
Glucose: 129 mg/dL — ABNORMAL HIGH (ref 70–99)
Potassium: 3.7 mmol/L (ref 3.5–5.2)
Sodium: 139 mmol/L (ref 134–144)
eGFR: 64 mL/min/1.73

## 2024-12-06 ENCOUNTER — Ambulatory Visit: Payer: Self-pay

## 2024-12-15 ENCOUNTER — Telehealth: Payer: Self-pay

## 2024-12-15 ENCOUNTER — Other Ambulatory Visit: Payer: Self-pay

## 2024-12-15 NOTE — Telephone Encounter (Signed)
 Copied from CRM #[id number]. Topic: Appointments - Scheduling Inquiry for Clinic >> Dec 15, 2024  2:07 PM Harlene ORN wrote: Reason for CRM: Patient called. She called her dentist for an appointment. Her PCP needs to fax over paperwork in regards to a physical? Please call back the patient to discuss further.

## 2024-12-15 NOTE — Telephone Encounter (Signed)
 Please call patient to ask what paperwork she needs.

## 2024-12-15 NOTE — Progress Notes (Signed)
 Received clearance for dental procedure. Okay to proceed. Please hold Ozempic  for one week prior to procedure. May restart Ozempic  once procedure complete.  Isaiah Pepper, MD [facility]

## 2024-12-15 NOTE — Telephone Encounter (Signed)
 Noted.

## 2024-12-17 NOTE — Progress Notes (Signed)
 Julia Robertson                                          MRN: [id number]   12/17/2024   The [facility] Quality Team Specialist reviewed this patient medical record for the purposes of chart review for care gap closure. The following were reviewed: chart review for care gap closure-glycemic status assessment.    [facility] Quality Team

## 2024-12-22 ENCOUNTER — Telehealth: Payer: Self-pay

## 2024-12-22 ENCOUNTER — Ambulatory Visit

## 2024-12-22 NOTE — Progress Notes (Unsigned)
 "  S:     Reason for visit: ?  Julia Robertson is a 69 y.o. female with a history of diabetes (type 2), who presents today for a follow up diabetes Face to Face pharmacotherapy visit.? Pertinent PMH also includes HTN, HLD, insomnia, and obesity.   Care Team: Primary Care Provider: Franchot Isaiah LABOR, MD  At last visit with clinical pharmacist on 09/23/24, patient was instructed to discontinue Tresiba  as GMI had improved with Ozempic  to 6.5%. Ozempic  was increased to 1 mg weekly  Today, patient reports her CGM meter continues to fall off after a few days. She would like to know if there are any adhesives to prevent this. She has not been wearing her sensor for about 4 weeks.   Current diabetes medications include: Ozempic  1 mg weekly Previous diabetes medications include: Tresiba  (glycemic control), Mounjaro  (cost), Trulicity  (cost), Januvia  (hairloss), metformin  (GI upset) Current hypertension medications include: amlodipine  10 mg daily, valsartan /hydrochlorothiazide  320/25 mg daily Current hyperlipidemia medications include: rosuvastatin  40 mg daily, ezetimibe  10 mg daily  Patient reports adherence to taking all medications as prescribed.   Have you been experiencing any side effects to the medications prescribed? No Do you have any problems obtaining medications due to transportation or finances? yes Insurance coverage: [employer] Medicare  Reported fasting BG readings: 110-130 mg/dL  Patient denies hypoglycemic events.  Patient reported dietary habits: Eats 2 meals/day  Breakfast: cereal, sandwich Lunch/Dinner: protein, vegetables Drinks: water, rare soda/sweet tea *reports weakness of sweets (cakes) about 2-3x per week   Patient-reported exercise habits: walks 2 times a week in her neighborhood DM Prevention:  Statin: Taking; high intensity.?  ACE/ARB: yes; valsartan  Last urinary albumin/creatinine ratio:  Lab Results  Component Value Date   MICRALBCREAT 9 09/02/2024    MICRALBCREAT 21 08/28/2023   MICRALBCREAT 6 02/18/2023   Last eye exam:  Lab Results  Component Value Date   HMDIABEYEEXA  03/10/2024     Comment:     UNABLE TO DETERMINE   Lab Results  Component Value Date   HMDIABEYEEXA  03/10/2024     Comment:     UNABLE TO DETERMINE   Last foot exam: No foot exam found Tobacco Use:  Tobacco Use: Low Risk (12/03/2024)   Patient History    Smoking Tobacco Use: Never    Smokeless Tobacco Use: Never    Passive Exposure: Not on file   O:  LibreView Report:   Vitals:  Wt Readings from Last 3 Encounters:  12/03/24 231 lb 11.2 oz (105.1 kg)  09/14/24 234 lb 9.6 oz (106.4 kg)  09/08/24 234 lb 9.6 oz (106.4 kg)   BP Readings from Last 3 Encounters:  12/03/24 113/76  09/08/24 91/61  09/02/24 112/71   Pulse Readings from Last 3 Encounters:  12/03/24 76  09/08/24 76  09/02/24 79     Labs:?  Lab Results  Component Value Date   HGBA1C 6.2 (A) 12/03/2024   HGBA1C 9.3 (A) 09/02/2024   HGBA1C 10.4 (A) 06/02/2024   GLUCOSE 129 (H) 12/03/2024   MICRALBCREAT 9 09/02/2024   MICRALBCREAT 21 08/28/2023   MICRALBCREAT 6 02/18/2023   CREATININE 0.97 12/03/2024   CREATININE 1.00 03/03/2024   CREATININE 0.71 12/03/2023    Lab Results  Component Value Date   CHOL 233 (H) 03/03/2024   LDLCALC 138 (H) 03/03/2024   LDLCALC 65 12/03/2023   LDLCALC 131 (H) 08/28/2023   HDL 38 (L) 03/03/2024   TRIG 313 (H) 03/03/2024   TRIG 265 (H) 12/03/2023  TRIG 214 (H) 08/28/2023   ALT 34 (H) 03/03/2024   ALT 25 12/03/2023   AST 50 (H) 03/03/2024   AST 24 12/03/2023      Chemistry      Component Value Date/Time   NA 139 12/03/2024 0912   NA 138 09/23/2012 0547   K 3.7 12/03/2024 0912   K 4.1 09/23/2012 0547   CL 98 12/03/2024 0912   CL 102 09/23/2012 0547   CO2 28 12/03/2024 0912   CO2 27 09/23/2012 0547   BUN 9 12/03/2024 0912   BUN 14 09/23/2012 0547   CREATININE 0.97 12/03/2024 0912   CREATININE 0.92 09/23/2012 0547      Component  Value Date/Time   CALCIUM  9.9 12/03/2024 0912   CALCIUM  8.8 09/23/2012 0547   ALKPHOS 77 03/03/2024 1347   AST 50 (H) 03/03/2024 1347   ALT 34 (H) 03/03/2024 1347   BILITOT 0.2 03/03/2024 1347       The 10-year ASCVD risk score (Arnett DK, et al., 2019) is: 20.1%  Lab Results  Component Value Date   MICRALBCREAT 9 09/02/2024   MICRALBCREAT 21 08/28/2023   MICRALBCREAT 6 02/18/2023    A/P: Diabetes currently controlled with a most recent A1c of 6.2% on 12/03/24, which is down from 9.3% on 09/02/24. Reported fasting BG readings are at goal with a range of 110-130 mg/dL. Patient is able to verbalize appropriate hypoglycemia management plan. Medication adherence appears appropriate. Patient reports issues issues with CGM sensors falling off after a few days of placement.  -Continued GLP-1 Ozempic  (semaglutide ) 1 mg weekly.  -Counseled on CGM placement techniques. Counseled that FDA approved location is the back of the arm, however, off-label use on the stomach, if needed. Found adhesives with an open center online for patient to order.  -Extensively discussed pathophysiology of diabetes, recommended lifestyle interventions, dietary effects on blood sugar control.  -Counseled on s/sx of and management of hypoglycemia.  -Next A1c anticipated 11/2024.   ASCVD risk - primary prevention in patient with diabetes. Last LDL is 138 mg/dL, not at goal of <29 mg/dL. high intensity statin indicated. May discuss addition of bempedoic acid once new insurance is obtained in 2026.  -Continued rosuvastatin  40 mg daily.  -Continued ezetimibe  10 mg daily  Patient verbalized understanding of treatment plan. Total time patient counseling 30 minutes.  Follow-up:  Pharmacist on 12/22/24 PCP clinic visit on 12/03/24  [name], PharmD, CPP Clinical Pharmacist [facility] [phone]   "

## 2024-12-22 NOTE — Telephone Encounter (Signed)
 Copied from CRM [id number]. Topic: Appointments - Scheduling Inquiry for Clinic >> Dec 22, 2024 11:44 AM Nessti S wrote: Reason for CRM: pt called to schedule another pharmacist appt.    ----------------------------------------------------------------------- From previous Reason for Contact - Scheduling: Patient/patient representative is calling to schedule an appointment. Refer to attachments for appointment information.

## 2024-12-28 ENCOUNTER — Ambulatory Visit

## 2024-12-28 VITALS — BP 120/72 | Ht 64.0 in | Wt 249.3 lb

## 2024-12-28 DIAGNOSIS — Z0000 Encounter for general adult medical examination without abnormal findings: Secondary | ICD-10-CM

## 2024-12-28 NOTE — Patient Instructions (Addendum)
 [name],  Thank you for taking the time for your Medicare Wellness Visit. I appreciate your continued commitment to your health goals. Please review the care plan we discussed, and feel free to reach out if I can assist you further.  Please note that Annual Wellness Visits do not include a physical exam. Some assessments may be limited, especially if the visit was conducted virtually. If needed, we may recommend an in-person follow-up with your provider.  Ongoing Care Seeing your primary care provider every 3 to 6 months helps us  monitor your health and provide consistent, personalized care. 03/03/25 @ 3:40 PM APPT W/ DR.CARTER  Referrals If a referral was made during today's visit and you haven't received any updates within two weeks, please contact the referred provider directly to check on the status.  Recommended Screenings:  Health Maintenance  Topic Date Due   Medicare Annual Wellness Visit  Never done   Eye exam for diabetics  10/15/2022   COVID-19 Vaccine (5 - 2025-26 season) 07/26/2024   Kidney health urinalysis for diabetes  03/03/2025   Complete foot exam   05/29/2025   Hemoglobin A1C  06/02/2025   Yearly kidney function blood test for diabetes  12/03/2025   Breast Cancer Screening  11/03/2026   Colon Cancer Screening  02/28/2028   Osteoporosis screening with Bone Density Scan  11/03/2029   DTaP/Tdap/Td vaccine (4 - Td or Tdap) 02/17/2033   Pneumococcal Vaccine for age over 60  Completed   Flu Shot  Completed   Hepatitis C Screening  Completed   Zoster (Shingles) Vaccine  Completed   Meningitis B Vaccine  Aged Out     Vision: Annual vision screenings are recommended for early detection of glaucoma, cataracts, and diabetic retinopathy. These exams can also reveal signs of chronic conditions such as diabetes and high blood pressure.  Dental: Annual dental screenings help detect early signs of oral cancer, gum disease, and other conditions linked to overall health,  including heart disease and diabetes.  Please see the attached documents for additional preventive care recommendations.   NEXT AWV 01/03/26 @ 3:50 PM IN PERSON

## 2025-01-06 ENCOUNTER — Ambulatory Visit

## 2025-03-03 ENCOUNTER — Ambulatory Visit

## 2026-01-03 ENCOUNTER — Ambulatory Visit
